# Patient Record
Sex: Female | Born: 1950 | Race: Black or African American | Hispanic: No | Smoking: Never smoker
Health system: Southern US, Community
[De-identification: ages and names within clinical notes are randomized; demographics above are authoritative.]

## PROBLEM LIST (undated history)

## (undated) DIAGNOSIS — D649 Anemia, unspecified: Secondary | ICD-10-CM

## (undated) DIAGNOSIS — B029 Zoster without complications: Secondary | ICD-10-CM

## (undated) DIAGNOSIS — M1990 Unspecified osteoarthritis, unspecified site: Secondary | ICD-10-CM

## (undated) DIAGNOSIS — K219 Gastro-esophageal reflux disease without esophagitis: Secondary | ICD-10-CM

## (undated) DIAGNOSIS — Z9889 Other specified postprocedural states: Secondary | ICD-10-CM

## (undated) DIAGNOSIS — E7800 Pure hypercholesterolemia, unspecified: Secondary | ICD-10-CM

## (undated) DIAGNOSIS — I447 Left bundle-branch block, unspecified: Secondary | ICD-10-CM

## (undated) DIAGNOSIS — I10 Essential (primary) hypertension: Secondary | ICD-10-CM

## (undated) DIAGNOSIS — R112 Nausea with vomiting, unspecified: Secondary | ICD-10-CM

## (undated) HISTORY — PX: CHOLECYSTECTOMY: SHX55

## (undated) HISTORY — PX: BACK SURGERY: SHX140

## (undated) HISTORY — PX: ABDOMINAL HYSTERECTOMY: SHX81

## (undated) HISTORY — PX: VASCULAR SURGERY: SHX849

## (undated) HISTORY — DX: Left bundle-branch block, unspecified: I44.7

---

## 1990-10-11 HISTORY — PX: VASCULAR SURGERY: SHX849

## 1993-10-11 HISTORY — PX: ABDOMINAL HYSTERECTOMY: SHX81

## 1998-10-11 HISTORY — PX: CHOLECYSTECTOMY: SHX55

## 2001-05-19 ENCOUNTER — Ambulatory Visit (HOSPITAL_COMMUNITY): Admission: RE | Admit: 2001-05-19 | Discharge: 2001-05-19 | Payer: Self-pay | Admitting: Obstetrics and Gynecology

## 2001-05-19 ENCOUNTER — Encounter: Payer: Self-pay | Admitting: Obstetrics and Gynecology

## 2001-07-23 ENCOUNTER — Emergency Department (HOSPITAL_COMMUNITY): Admission: EM | Admit: 2001-07-23 | Discharge: 2001-07-23 | Payer: Self-pay | Admitting: Internal Medicine

## 2002-06-22 ENCOUNTER — Ambulatory Visit (HOSPITAL_COMMUNITY): Admission: RE | Admit: 2002-06-22 | Discharge: 2002-06-22 | Payer: Self-pay | Admitting: Family Medicine

## 2002-06-22 ENCOUNTER — Encounter: Payer: Self-pay | Admitting: Family Medicine

## 2002-12-05 ENCOUNTER — Emergency Department (HOSPITAL_COMMUNITY): Admission: EM | Admit: 2002-12-05 | Discharge: 2002-12-05 | Payer: Self-pay | Admitting: Emergency Medicine

## 2003-06-28 ENCOUNTER — Ambulatory Visit (HOSPITAL_COMMUNITY): Admission: RE | Admit: 2003-06-28 | Discharge: 2003-06-28 | Payer: Self-pay | Admitting: Family Medicine

## 2004-07-06 ENCOUNTER — Ambulatory Visit (HOSPITAL_COMMUNITY): Admission: RE | Admit: 2004-07-06 | Discharge: 2004-07-06 | Payer: Self-pay | Admitting: Family Medicine

## 2005-09-27 ENCOUNTER — Ambulatory Visit (HOSPITAL_COMMUNITY): Admission: RE | Admit: 2005-09-27 | Discharge: 2005-09-27 | Payer: Self-pay | Admitting: Internal Medicine

## 2005-10-01 ENCOUNTER — Ambulatory Visit (HOSPITAL_COMMUNITY): Admission: RE | Admit: 2005-10-01 | Discharge: 2005-10-01 | Payer: Self-pay | Admitting: Internal Medicine

## 2005-10-25 ENCOUNTER — Ambulatory Visit (HOSPITAL_COMMUNITY): Admission: RE | Admit: 2005-10-25 | Discharge: 2005-10-26 | Payer: Self-pay | Admitting: Neurosurgery

## 2005-11-09 ENCOUNTER — Encounter (HOSPITAL_COMMUNITY): Admission: RE | Admit: 2005-11-09 | Discharge: 2005-12-09 | Payer: Self-pay | Admitting: Neurosurgery

## 2005-12-14 ENCOUNTER — Encounter (HOSPITAL_COMMUNITY): Admission: RE | Admit: 2005-12-14 | Discharge: 2006-01-13 | Payer: Self-pay | Admitting: Neurosurgery

## 2006-05-07 ENCOUNTER — Emergency Department (HOSPITAL_COMMUNITY): Admission: EM | Admit: 2006-05-07 | Discharge: 2006-05-07 | Payer: Self-pay | Admitting: Emergency Medicine

## 2006-08-08 ENCOUNTER — Ambulatory Visit (HOSPITAL_COMMUNITY): Admission: RE | Admit: 2006-08-08 | Discharge: 2006-08-08 | Payer: Self-pay | Admitting: Internal Medicine

## 2006-10-17 ENCOUNTER — Other Ambulatory Visit: Admission: RE | Admit: 2006-10-17 | Discharge: 2006-10-17 | Payer: Self-pay | Admitting: General Surgery

## 2006-10-18 ENCOUNTER — Ambulatory Visit (HOSPITAL_COMMUNITY): Admission: RE | Admit: 2006-10-18 | Discharge: 2006-10-18 | Payer: Self-pay | Admitting: Internal Medicine

## 2006-10-20 ENCOUNTER — Ambulatory Visit (HOSPITAL_COMMUNITY): Admission: RE | Admit: 2006-10-20 | Discharge: 2006-10-20 | Payer: Self-pay | Admitting: Orthopaedic Surgery

## 2006-12-22 ENCOUNTER — Other Ambulatory Visit: Admission: RE | Admit: 2006-12-22 | Discharge: 2006-12-22 | Payer: Self-pay | Admitting: General Surgery

## 2006-12-22 ENCOUNTER — Encounter (INDEPENDENT_AMBULATORY_CARE_PROVIDER_SITE_OTHER): Payer: Self-pay | Admitting: *Deleted

## 2007-01-27 ENCOUNTER — Ambulatory Visit (HOSPITAL_COMMUNITY): Admission: RE | Admit: 2007-01-27 | Discharge: 2007-01-27 | Payer: Self-pay | Admitting: Internal Medicine

## 2007-07-19 ENCOUNTER — Ambulatory Visit (HOSPITAL_COMMUNITY): Admission: RE | Admit: 2007-07-19 | Discharge: 2007-07-19 | Payer: Self-pay | Admitting: Orthopaedic Surgery

## 2007-11-16 ENCOUNTER — Ambulatory Visit (HOSPITAL_COMMUNITY): Admission: RE | Admit: 2007-11-16 | Discharge: 2007-11-16 | Payer: Self-pay | Admitting: Internal Medicine

## 2007-12-13 ENCOUNTER — Emergency Department (HOSPITAL_COMMUNITY): Admission: EM | Admit: 2007-12-13 | Discharge: 2007-12-13 | Payer: Self-pay | Admitting: Emergency Medicine

## 2008-05-17 ENCOUNTER — Emergency Department (HOSPITAL_COMMUNITY): Admission: EM | Admit: 2008-05-17 | Discharge: 2008-05-17 | Payer: Self-pay | Admitting: Emergency Medicine

## 2008-11-18 ENCOUNTER — Ambulatory Visit (HOSPITAL_COMMUNITY): Admission: RE | Admit: 2008-11-18 | Discharge: 2008-11-18 | Payer: Self-pay | Admitting: Internal Medicine

## 2009-04-30 ENCOUNTER — Encounter: Payer: Self-pay | Admitting: Gastroenterology

## 2009-05-08 ENCOUNTER — Ambulatory Visit: Payer: Self-pay | Admitting: Gastroenterology

## 2009-05-08 ENCOUNTER — Ambulatory Visit (HOSPITAL_COMMUNITY): Admission: RE | Admit: 2009-05-08 | Discharge: 2009-05-08 | Payer: Self-pay | Admitting: Gastroenterology

## 2009-05-08 HISTORY — PX: COLONOSCOPY: SHX174

## 2009-06-11 ENCOUNTER — Emergency Department (HOSPITAL_COMMUNITY): Admission: EM | Admit: 2009-06-11 | Discharge: 2009-06-11 | Payer: Self-pay | Admitting: Emergency Medicine

## 2009-11-20 ENCOUNTER — Ambulatory Visit (HOSPITAL_COMMUNITY)
Admission: RE | Admit: 2009-11-20 | Discharge: 2009-11-20 | Payer: Self-pay | Source: Home / Self Care | Admitting: Internal Medicine

## 2010-05-21 ENCOUNTER — Emergency Department (HOSPITAL_COMMUNITY): Admission: EM | Admit: 2010-05-21 | Discharge: 2010-05-22 | Payer: Self-pay | Admitting: Emergency Medicine

## 2010-10-30 ENCOUNTER — Other Ambulatory Visit (HOSPITAL_COMMUNITY): Payer: Self-pay | Admitting: Internal Medicine

## 2010-10-30 DIAGNOSIS — Z1239 Encounter for other screening for malignant neoplasm of breast: Secondary | ICD-10-CM

## 2010-11-23 ENCOUNTER — Ambulatory Visit (HOSPITAL_COMMUNITY)
Admission: RE | Admit: 2010-11-23 | Discharge: 2010-11-23 | Disposition: A | Discharge status: Home / Self Care | Payer: BC Managed Care – PPO | Source: Ambulatory Visit | Attending: Internal Medicine | Admitting: Internal Medicine

## 2010-11-23 DIAGNOSIS — Z1231 Encounter for screening mammogram for malignant neoplasm of breast: Secondary | ICD-10-CM | POA: Insufficient documentation

## 2010-11-23 DIAGNOSIS — Z1239 Encounter for other screening for malignant neoplasm of breast: Secondary | ICD-10-CM

## 2010-12-25 LAB — CBC
Hemoglobin: 12.1 g/dL (ref 12.0–15.0)
MCHC: 35.3 g/dL (ref 30.0–36.0)
RDW: 13.3 % (ref 11.5–15.5)
WBC: 5.9 10*3/uL (ref 4.0–10.5)

## 2010-12-25 LAB — DIFFERENTIAL
Basophils Absolute: 0 10*3/uL (ref 0.0–0.1)
Basophils Relative: 1 % (ref 0–1)
Neutro Abs: 2.6 10*3/uL (ref 1.7–7.7)
Neutrophils Relative %: 44 % (ref 43–77)

## 2010-12-25 LAB — URINALYSIS, ROUTINE W REFLEX MICROSCOPIC
Nitrite: NEGATIVE
Specific Gravity, Urine: 1.015 (ref 1.005–1.030)
Urobilinogen, UA: 0.2 mg/dL (ref 0.0–1.0)

## 2010-12-25 LAB — BASIC METABOLIC PANEL
BUN: 7 mg/dL (ref 6–23)
Calcium: 9.5 mg/dL (ref 8.4–10.5)
GFR calc non Af Amer: >60 mL/min (ref >60)
Glucose, Bld: 109 mg/dL — ABNORMAL HIGH (ref 70–99)
Sodium: 141 mEq/L (ref 135–145)

## 2011-02-23 NOTE — Op Note (Signed)
Jamie Hunt, Jamie Hunt               ACCOUNT NO.:  [id number]   MEDICAL RECORD NO.:  [id number]          PATIENT TYPE:  AMB   LOCATION:  DAY                           FACILITY:  [facility]   PHYSICIAN:  Kassie Mends, M.D.      DATE OF BIRTH:  1951/04/27   DATE OF PROCEDURE:  05/08/2009  DATE OF DISCHARGE:                               OPERATIVE REPORT   REFERRING PHYSICIAN:  [name] D. Felecia Shelling, MD   PROCEDURE:  Colonoscopy.   INDICATION FOR EXAM:  [name] is a 60 year old female, who presents  for average risk colon cancer screening.   FINDINGS:  1. A few scattered diverticula throughout the entire colon.  Small      internal hemorrhoids.  Otherwise, no polyps, masses, inflammatory      changes, or AVMs seen.  Normal retroflexed view of the rectum.   RECOMMENDATIONS:  1. Screening colonoscopy in 10 years.  2. She should follow a high-fiber diet.  She is given a handout on      high-fiber diet, diverticulosis, and hemorrhoids.   MEDICATIONS:  1. Demerol 75 mg IV.  2. Versed 4 mg IV.   PROCEDURE TECHNIQUE:  Physical exam was performed.  Informed consent was  obtained from the patient after explaining the benefits, risks, and  alternatives to the procedure.  The patient was connected to the monitor  and placed in left lateral position.  Continuous oxygen was provided by  nasal cannula.  IV medicine administered through an indwelling cannula.  After administration of sedation and rectal exam, the patient's rectum  was intubated  and scope was advanced under direct visualization to the cecum.  The  scope was removed slowly by carefully examining the color, texture,  anatomy, and integrity of the mucosa on the way out.  The patient was  recovered in endoscopy and discharged home in satisfactory condition.      Kassie Mends, M.D.  Electronically Signed     SM/MEDQ  D:  05/08/2009  T:  05/08/2009  Job:  [id number]   cc:   [name] D. Felecia Shelling, MD  Fax: [phone]

## 2011-02-26 NOTE — Op Note (Signed)
Jamie Hunt, Jamie Hunt NO.:  [id number]   MEDICAL RECORD NO.:  [id number]          PATIENT TYPE:  OIB   LOCATION:  3018                         FACILITY:  [facility]   PHYSICIAN:  Donalee Citrin, M.D.        DATE OF BIRTH:  1951-05-06   DATE OF PROCEDURE:  10/25/2005  DATE OF DISCHARGE:                                 OPERATIVE REPORT   PREOPERATIVE DIAGNOSIS:  Left S1 radiculopathy from a large ruptured disc L5-  S1, left.   POSTOPERATIVE DIAGNOSIS:  Left S1 radiculopathy from a large ruptured disc  L5-S1, left.   PROCEDURE:  Lumbar laminectomy and microdiscectomy at L5-S1, left,  microscopic dissection of the left S1 nerve root.   SURGEON:  Donalee Citrin, M.D.   ASSISTANT:  Tia Alert, MD   ANESTHESIA:  General endotracheal anesthesia.   HISTORY OF PRESENT ILLNESS:  The patient is a very pleasant 60 year old  female who has had long-standing back and left leg pain radiating down the  outside of her leg and bottom of her foot with numbness and tingling in the  same distribution.  Preoperative imaging showed a very large ruptured disc  at L5-S1 compressing the left S1 nerve root.  The patient has failed  conservative treatment and is recommended laminectomy and microdiscectomy.  I discussed the risks and benefits of the surgery which she understood and  agreed to proceed forth.   DESCRIPTION OF PROCEDURE:  The patient was brought to the operating room,  given general anesthesia, positioned prone on the Wilson frame.  The back  was prepped and draped in the usual sterile fashion.  After a preop x-ray  localized the L5-S1 disc space and infiltrated with 10 mL of lidocaine with  epinephrine, a midline incision was made.  Bovie electrocautery was used to  dissect through the subcutaneous tissue and subperiosteal dissection was  carried out at the lamina of L5 and S1 on the left side.  A self-retaining  retractor was placed.  Interoperative x-ray confirmed localization  of the  appropriate level.  Then, the inferior aspect of L5, medial facet complex,  and superior aspect of the lamina of S1 was removed in a piecemeal fashion.  The ligamentum flavum was identified and also removed in a piecemeal fashion  exposing the thecal sac and proximal S1 nerve root.  At this point, the  operating microscope was draped and brought onto the field.  Under  microscopic illumination, the S1 nerve root was dissected free and mobilized  by underbiting the lateral gutter and the lateral ligament.  Then, part of  the root of the foramen was underbitten down the S1 lamina and the S1  pedicle was identified.  Using a nerve hook and a Penfield 4, the S1 nerve  root was dissected off a very large fragment of disc still contained  partially within the ligament and reflected medially.  Then, the annulotomy  was made and fragments removed that had migrated outside the disc space.  The disc space was entered, the disc was noted to be markedly degenerated  and collapsed.  Several large fragments were removed from the central  compartment and lateral compartment using a downgoing Epstein curet.  The  endplates were scraped.  Additional disc fragments were removed until the  disc was empty.  No further stenosis was appreciated at the end of the  discectomy and there were no other disc fragments appreciated and the S1  nerve root was freely mobilized and mobile after exploration with the  coronary dilator.  The wound was copiously irrigated and meticulous  hemostasis was maintained.  Gelfoam was overlaid on top of the dura.  The  muscle and fascia were  reapproximated with interrupted Vicryl and the skin was closed with running  4-0 subcuticular.  Benzoin and Steri-Strips were applied.  The patient went  to the recovery room in stable condition.  At the end of the case, the  needle, sponge, and instrument counts were correct.           ______________________________  Donalee Citrin,  M.D.     GC/MEDQ  D:  10/25/2005  T:  10/25/2005  Job:  [id number]

## 2011-07-05 LAB — URINALYSIS, ROUTINE W REFLEX MICROSCOPIC
Protein, ur: NEGATIVE
Urobilinogen, UA: 0.2

## 2011-07-09 LAB — DIFFERENTIAL
Basophils Relative: 0
Monocytes Relative: 7
Neutro Abs: 5.4
Neutrophils Relative %: 73

## 2011-07-09 LAB — BASIC METABOLIC PANEL
CO2: 29
Calcium: 9.4
Creatinine, Ser: 0.89
GFR calc Af Amer: >60

## 2011-07-09 LAB — CBC
MCHC: 34.4
RBC: 3.54 — ABNORMAL LOW
WBC: 7.4

## 2011-07-22 LAB — POCT I-STAT CREATININE
Creatinine, Ser: 0.9
Operator id: 218581

## 2011-10-27 ENCOUNTER — Other Ambulatory Visit (HOSPITAL_COMMUNITY): Payer: Self-pay | Admitting: Internal Medicine

## 2011-10-27 DIAGNOSIS — Z139 Encounter for screening, unspecified: Secondary | ICD-10-CM

## 2011-11-29 ENCOUNTER — Ambulatory Visit (HOSPITAL_COMMUNITY)
Admission: RE | Admit: 2011-11-29 | Discharge: 2011-11-29 | Disposition: A | Discharge status: Home / Self Care | Payer: BC Managed Care – PPO | Source: Ambulatory Visit | Attending: Internal Medicine | Admitting: Internal Medicine

## 2011-11-29 DIAGNOSIS — Z1231 Encounter for screening mammogram for malignant neoplasm of breast: Secondary | ICD-10-CM | POA: Insufficient documentation

## 2011-11-29 DIAGNOSIS — Z139 Encounter for screening, unspecified: Secondary | ICD-10-CM

## 2011-11-30 ENCOUNTER — Emergency Department (HOSPITAL_COMMUNITY)
Admission: EM | Admit: 2011-11-30 | Discharge: 2011-11-30 | Disposition: A | Discharge status: Home / Self Care | Payer: BC Managed Care – PPO | Attending: Emergency Medicine | Admitting: Emergency Medicine

## 2011-11-30 ENCOUNTER — Encounter (HOSPITAL_COMMUNITY): Payer: Self-pay

## 2011-11-30 DIAGNOSIS — I10 Essential (primary) hypertension: Secondary | ICD-10-CM | POA: Insufficient documentation

## 2011-11-30 DIAGNOSIS — K529 Noninfective gastroenteritis and colitis, unspecified: Secondary | ICD-10-CM

## 2011-11-30 DIAGNOSIS — E7800 Pure hypercholesterolemia, unspecified: Secondary | ICD-10-CM | POA: Insufficient documentation

## 2011-11-30 DIAGNOSIS — M129 Arthropathy, unspecified: Secondary | ICD-10-CM | POA: Insufficient documentation

## 2011-11-30 DIAGNOSIS — K5289 Other specified noninfective gastroenteritis and colitis: Secondary | ICD-10-CM | POA: Insufficient documentation

## 2011-11-30 HISTORY — DX: Unspecified osteoarthritis, unspecified site: M19.90

## 2011-11-30 HISTORY — DX: Essential (primary) hypertension: I10

## 2011-11-30 HISTORY — DX: Zoster without complications: B02.9

## 2011-11-30 HISTORY — DX: Pure hypercholesterolemia, unspecified: E78.00

## 2011-11-30 LAB — BASIC METABOLIC PANEL
BUN: 15 mg/dL (ref 6–23)
CO2: 25 mEq/L (ref 19–32)
Chloride: 105 mEq/L (ref 96–112)
Creatinine, Ser: 0.79 mg/dL (ref 0.50–1.10)
Glucose, Bld: 108 mg/dL — ABNORMAL HIGH (ref 70–99)
Potassium: 3.6 mEq/L (ref 3.5–5.1)

## 2011-11-30 LAB — DIFFERENTIAL
Basophils Relative: 0 % (ref 0–1)
Lymphocytes Relative: 8 % — ABNORMAL LOW (ref 12–46)
Lymphs Abs: 0.6 10*3/uL — ABNORMAL LOW (ref 0.7–4.0)
Monocytes Absolute: 0.4 10*3/uL (ref 0.1–1.0)
Monocytes Relative: 5 % (ref 3–12)
Neutro Abs: 6.7 10*3/uL (ref 1.7–7.7)
Neutrophils Relative %: 87 % — ABNORMAL HIGH (ref 43–77)

## 2011-11-30 LAB — CBC
HCT: 36.6 % (ref 36.0–46.0)
Hemoglobin: 13.1 g/dL (ref 12.0–15.0)
MCHC: 35.8 g/dL (ref 30.0–36.0)
RBC: 3.89 MIL/uL (ref 3.87–5.11)

## 2011-11-30 LAB — URINE MICROSCOPIC-ADD ON

## 2011-11-30 LAB — URINALYSIS, ROUTINE W REFLEX MICROSCOPIC
Bilirubin Urine: NEGATIVE
Glucose, UA: NEGATIVE mg/dL
Ketones, ur: NEGATIVE mg/dL
Specific Gravity, Urine: 1.02 (ref 1.005–1.030)
pH: 6 (ref 5.0–8.0)

## 2011-11-30 MED ORDER — ONDANSETRON HCL 4 MG/2ML IJ SOLN
4.0000 mg | Freq: Once | INTRAMUSCULAR | Status: AC
  Administered 2011-11-30: 4 mg via INTRAVENOUS
  Filled 2011-11-30: qty 2

## 2011-11-30 MED ORDER — SODIUM CHLORIDE 0.9 % IV SOLN: Freq: Once | INTRAVENOUS | Status: DC

## 2011-11-30 MED ORDER — SODIUM CHLORIDE 0.9 % IV BOLUS (SEPSIS)
1000.0000 mL | Freq: Once | INTRAVENOUS | Status: AC
  Administered 2011-11-30: 1000 mL via INTRAVENOUS

## 2011-11-30 MED ORDER — LOPERAMIDE HCL 2 MG PO CAPS
4.0000 mg | ORAL_CAPSULE | Freq: Once | ORAL | Status: AC
  Administered 2011-11-30: 4 mg via ORAL
  Filled 2011-11-30: qty 2

## 2011-11-30 MED ORDER — METOCLOPRAMIDE HCL 10 MG PO TABS: 10.0000 mg | ORAL_TABLET | Freq: Four times a day (QID) | ORAL | Status: AC | PRN

## 2011-11-30 NOTE — ED Notes (Signed)
Awaiting IVF bolus to complete prior to discharge. Patient aware. Ginger ale provided to patient per request. Denies any other needs at this time.

## 2011-11-30 NOTE — ED Provider Notes (Signed)
History     CSN: [id number]  Arrival date & time 11/30/11  3244   First MD Initiated Contact with Patient 11/30/11 [phone]      Chief Complaint  Patient presents with  . Emesis  . Diarrhea    (Consider location/radiation/quality/duration/timing/severity/associated sxs/prior treatment) Patient is a 61 y.o. female presenting with vomiting and diarrhea. The history is provided by the patient.  Emesis  Associated symptoms include diarrhea.  Diarrhea The primary symptoms include vomiting and diarrhea.  She started having vomiting and diarrhea at midnight. She has vomited 3 times and has had multiple watery bowel movements. There've been no fever, chills, and sweats. She has had crampy upper abdominal pain which is temporarily relieved by vomiting or moving her bowels. Pain was 6/10 at its worst and is 0/10 currently. Next is symptoms better nothing makes them worse. She was not aware of any sick contacts and has not aware of eating any tainted food. She describes her symptoms as severe.  Past Medical History  Diagnosis Date  . Hypertension   . Arthritis   . High cholesterol   . Shingles     Past Surgical History  Procedure Date  . Abdominal hysterectomy   . Back surgery   . Cholecystectomy   . Vascular surgery     No family history on file.  History  Substance Use Topics  . Smoking status: Never Smoker   . Smokeless tobacco: Not on file  . Alcohol Use: No    OB History    Grav Para Term Preterm Abortions TAB SAB Ect Mult Living                  Review of Systems  Gastrointestinal: Positive for vomiting and diarrhea.  All other systems reviewed and are negative.    Allergies  Penicillins  Home Medications   Current Outpatient Rx  Name Route Sig Dispense Refill  . FAMCICLOVIR 250 MG PO TABS Oral Take 250 mg by mouth 2 (two) times daily.    Marland Kitchen FEXOFENADINE HCL 180 MG PO TABS Oral Take 180 mg by mouth daily.    Marland Kitchen HYDROCHLOROTHIAZIDE 25 MG PO TABS Oral Take 25  mg by mouth daily.    Marland Kitchen HYDROCODONE-ACETAMINOPHEN 7.5-325 MG PO TABS Oral Take 1 tablet by mouth 2 (two) times daily as needed.    Marland Kitchen LOVASTATIN 20 MG PO TABS Oral Take 20 mg by mouth at bedtime.    Marland Kitchen NABUMETONE 750 MG PO TABS Oral Take 750 mg by mouth 2 (two) times daily.    Marland Kitchen OMEPRAZOLE 20 MG PO CPDR Oral Take 20 mg by mouth daily.    . QUINAPRIL HCL 40 MG PO TABS Oral Take 40 mg by mouth at bedtime.    Marland Kitchen VERAPAMIL HCL 120 MG PO TABS Oral Take 240 mg by mouth daily.      BP 137/84  Pulse 94  Temp(Src) 98.3 F (36.8 C) (Oral)  Resp 16  Ht 5\' 10"  (1.778 m)  Wt 213 lb (96.616 kg)  BMI 30.56 kg/m2  SpO2 98%  Physical Exam  Nursing note and vitals reviewed.  61 year old female is resting comfortably and in no acute distress. Vital signs are normal. Oxygen saturation is 98% which is normal. Head is normocephalic and atraumatic. PERRLA, EOMI. Oropharynx is clear mucous membranes are moist. Neck is nontender and supple without adenopathy or JVD. Back is nontender. There's no CVA tenderness. Lungs are clear without rales, wheezes, or rhonchi. Heart has regular rate and rhythm  without murmur. Abdomen is soft, flat, with mild tenderness in the epigastric area. There is no rebound or guarding. There is no masses or hepatosplenomegaly. Bowel sounds are diminished. Extremities have no cyanosis or edema, full range of motion is present. Skin is warm and dry without rash. Neurologic: Mental status is normal, cranial nerves are intact, there no focal motor or sensory deficits.  ED Course  Procedures (including critical care time)  Results for orders placed during the hospital encounter of 11/30/11  CBC      Component Value Range   WBC 7.7  4.0 - 10.5 (K/uL)   RBC 3.89  3.87 - 5.11 (MIL/uL)   Hemoglobin 13.1  12.0 - 15.0 (g/dL)   HCT 16.1  09.6 - 04.5 (%)   MCV 94.1  78.0 - 100.0 (fL)   MCH 33.7  26.0 - 34.0 (pg)   MCHC 35.8  30.0 - 36.0 (g/dL)   RDW 40.9  81.1 - 91.4 (%)   Platelets 238  150 -  400 (K/uL)  DIFFERENTIAL      Component Value Range   Neutrophils Relative 87 (*) 43 - 77 (%)   Neutro Abs 6.7  1.7 - 7.7 (K/uL)   Lymphocytes Relative 8 (*) 12 - 46 (%)   Lymphs Abs 0.6 (*) 0.7 - 4.0 (K/uL)   Monocytes Relative 5  3 - 12 (%)   Monocytes Absolute 0.4  0.1 - 1.0 (K/uL)   Eosinophils Relative 0  0 - 5 (%)   Eosinophils Absolute 0.0  0.0 - 0.7 (K/uL)   Basophils Relative 0  0 - 1 (%)   Basophils Absolute 0.0  0.0 - 0.1 (K/uL)  BASIC METABOLIC PANEL      Component Value Range   Sodium 142  135 - 145 (mEq/L)   Potassium 3.6  3.5 - 5.1 (mEq/L)   Chloride 105  96 - 112 (mEq/L)   CO2 25  19 - 32 (mEq/L)   Glucose, Bld 108 (*) 70 - 99 (mg/dL)   BUN 15  6 - 23 (mg/dL)   Creatinine, Ser 7.82  0.50 - 1.10 (mg/dL)   Calcium 9.8  8.4 - 95.6 (mg/dL)   GFR calc non Af Amer 89 (*) >90 (mL/min)   GFR calc Af Amer >90  >90 (mL/min)  URINALYSIS, ROUTINE W REFLEX MICROSCOPIC      Component Value Range   Color, Urine YELLOW  YELLOW    APPearance CLEAR  CLEAR    Specific Gravity, Urine 1.020  1.005 - 1.030    pH 6.0  5.0 - 8.0    Glucose, UA NEGATIVE  NEGATIVE (mg/dL)   Hgb urine dipstick NEGATIVE  NEGATIVE    Bilirubin Urine NEGATIVE  NEGATIVE    Ketones, ur NEGATIVE  NEGATIVE (mg/dL)   Protein, ur NEGATIVE  NEGATIVE (mg/dL)   Urobilinogen, UA 0.2  0.0 - 1.0 (mg/dL)   Nitrite NEGATIVE  NEGATIVE    Leukocytes, UA SMALL (*) NEGATIVE   URINE MICROSCOPIC-ADD ON      Component Value Range   Squamous Epithelial / LPF RARE  RARE    WBC, UA 3-6  <3 (WBC/hpf)   She feels much better after IV hydration and IV Zofran. Shows received oral loperamide. She will be sent home with a prescription for metoclopramide and is advised to take oral loperamide as needed for diarrhea.  1. Gastroenteritis       MDM  Vomiting and diarrhea which most likely represents a viral gastroenteritis. She will be given  IV hydration, IV ondansetron, and oral loperamide.      Started having vomiting  and diarrhea  Dione Booze, MD 11/30/11 [phone]

## 2011-11-30 NOTE — ED Notes (Signed)
Patient with no complaints at this time. Respirations even and unlabored. Skin warm/dry. Discharge instructions reviewed with patient at this time. Patient given opportunity to voice concerns/ask questions. IV removed per policy and band-aid applied to site. Patient discharged at this time and left Emergency Department with steady gait.  

## 2011-11-30 NOTE — Discharge Instructions (Signed)
Take Imodium AD as needed for diarrhea.  Nausea and Vomiting Nausea is a sick feeling that often comes before throwing up (vomiting). Vomiting is a reflex where stomach contents come out of your mouth. Vomiting can cause severe loss of body fluids (dehydration). Children and elderly adults can become dehydrated quickly, especially if they also have diarrhea. Nausea and vomiting are symptoms of a condition or disease. It is important to find the cause of your symptoms. CAUSES   Direct irritation of the stomach lining. This irritation can result from increased acid production (gastroesophageal reflux disease), infection, food poisoning, taking certain medicines (such as nonsteroidal anti-inflammatory drugs), alcohol use, or tobacco use.   Signals from the brain.These signals could be caused by a headache, heat exposure, an inner ear disturbance, increased pressure in the brain from injury, infection, a tumor, or a concussion, pain, emotional stimulus, or metabolic problems.   An obstruction in the gastrointestinal tract (bowel obstruction).   Illnesses such as diabetes, hepatitis, gallbladder problems, appendicitis, kidney problems, cancer, sepsis, atypical symptoms of a heart attack, or eating disorders.   Medical treatments such as chemotherapy and radiation.   Receiving medicine that makes you sleep (general anesthetic) during surgery.  DIAGNOSIS Your caregiver may ask for tests to be done if the problems do not improve after a few days. Tests may also be done if symptoms are severe or if the reason for the nausea and vomiting is not clear. Tests may include:  Urine tests.   Blood tests.   Stool tests.   Cultures (to look for evidence of infection).   X-rays or other imaging studies.  Test results can help your caregiver make decisions about treatment or the need for additional tests. TREATMENT You need to stay well hydrated. Drink frequently but in small amounts.You may wish to  drink water, sports drinks, clear broth, or eat frozen ice pops or gelatin dessert to help stay hydrated.When you eat, eating slowly may help prevent nausea.There are also some antinausea medicines that may help prevent nausea. HOME CARE INSTRUCTIONS   Take all medicine as directed by your caregiver.   If you do not have an appetite, do not force yourself to eat. However, you must continue to drink fluids.   If you have an appetite, eat a normal diet unless your caregiver tells you differently.   Eat a variety of complex carbohydrates (rice, wheat, potatoes, bread), lean meats, yogurt, fruits, and vegetables.   Avoid high-fat foods because they are more difficult to digest.   Drink enough water and fluids to keep your urine clear or pale yellow.   If you are dehydrated, ask your caregiver for specific rehydration instructions. Signs of dehydration may include:   Severe thirst.   Dry lips and mouth.   Dizziness.   Dark urine.   Decreasing urine frequency and amount.   Confusion.   Rapid breathing or pulse.  SEEK IMMEDIATE MEDICAL CARE IF:   You have blood or brown flecks (like coffee grounds) in your vomit.   You have black or bloody stools.   You have a severe headache or stiff neck.   You are confused.   You have severe abdominal pain.   You have chest pain or trouble breathing.   You do not urinate at least once every 8 hours.   You develop cold or clammy skin.   You continue to vomit for longer than 24 to 48 hours.   You have a fever.  MAKE SURE YOU:  Understand these instructions.   Will watch your condition.   Will get help right away if you are not doing well or get worse.  Document Released: 09/27/2005 Document Revised: 06/09/2011 Document Reviewed: 02/24/2011 [facility] Patient Information 2012 [place], [place].  Diarrhea Infections caused by germs (bacterial) or a virus commonly cause diarrhea. Your caregiver has determined that with time, rest  and fluids, the diarrhea should improve. In general, eat normally while drinking more water than usual. Although water may prevent dehydration, it does not contain salt and minerals (electrolytes). Broths, weak tea without caffeine and oral rehydration solutions (ORS) replace fluids and electrolytes. Small amounts of fluids should be taken frequently. Large amounts at one time may not be tolerated. Plain water may be harmful in infants and the elderly. Oral rehydrating solutions (ORS) are available at pharmacies and grocery stores. ORS replace water and important electrolytes in proper proportions. Sports drinks are not as effective as ORS and may be harmful due to sugars worsening diarrhea.  ORS is especially recommended for use in children with diarrhea. As a general guideline for children, replace any new fluid losses from diarrhea and/or vomiting with ORS as follows:   If your child weighs 22 pounds or under (10 kg or less), give 60-120 mL ( -  cup or 2 - 4 ounces) of ORS for each episode of diarrheal stool or vomiting episode.   If your child weighs more than 22 pounds (more than 10 kgs), give 120-240 mL ( - 1 cup or 4 - 8 ounces) of ORS for each diarrheal stool or episode of vomiting.   While correcting for dehydration, children should eat normally. However, foods high in sugar should be avoided because this may worsen diarrhea. Large amounts of carbonated soft drinks, juice, gelatin desserts and other highly sugared drinks should be avoided.   After correction of dehydration, other liquids that are appealing to the child may be added. Children should drink small amounts of fluids frequently and fluids should be increased as tolerated. Children should drink enough fluids to keep urine clear or pale yellow.   Adults should eat normally while drinking more fluids than usual. Drink small amounts of fluids frequently and increase as tolerated. Drink enough fluids to keep urine clear or pale yellow.  Broths, weak decaffeinated tea, lemon lime soft drinks (allowed to go flat) and ORS replace fluids and electrolytes.   Avoid:   Carbonated drinks.   Juice.   Extremely hot or cold fluids.   Caffeine drinks.   Fatty, greasy foods.   Alcohol.   Tobacco.   Too much intake of anything at one time.   Gelatin desserts.   Probiotics are active cultures of beneficial bacteria. They may lessen the amount and number of diarrheal stools in adults. Probiotics can be found in yogurt with active cultures and in supplements.   Wash hands well to avoid spreading bacteria and virus.   Anti-diarrheal medications are not recommended for infants and children.   Only take over-the-counter or prescription medicines for pain, discomfort or fever as directed by your caregiver. Do not give aspirin to children because it may cause Reye's Syndrome.   For adults, ask your caregiver if you should continue all prescribed and over-the-counter medicines.   If your caregiver has given you a follow-up appointment, it is very important to keep that appointment. Not keeping the appointment could result in a chronic or permanent injury, and disability. If there is any problem keeping the appointment, you must call back  to this facility for assistance.  SEEK IMMEDIATE MEDICAL CARE IF:   You or your child is unable to keep fluids down or other symptoms or problems become worse in spite of treatment.   Vomiting or diarrhea develops and becomes persistent.   There is vomiting of blood or bile (green material).   There is blood in the stool or the stools are black and tarry.   There is no urine output in 6-8 hours or there is only a small amount of very dark urine.   Abdominal pain develops, increases or localizes.   You have a fever.   Your baby is older than 3 months with a rectal temperature of 102 F (38.9 C) or higher.   Your baby is 18 months old or younger with a rectal temperature of 100.4 F (38  C) or higher.   You or your child develops excessive weakness, dizziness, fainting or extreme thirst.   You or your child develops a rash, stiff neck, severe headache or become irritable or sleepy and difficult to awaken.  MAKE SURE YOU:   Understand these instructions.   Will watch your condition.   Will get help right away if you are not doing well or get worse.  Document Released: 09/17/2002 Document Revised: 06/09/2011 Document Reviewed: 08/04/2009 [facility] Patient Information 2012 [place], [place].  Metoclopramide tablets What is this medicine? METOCLOPRAMIDE (met oh kloe PRA mide) is used to treat the symptoms of gastroesophageal reflux disease (GERD) like heartburn. It is also used to treat people with slow emptying of the stomach and intestinal tract. This medicine may be used for other purposes; ask your health care provider or pharmacist if you have questions. What should I tell my health care provider before I take this medicine? They need to know if you have any of these conditions: -breast cancer -depression -diabetes -heart failure -high blood pressure -kidney disease -liver disease -Parkinson's disease or a movement disorder -pheochromocytoma -seizures -stomach obstruction, bleeding, or perforation -an unusual or allergic reaction to metoclopramide, procainamide, sulfites, other medicines, foods, dyes, or preservatives -pregnant or trying to get pregnant -breast-feeding How should I use this medicine? Take this medicine by mouth with a glass of water. Follow the directions on the prescription label. Take this medicine on an empty stomach, about 30 minutes before eating. Take your doses at regular intervals. Do not take your medicine more often than directed. Do not stop taking except on the advice of your doctor or health care professional. A special MedGuide will be given to you by the pharmacist with each prescription and refill. Be sure to read this information  carefully each time. Talk to your pediatrician regarding the use of this medicine in children. Special care may be needed. Overdosage: If you think you have taken too much of this medicine contact a poison control center or emergency room at once. NOTE: This medicine is only for you. Do not share this medicine with others. What if I miss a dose? If you miss a dose, take it as soon as you can. If it is almost time for your next dose, take only that dose. Do not take double or extra doses. What may interact with this medicine? -acetaminophen -cyclosporine -digoxin -medicines for blood pressure -medicines for diabetes, including insulin -medicines for hay fever and other allergies -medicines for depression, especially an Monoamine Oxidase Inhibitor (MAOI) -medicines for Parkinson's disease, like levodopa -medicines for sleep or for pain -tetracycline This list may not describe all possible interactions. Give  your health care provider a list of all the medicines, herbs, non-prescription drugs, or dietary supplements you use. Also tell them if you smoke, drink alcohol, or use illegal drugs. Some items may interact with your medicine. What should I watch for while using this medicine? It may take a few weeks for your stomach condition to start to get better. However, do not take this medicine for longer than 12 weeks. The longer you take this medicine, and the more you take it, the greater your chances are of developing serious side effects. If you are an elderly patient, a female patient, or you have diabetes, you may be at an increased risk for side effects from this medicine. Contact your doctor immediately if you start having movements you cannot control such as lip smacking, rapid movements of the tongue, involuntary or uncontrollable movements of the eyes, head, arms and legs, or muscle twitches and spasms. Patients and their families should watch out for worsening depression or thoughts of  suicide. Also watch out for any sudden or severe changes in feelings such as feeling anxious, agitated, panicky, irritable, hostile, aggressive, impulsive, severely restless, overly excited and hyperactive, or not being able to sleep. If this happens, especially at the beginning of treatment or after a change in dose, call your doctor. Do not treat yourself for high fever. Ask your doctor or health care professional for advice. You may get drowsy or dizzy. Do not drive, use machinery, or do anything that needs mental alertness until you know how this drug affects you. Do not stand or sit up quickly, especially if you are an older patient. This reduces the risk of dizzy or fainting spells. Alcohol can make you more drowsy and dizzy. Avoid alcoholic drinks. What side effects may I notice from receiving this medicine? Side effects that you should report to your doctor or health care professional as soon as possible: -allergic reactions like skin rash, itching or hives, swelling of the face, lips, or tongue -abnormal production of milk in females -breast enlargement in both males and females -change in the way you walk -difficulty moving, speaking or swallowing -drooling, lip smacking, or rapid movements of the tongue -excessive sweating -fever -involuntary or uncontrollable movements of the eyes, head, arms and legs -irregular heartbeat or palpitations -muscle twitches and spasms -unusually weak or tired Side effects that usually do not require medical attention (report to your doctor or health care professional if they continue or are bothersome): -change in sex drive or performance -depressed mood -diarrhea -difficulty sleeping -headache -menstrual changes -restless or nervous This list may not describe all possible side effects. Call your doctor for medical advice about side effects. You may report side effects to FDA at 1-800-FDA-1088. Where should I keep my medicine? Keep out of the  reach of children. Store at room temperature between 20 and 25 degrees C (68 and 77 degrees F). Protect from light. Keep container tightly closed. Throw away any unused medicine after the expiration date. NOTE: This sheet is a summary. It may not cover all possible information. If you have questions about this medicine, talk to your doctor, pharmacist, or health care provider.  2012, [employer]. (05/22/2008 4:30:05 PM)

## 2011-11-30 NOTE — ED Notes (Signed)
Nausea, vomiting, diarrhea that started around midnight per pt. Having some abdominal pain per pt.

## 2012-10-11 HISTORY — PX: BACK SURGERY: SHX140

## 2012-11-01 ENCOUNTER — Other Ambulatory Visit (HOSPITAL_COMMUNITY): Payer: Self-pay | Admitting: Internal Medicine

## 2012-11-01 DIAGNOSIS — Z139 Encounter for screening, unspecified: Secondary | ICD-10-CM

## 2012-11-19 ENCOUNTER — Encounter (HOSPITAL_COMMUNITY): Payer: Self-pay

## 2012-11-19 ENCOUNTER — Emergency Department (HOSPITAL_COMMUNITY): Payer: BC Managed Care – PPO

## 2012-11-19 ENCOUNTER — Emergency Department (HOSPITAL_COMMUNITY)
Admission: EM | Admit: 2012-11-19 | Discharge: 2012-11-19 | Disposition: A | Discharge status: Home / Self Care | Payer: BC Managed Care – PPO | Attending: Emergency Medicine | Admitting: Emergency Medicine

## 2012-11-19 DIAGNOSIS — E7800 Pure hypercholesterolemia, unspecified: Secondary | ICD-10-CM | POA: Insufficient documentation

## 2012-11-19 DIAGNOSIS — Z8619 Personal history of other infectious and parasitic diseases: Secondary | ICD-10-CM | POA: Insufficient documentation

## 2012-11-19 DIAGNOSIS — Z8739 Personal history of other diseases of the musculoskeletal system and connective tissue: Secondary | ICD-10-CM | POA: Insufficient documentation

## 2012-11-19 DIAGNOSIS — Z79899 Other long term (current) drug therapy: Secondary | ICD-10-CM | POA: Insufficient documentation

## 2012-11-19 DIAGNOSIS — I10 Essential (primary) hypertension: Secondary | ICD-10-CM | POA: Insufficient documentation

## 2012-11-19 DIAGNOSIS — K219 Gastro-esophageal reflux disease without esophagitis: Secondary | ICD-10-CM | POA: Insufficient documentation

## 2012-11-19 DIAGNOSIS — R079 Chest pain, unspecified: Secondary | ICD-10-CM | POA: Insufficient documentation

## 2012-11-19 HISTORY — DX: Gastro-esophageal reflux disease without esophagitis: K21.9

## 2012-11-19 LAB — CBC WITH DIFFERENTIAL/PLATELET
Basophils Absolute: 0 10*3/uL (ref 0.0–0.1)
Eosinophils Relative: 2 % (ref 0–5)
HCT: 36.1 % (ref 36.0–46.0)
Lymphocytes Relative: 54 % — ABNORMAL HIGH (ref 12–46)
Lymphs Abs: 4.4 10*3/uL — ABNORMAL HIGH (ref 0.7–4.0)
MCV: 95.5 fL (ref 78.0–100.0)
Monocytes Absolute: 0.6 10*3/uL (ref 0.1–1.0)
Neutro Abs: 2.9 10*3/uL (ref 1.7–7.7)
Platelets: 258 10*3/uL (ref 150–400)
RBC: 3.78 MIL/uL — ABNORMAL LOW (ref 3.87–5.11)
RDW: 12.8 % (ref 11.5–15.5)
WBC: 8.1 10*3/uL (ref 4.0–10.5)

## 2012-11-19 LAB — BASIC METABOLIC PANEL
CO2: 26 mEq/L (ref 19–32)
Calcium: 9.4 mg/dL (ref 8.4–10.5)
Chloride: 103 mEq/L (ref 96–112)
Glucose, Bld: 89 mg/dL (ref 70–99)
Sodium: 140 mEq/L (ref 135–145)

## 2012-11-19 LAB — TROPONIN I: Troponin I: <0.3 ng/mL (ref <0.30)

## 2012-11-19 MED ORDER — TRAMADOL HCL 50 MG PO TABS: 50.0000 mg | ORAL_TABLET | Freq: Four times a day (QID) | ORAL | Status: DC | PRN

## 2012-11-19 NOTE — ED Provider Notes (Signed)
History     CSN: [id number]  Arrival date & time 11/19/12  1252   First MD Initiated Contact with Patient 11/19/12 1321      Chief Complaint  Patient presents with  . Chest Pain    (Consider location/radiation/quality/duration/timing/severity/associated sxs/prior treatment) Patient is a 62 y.o. female presenting with chest pain. The history is provided by the patient (the pt complains of left side chest pain with movement). No language interpreter was used.  Chest Pain Pain location:  L chest Pain quality: aching   Pain radiates to:  Does not radiate Pain radiates to the back: no   Pain severity:  Moderate Onset quality:  Gradual Timing:  Intermittent Progression:  Waxing and waning Chronicity:  New Context: not breathing   Associated symptoms: no abdominal pain, no back pain, no cough, no fatigue and no headache     Past Medical History  Diagnosis Date  . Hypertension   . Arthritis   . High cholesterol   . Shingles   . Acid reflux     Past Surgical History  Procedure Laterality Date  . Abdominal hysterectomy    . Back surgery    . Cholecystectomy    . Vascular surgery      No family history on file.  History  Substance Use Topics  . Smoking status: Never Smoker   . Smokeless tobacco: Not on file  . Alcohol Use: No    OB History   Grav Para Term Preterm Abortions TAB SAB Ect Mult Living                  Review of Systems  Constitutional: Negative for fatigue.  HENT: Negative for congestion, sinus pressure and ear discharge.   Eyes: Negative for discharge.  Respiratory: Negative for cough.   Cardiovascular: Positive for chest pain.  Gastrointestinal: Negative for abdominal pain and diarrhea.  Genitourinary: Negative for frequency and hematuria.  Musculoskeletal: Negative for back pain.  Skin: Negative for rash.  Neurological: Negative for seizures and headaches.  Psychiatric/Behavioral: Negative for hallucinations.    Allergies   Penicillins  Home Medications   Current Outpatient Rx  Name  Route  Sig  Dispense  Refill  . famciclovir (FAMVIR) 250 MG tablet   Oral   Take 250 mg by mouth 2 (two) times daily.         . fexofenadine (ALLEGRA) 180 MG tablet   Oral   Take 180 mg by mouth daily.         . hydrALAZINE (APRESOLINE) 25 MG tablet   Oral   Take 25 mg by mouth 2 (two) times daily.         . hydrochlorothiazide (HYDRODIURIL) 25 MG tablet   Oral   Take 25 mg by mouth daily.         Marland Kitchen HYDROcodone-acetaminophen (NORCO) 7.5-325 MG per tablet   Oral   Take 1 tablet by mouth 2 (two) times daily as needed for pain.          Marland Kitchen lovastatin (MEVACOR) 20 MG tablet   Oral   Take 20 mg by mouth at bedtime.         . nabumetone (RELAFEN) 750 MG tablet   Oral   Take 750 mg by mouth 2 (two) times daily.         Marland Kitchen omeprazole (PRILOSEC) 20 MG capsule   Oral   Take 20 mg by mouth daily.         . quinapril (ACCUPRIL)  40 MG tablet   Oral   Take 40 mg by mouth daily.          . verapamil (VERELAN PM) 240 MG 24 hr capsule   Oral   Take 240 mg by mouth daily.         . traMADol (ULTRAM) 50 MG tablet   Oral   Take 1 tablet (50 mg total) by mouth every 6 (six) hours as needed for pain.   20 tablet   0     BP 154/86  Pulse 87  Temp(Src) 98.7 F (37.1 C) (Oral)  Resp 18  Ht 5\' 11"  (1.803 m)  Wt 220 lb (99.791 kg)  BMI 30.7 kg/m2  SpO2 99%  Physical Exam  Constitutional: She is oriented to person, place, and time. She appears well-developed.  HENT:  Head: Normocephalic and atraumatic.  Eyes: Conjunctivae and EOM are normal. No scleral icterus.  Neck: Neck supple. No thyromegaly present.  Cardiovascular: Normal rate and regular rhythm.  Exam reveals no gallop and no friction rub.   No murmur heard. Pulmonary/Chest: No stridor. She has no wheezes. She has no rales. She exhibits tenderness.  Tender left chest  Abdominal: She exhibits no distension. There is no tenderness.  There is no rebound.  Musculoskeletal: Normal range of motion. She exhibits no edema.  Lymphadenopathy:    She has no cervical adenopathy.  Neurological: She is oriented to person, place, and time. Coordination normal.  Skin: No rash noted. No erythema.  Psychiatric: She has a normal mood and affect. Her behavior is normal.    ED Course  Procedures (including critical care time)  Labs Reviewed  CBC WITH DIFFERENTIAL - Abnormal; Notable for the following:    RBC 3.78 (*)    MCH 34.1 (*)    Neutrophils Relative 36 (*)    Lymphocytes Relative 54 (*)    Lymphs Abs 4.4 (*)    All other components within normal limits  BASIC METABOLIC PANEL - Abnormal; Notable for the following:    Potassium 3.2 (*)    GFR calc non Af Amer 71 (*)    GFR calc Af Amer 82 (*)    All other components within normal limits  TROPONIN I   Dg Chest 2 View  11/19/2012  *RADIOLOGY REPORT*  Clinical Data: Chest pain  CHEST - 2 VIEW  Comparison: Chest x-ray of 01/27/2007  Findings: No active infiltrate or effusion is seen.  Mediastinal contours are stable.  Mild cardiomegaly is stable.  No acute skeletal abnormality is seen.  IMPRESSION: Stable chest x-ray.  No active lung disease.   Original Report Authenticated By: Dwyane Dee, M.D.      1. Chest pain      Date: 11/19/2012  Rate: 86  Rhythm: sinus tachycardia  QRS Axis: normal  Intervals: normal  ST/T Wave abnormalities: nonspecific ST changes  Conduction Disutrbances:none  Narrative Interpretation:   Old EKG Reviewed: unchanged    MDM          Benny Lennert, MD 11/19/12 1520

## 2012-11-19 NOTE — ED Notes (Signed)
Pt presents to er with c/o left side chest pain, pt states that started having chest pains two days ago, at first the chest pain was intermittent, today pain has been more constant and radiates down left arm, nausea, pt reports that pt feels "heavy"

## 2012-11-19 NOTE — ED Notes (Signed)
Pt reports mid-sternal cp off/on for last 2 days, today woke with cp at 6am and had been constant since pain has gotten worse today and radiates to left arm. Arm feels heavy. And has b=periods of nausea. Denies any increased stress. No weakness. Denies any smoking.

## 2012-11-19 NOTE — ED Notes (Signed)
Pt also reports that she has had a cough that is productive with white sputum and thinks that she may have had a fever this am,

## 2012-11-19 NOTE — ED Notes (Signed)
Upon returning from xray, pt reports that her chest pain increases with movement

## 2012-11-30 ENCOUNTER — Ambulatory Visit (HOSPITAL_COMMUNITY)
Admission: RE | Admit: 2012-11-30 | Discharge: 2012-11-30 | Disposition: A | Discharge status: Home / Self Care | Payer: BC Managed Care – PPO | Source: Ambulatory Visit | Attending: Internal Medicine | Admitting: Internal Medicine

## 2012-11-30 DIAGNOSIS — Z1231 Encounter for screening mammogram for malignant neoplasm of breast: Secondary | ICD-10-CM | POA: Insufficient documentation

## 2013-11-06 ENCOUNTER — Other Ambulatory Visit (HOSPITAL_COMMUNITY): Payer: Self-pay | Admitting: Internal Medicine

## 2013-11-06 DIAGNOSIS — Z139 Encounter for screening, unspecified: Secondary | ICD-10-CM

## 2013-12-03 ENCOUNTER — Ambulatory Visit (HOSPITAL_COMMUNITY)
Admission: RE | Admit: 2013-12-03 | Discharge: 2013-12-03 | Disposition: A | Discharge status: Home / Self Care | Payer: BC Managed Care – PPO | Source: Ambulatory Visit | Attending: Internal Medicine | Admitting: Internal Medicine

## 2013-12-03 DIAGNOSIS — Z1231 Encounter for screening mammogram for malignant neoplasm of breast: Secondary | ICD-10-CM | POA: Insufficient documentation

## 2013-12-03 DIAGNOSIS — Z139 Encounter for screening, unspecified: Secondary | ICD-10-CM

## 2014-04-22 ENCOUNTER — Emergency Department (HOSPITAL_COMMUNITY)
Admission: EM | Admit: 2014-04-22 | Discharge: 2014-04-22 | Disposition: A | Discharge status: Home / Self Care | Payer: BC Managed Care – PPO | Attending: Emergency Medicine | Admitting: Emergency Medicine

## 2014-04-22 ENCOUNTER — Encounter (HOSPITAL_COMMUNITY): Payer: Self-pay | Admitting: Emergency Medicine

## 2014-04-22 DIAGNOSIS — IMO0002 Reserved for concepts with insufficient information to code with codable children: Secondary | ICD-10-CM | POA: Insufficient documentation

## 2014-04-22 DIAGNOSIS — E7800 Pure hypercholesterolemia, unspecified: Secondary | ICD-10-CM | POA: Insufficient documentation

## 2014-04-22 DIAGNOSIS — I10 Essential (primary) hypertension: Secondary | ICD-10-CM | POA: Insufficient documentation

## 2014-04-22 DIAGNOSIS — S60511A Abrasion of right hand, initial encounter: Secondary | ICD-10-CM

## 2014-04-22 DIAGNOSIS — Z79899 Other long term (current) drug therapy: Secondary | ICD-10-CM | POA: Insufficient documentation

## 2014-04-22 DIAGNOSIS — K219 Gastro-esophageal reflux disease without esophagitis: Secondary | ICD-10-CM | POA: Insufficient documentation

## 2014-04-22 DIAGNOSIS — Y9289 Other specified places as the place of occurrence of the external cause: Secondary | ICD-10-CM | POA: Insufficient documentation

## 2014-04-22 DIAGNOSIS — Y9389 Activity, other specified: Secondary | ICD-10-CM | POA: Insufficient documentation

## 2014-04-22 DIAGNOSIS — M129 Arthropathy, unspecified: Secondary | ICD-10-CM | POA: Insufficient documentation

## 2014-04-22 DIAGNOSIS — Z8619 Personal history of other infectious and parasitic diseases: Secondary | ICD-10-CM | POA: Insufficient documentation

## 2014-04-22 DIAGNOSIS — Z880 Allergy status to penicillin: Secondary | ICD-10-CM | POA: Insufficient documentation

## 2014-04-22 DIAGNOSIS — W268XXA Contact with other sharp object(s), not elsewhere classified, initial encounter: Secondary | ICD-10-CM | POA: Insufficient documentation

## 2014-04-22 DIAGNOSIS — Z23 Encounter for immunization: Secondary | ICD-10-CM | POA: Insufficient documentation

## 2014-04-22 MED ORDER — TETANUS-DIPHTH-ACELL PERTUSSIS 5-2.5-18.5 LF-MCG/0.5 IM SUSP
0.5000 mL | Freq: Once | INTRAMUSCULAR | Status: AC
  Administered 2014-04-22: 0.5 mL via INTRAMUSCULAR
  Filled 2014-04-22: qty 0.5

## 2014-04-22 MED ORDER — CIPROFLOXACIN HCL 500 MG PO TABS: 500.0000 mg | ORAL_TABLET | Freq: Two times a day (BID) | ORAL | Status: DC

## 2014-04-22 NOTE — Discharge Instructions (Signed)
Please wash her hands with soap and water, pat dry, and apply a Band-Aid daily until the wound is healed. Please use Cipro 2 times daily with food. Position, or return to the emergency department if any signs of infection. Abrasions An abrasion is a cut or scrape of the skin. Abrasions do not go through all layers of the skin. HOME CARE  If a bandage (dressing) was put on your wound, change it as told by your doctor. If the bandage sticks, soak it off with warm.  Wash the area with water and soap 2 times a day. Rinse off the soap. Pat the area dry with a clean towel.  Put on medicated cream (ointment) as told by your doctor.  Change your bandage right away if it gets wet or dirty.  Only take medicine as told by your doctor.  See your doctor within 24-48 hours to get your wound checked.  Check your wound for redness, puffiness (swelling), or yellowish-white fluid (pus). GET HELP RIGHT AWAY IF:   You have more pain in the wound.  You have redness, swelling, or tenderness around the wound.  You have pus coming from the wound.  You have a fever or lasting symptoms for more than 2-3 days.  You have a fever and your symptoms suddenly get worse.  You have a bad smell coming from the wound or bandage. MAKE SURE YOU:   Understand these instructions.  Will watch your condition.  Will get help right away if you are not doing well or get worse. Document Released: 03/15/2008 Document Revised: 06/21/2012 Document Reviewed: 08/31/2011 [facility] Patient Information 2015 [place], [place]. This information is not intended to replace advice given to you by your health care provider. Make sure you discuss any questions you have with your health care provider.

## 2014-04-22 NOTE — ED Provider Notes (Signed)
CSN: [id number]     Arrival date & time 04/22/14  1016 History  This chart was scribed for non-physician practitioner, Lily Kocher, PA-C working with Richarda Blade, MD by Einar Pheasant, ED scribe. This patient was seen in room APFT22/APFT22 and the patient's care was started at 12:16 PM.    Chief Complaint  Patient presents with  . Abrasion   The history is provided by the patient. No language interpreter was used.   HPI Comments: Jamie Hunt is a 63 y.o. female who presents to the Emergency Department complaining of an abrasion to right middle finger that occurred 3 days ago. Pt states that she was using a scraper and injured her right middle finger. She reports associated pain when bending her affected finger. Pt is here for a wound check. Denies any history of DM, fever, chills, nausea, or emesis.   Pt does not recall the date of her last tetanus vaccine.   Past Medical History  Diagnosis Date  . Hypertension   . Arthritis   . High cholesterol   . Shingles   . Acid reflux    Past Surgical History  Procedure Laterality Date  . Abdominal hysterectomy    . Back surgery    . Cholecystectomy    . Vascular surgery     No family history on file. History  Substance Use Topics  . Smoking status: Never Smoker   . Smokeless tobacco: Not on file  . Alcohol Use: No   OB History   Grav Para Term Preterm Abortions TAB SAB Ect Mult Living                 Review of Systems  Constitutional: Negative for fatigue and unexpected weight change.  Respiratory: Negative for chest tightness and shortness of breath.   Cardiovascular: Negative for chest pain, palpitations and leg swelling.  Gastrointestinal: Negative for abdominal pain and blood in stool.  Skin: Positive for wound.  Neurological: Negative for dizziness, syncope, light-headedness and headaches.  All other systems reviewed and are negative.  Allergies  Codeine and Penicillins  Home Medications   Prior to  Admission medications   Medication Sig Start Date End Date Taking? Authorizing Provider  famciclovir (FAMVIR) 250 MG tablet Take 250 mg by mouth 2 (two) times daily.   Yes Historical Provider, MD  fexofenadine (ALLEGRA) 180 MG tablet Take 180 mg by mouth daily.   Yes Historical Provider, MD  hydrALAZINE (APRESOLINE) 25 MG tablet Take 25 mg by mouth 2 (two) times daily.   Yes Historical Provider, MD  hydrochlorothiazide (HYDRODIURIL) 25 MG tablet Take 25 mg by mouth daily.   Yes Historical Provider, MD  HYDROcodone-acetaminophen (NORCO) 7.5-325 MG per tablet Take 1 tablet by mouth 2 (two) times daily as needed for pain.    Yes Historical Provider, MD  lovastatin (MEVACOR) 20 MG tablet Take 20 mg by mouth at bedtime.   Yes Historical Provider, MD  nabumetone (RELAFEN) 750 MG tablet Take 750 mg by mouth 2 (two) times daily.   Yes Historical Provider, MD  omeprazole (PRILOSEC) 20 MG capsule Take 20 mg by mouth daily.   Yes Historical Provider, MD  quinapril (ACCUPRIL) 40 MG tablet Take 40 mg by mouth daily.    Yes Historical Provider, MD  verapamil (VERELAN PM) 240 MG 24 hr capsule Take 240 mg by mouth daily.   Yes Historical Provider, MD   BP 162/89  Pulse 78  Temp(Src) 98.2 F (36.8 C) (Oral)  Resp 16  SpO2 99%  Physical Exam  Nursing note and vitals reviewed. Constitutional: She is oriented to person, place, and time. She appears well-developed and well-nourished. No distress.  HENT:  Head: Normocephalic and atraumatic.  Eyes: Conjunctivae and EOM are normal.  Neck: Neck supple. No tracheal deviation present.  Cardiovascular: Normal rate.   Pulmonary/Chest: Effort normal. No respiratory distress.  Musculoskeletal: Normal range of motion.  Neurological: She is alert and oriented to person, place, and time.  Skin: Skin is warm and dry.  Right hand: the right 3 rd finger has an abrasion just behind the PIP joint. No bone or tendon involvement. No swelling of the webb spaces. No read  streaking. Cap refill is less than 2 seconds. Radial pulses are 2+. No palpable lymph nodes in the right axilla.   Psychiatric: She has a normal mood and affect. Her behavior is normal.    ED Course  Procedures (including critical care time)  DIAGNOSTIC STUDIES: Oxygen Saturation is 99% on RA, normal by my interpretation.    COORDINATION OF CARE: 12:20 PM- Will prescribe short prescription of Cipro. Pt was advised of proper wound care. Advised pt to return to the ED if any signs of infection begin to develop. Pt advised of plan for treatment and pt agrees.  Labs Review Labs Reviewed - No data to display  Imaging Review No results found.   EKG Interpretation None      MDM Patient noted to have an abrasion of the right middle finger. There is no red streaking appreciated. The vital signs are well within normal limits. The patient has no immunosuppression reported. The patient is advised to cleanse the wound daily with soap and water, and apply a Band-Aid. Prescription for Cipro given to the patient. The patient is to see her primary physician or return to the emergency apartment immediately if any signs of infection.    Final diagnoses:  None    *I have reviewed nursing notes, vital signs, and all appropriate lab and imaging results for this patient.*  I personally performed the services described in this documentation, which was scribed in my presence. The recorded information has been reviewed and is accurate.    Lenox Ahr, PA-C 04/22/14 1811

## 2014-04-22 NOTE — ED Notes (Signed)
C/o laceration to rt middle finger per pt. Bandage removed. Abrasion noted to rt middle finger. Bleeding controlled. Pt reports cutting self with tool. Tetanus unknown,. Happened Friday per pt

## 2014-04-23 NOTE — ED Provider Notes (Signed)
Medical screening examination/treatment/procedure(s) were performed by non-physician practitioner and as supervising physician I was immediately available for consultation/collaboration.  Richarda Blade, MD 04/23/14 [phone]

## 2014-05-10 ENCOUNTER — Encounter (HOSPITAL_COMMUNITY): Payer: Self-pay | Admitting: Emergency Medicine

## 2014-05-10 ENCOUNTER — Emergency Department (HOSPITAL_COMMUNITY): Payer: BC Managed Care – PPO

## 2014-05-10 ENCOUNTER — Emergency Department (HOSPITAL_COMMUNITY)
Admission: EM | Admit: 2014-05-10 | Discharge: 2014-05-10 | Disposition: A | Discharge status: Home / Self Care | Payer: BC Managed Care – PPO | Attending: Emergency Medicine | Admitting: Emergency Medicine

## 2014-05-10 DIAGNOSIS — Z8619 Personal history of other infectious and parasitic diseases: Secondary | ICD-10-CM | POA: Insufficient documentation

## 2014-05-10 DIAGNOSIS — K921 Melena: Secondary | ICD-10-CM | POA: Insufficient documentation

## 2014-05-10 DIAGNOSIS — Z79899 Other long term (current) drug therapy: Secondary | ICD-10-CM | POA: Insufficient documentation

## 2014-05-10 DIAGNOSIS — E7800 Pure hypercholesterolemia, unspecified: Secondary | ICD-10-CM | POA: Insufficient documentation

## 2014-05-10 DIAGNOSIS — I10 Essential (primary) hypertension: Secondary | ICD-10-CM | POA: Insufficient documentation

## 2014-05-10 DIAGNOSIS — Z7982 Long term (current) use of aspirin: Secondary | ICD-10-CM | POA: Insufficient documentation

## 2014-05-10 DIAGNOSIS — K625 Hemorrhage of anus and rectum: Secondary | ICD-10-CM | POA: Insufficient documentation

## 2014-05-10 DIAGNOSIS — Z880 Allergy status to penicillin: Secondary | ICD-10-CM | POA: Insufficient documentation

## 2014-05-10 DIAGNOSIS — K219 Gastro-esophageal reflux disease without esophagitis: Secondary | ICD-10-CM | POA: Insufficient documentation

## 2014-05-10 DIAGNOSIS — M129 Arthropathy, unspecified: Secondary | ICD-10-CM | POA: Insufficient documentation

## 2014-05-10 LAB — COMPREHENSIVE METABOLIC PANEL
ALT: 22 U/L (ref 0–35)
AST: 21 U/L (ref 0–37)
Albumin: 4 g/dL (ref 3.5–5.2)
Alkaline Phosphatase: 68 U/L (ref 39–117)
Anion gap: 13 (ref 5–15)
BUN: 14 mg/dL (ref 6–23)
CO2: 25 meq/L (ref 19–32)
CREATININE: 0.84 mg/dL (ref 0.50–1.10)
Calcium: 9.4 mg/dL (ref 8.4–10.5)
Chloride: 107 mEq/L (ref 96–112)
GFR, EST AFRICAN AMERICAN: 85 mL/min — AB (ref >90)
GFR, EST NON AFRICAN AMERICAN: 73 mL/min — AB (ref >90)
GLUCOSE: 88 mg/dL (ref 70–99)
Potassium: 3.6 mEq/L — ABNORMAL LOW (ref 3.7–5.3)
Sodium: 145 mEq/L (ref 137–147)
Total Bilirubin: 0.3 mg/dL (ref 0.3–1.2)
Total Protein: 7.5 g/dL (ref 6.0–8.3)

## 2014-05-10 LAB — CBC WITH DIFFERENTIAL/PLATELET
Basophils Absolute: 0 10*3/uL (ref 0.0–0.1)
Basophils Relative: 1 % (ref 0–1)
EOS PCT: 2 % (ref 0–5)
Eosinophils Absolute: 0.1 10*3/uL (ref 0.0–0.7)
HCT: 33.3 % — ABNORMAL LOW (ref 36.0–46.0)
Hemoglobin: 12.1 g/dL (ref 12.0–15.0)
LYMPHS ABS: 3.2 10*3/uL (ref 0.7–4.0)
LYMPHS PCT: 50 % — AB (ref 12–46)
MCH: 34.8 pg — ABNORMAL HIGH (ref 26.0–34.0)
MCHC: 36.3 g/dL — ABNORMAL HIGH (ref 30.0–36.0)
MCV: 95.7 fL (ref 78.0–100.0)
MONO ABS: 0.5 10*3/uL (ref 0.1–1.0)
Monocytes Relative: 7 % (ref 3–12)
Neutro Abs: 2.6 10*3/uL (ref 1.7–7.7)
Neutrophils Relative %: 40 % — ABNORMAL LOW (ref 43–77)
Platelets: 210 10*3/uL (ref 150–400)
RBC: 3.48 MIL/uL — AB (ref 3.87–5.11)
RDW: 12.6 % (ref 11.5–15.5)
WBC: 6.4 10*3/uL (ref 4.0–10.5)

## 2014-05-10 MED ORDER — IOHEXOL 300 MG/ML  SOLN
50.0000 mL | Freq: Once | INTRAMUSCULAR | Status: AC | PRN
  Administered 2014-05-10: 50 mL via ORAL

## 2014-05-10 MED ORDER — IOHEXOL 300 MG/ML  SOLN
100.0000 mL | Freq: Once | INTRAMUSCULAR | Status: AC | PRN
  Administered 2014-05-10: 100 mL via INTRAVENOUS

## 2014-05-10 NOTE — Discharge Instructions (Signed)
Follow up with Dr. Oneida Alar next week or follow up with Dr. Legrand Rams

## 2014-05-10 NOTE — ED Provider Notes (Addendum)
CSN: [id number]     Arrival date & time 05/10/14  1141 History  This chart was scribed for Maudry Diego, MD by Lowella Petties, ED Scribe. The patient was seen in room APA19/APA19. Patient's care was started at 1:19 PM.    Chief Complaint  Patient presents with  . Rectal Bleeding   Patient is a 63 y.o. female presenting with hematochezia. The history is provided by the patient. No language interpreter was used.  Rectal Bleeding Amount:  Moderate Duration:  2 days Timing:  Intermittent Progression:  Unchanged Chronicity:  Recurrent (happend many years ago) Context: defecation   Associated symptoms: no abdominal pain, no epistaxis, no fever, no hematemesis, no loss of consciousness and no vomiting    HPI Comments: Jamie Hunt is a 63 y.o. female who presents to the Emergency Department complaining of rectal bleeding during her BMs, onset 2 days ago. She reports passing a blood clot after straining to have a BM.   Past Medical History  Diagnosis Date  . Hypertension   . Arthritis   . High cholesterol   . Shingles   . Acid reflux    Past Surgical History  Procedure Laterality Date  . Abdominal hysterectomy    . Back surgery    . Cholecystectomy    . Vascular surgery     History reviewed. No pertinent family history. History  Substance Use Topics  . Smoking status: Never Smoker   . Smokeless tobacco: Not on file  . Alcohol Use: No   OB History   Grav Para Term Preterm Abortions TAB SAB Ect Mult Living                 Review of Systems  Constitutional: Negative for fever, appetite change and fatigue.  HENT: Negative for congestion, ear discharge, nosebleeds and sinus pressure.   Eyes: Negative for discharge.  Respiratory: Negative for cough.   Cardiovascular: Negative for chest pain.  Gastrointestinal: Positive for hematochezia and anal bleeding. Negative for vomiting, abdominal pain, diarrhea and hematemesis.  Genitourinary: Negative for frequency and hematuria.   Musculoskeletal: Negative for back pain.  Skin: Negative for rash.  Neurological: Negative for seizures, loss of consciousness and headaches.  Psychiatric/Behavioral: Negative for hallucinations.      Allergies  Codeine and Penicillins  Home Medications   Prior to Admission medications   Medication Sig Start Date End Date Taking? Authorizing Provider  Ascorbic Acid (VITAMIN C PO) Take 1 tablet by mouth daily.   Yes Historical Provider, MD  aspirin EC 81 MG tablet Take 81 mg by mouth daily.   Yes Historical Provider, MD  CALCIUM PO Take 1 tablet by mouth daily.   Yes Historical Provider, MD  Cyanocobalamin (VITAMIN B-12 PO) Take 1 tablet by mouth daily.   Yes Historical Provider, MD  famciclovir (FAMVIR) 250 MG tablet Take 250 mg by mouth 2 (two) times daily.   Yes Historical Provider, MD  ferrous sulfate (IRON SUPPLEMENT) 325 (65 FE) MG tablet Take 325 mg by mouth daily with breakfast.   Yes Historical Provider, MD  fexofenadine (ALLEGRA) 180 MG tablet Take 180 mg by mouth daily.   Yes Historical Provider, MD  hydrALAZINE (APRESOLINE) 25 MG tablet Take 25 mg by mouth 2 (two) times daily.   Yes Historical Provider, MD  hydrochlorothiazide (HYDRODIURIL) 25 MG tablet Take 25 mg by mouth daily.   Yes Historical Provider, MD  HYDROcodone-acetaminophen (NORCO) 7.5-325 MG per tablet Take 1 tablet by mouth 2 (two) times daily  as needed for pain.    Yes Historical Provider, MD  lovastatin (MEVACOR) 20 MG tablet Take 20 mg by mouth at bedtime.   Yes Historical Provider, MD  Multiple Vitamins-Minerals (ONE-A-DAY 50 PLUS) TABS Take 1 tablet by mouth daily.   Yes Historical Provider, MD  nabumetone (RELAFEN) 750 MG tablet Take 750 mg by mouth 2 (two) times daily.   Yes Historical Provider, MD  Omega-3 Fatty Acids (FISH OIL) 1200 MG CAPS Take 2 capsules by mouth daily.   Yes Historical Provider, MD  omeprazole (PRILOSEC) 20 MG capsule Take 20 mg by mouth daily.   Yes Historical Provider, MD   quinapril (ACCUPRIL) 40 MG tablet Take 40 mg by mouth daily.    Yes Historical Provider, MD  verapamil (VERELAN PM) 240 MG 24 hr capsule Take 240 mg by mouth daily.   Yes Historical Provider, MD   Triage Vitals: BP 163/91  Pulse 76  Temp(Src) 98.2 F (36.8 C) (Oral)  Resp 18  Ht 5\' 10"  (1.778 m)  Wt 211 lb (95.709 kg)  BMI 30.28 kg/m2  SpO2 100% Physical Exam  Constitutional: She is oriented to person, place, and time. She appears well-developed.  HENT:  Head: Normocephalic.  Eyes: Conjunctivae and EOM are normal. No scleral icterus.  Neck: Neck supple. No thyromegaly present.  Cardiovascular: Normal rate and regular rhythm.  Exam reveals no gallop and no friction rub.   No murmur heard. Pulmonary/Chest: No stridor. She has no wheezes. She has no rales. She exhibits no tenderness.  Abdominal: She exhibits no distension. There is no tenderness. There is no rebound.  Musculoskeletal: Normal range of motion. She exhibits no edema.  Lymphadenopathy:    She has no cervical adenopathy.  Neurological: She is oriented to person, place, and time. She exhibits normal muscle tone. Coordination normal.  Skin: No rash noted. No erythema.  Psychiatric: She has a normal mood and affect. Her behavior is normal.    ED Course  Procedures (including critical care time) DIAGNOSTIC STUDIES: Oxygen Saturation is 100% on room air, normal by my interpretation.    COORDINATION OF CARE: 1:24 PM-Discussed treatment plan with pt at bedside and pt agreed to plan.   Labs Review Labs Reviewed - No data to display  Imaging Review No results found.   EKG Interpretation None      MDM   Final diagnoses:  None   Rectal bleeding.  Follow up with gi   The chart was scribed for me under my direct supervision.  I personally performed the history, physical, and medical decision making and all procedures in the evaluation of this patient.Maudry Diego, MD 05/10/14 [place],  MD 05/10/14 [phone]

## 2014-05-10 NOTE — ED Notes (Signed)
Pt reports bright red blood in stool x2 since Wednesday. Pt denies abdominal pain, denies lightheadedness.

## 2014-05-10 NOTE — ED Notes (Signed)
Rectal bleeding since Wednesday night.  Has had 2 bowel movements.  States it looks like a normal bowel movement with bright red blood . Denies any other symptoms.

## 2014-05-13 ENCOUNTER — Telehealth: Payer: Self-pay

## 2014-05-13 NOTE — Telephone Encounter (Signed)
Yes, unless evidence of further rectal bleeding.

## 2014-05-13 NOTE — Telephone Encounter (Signed)
The first urgent appt is Aug.17. Would that be ok

## 2014-05-13 NOTE — Telephone Encounter (Signed)
Probably needs to be seen sooner than a month away. I would see if there is an urgent appt for next week and use that.

## 2014-05-13 NOTE — Telephone Encounter (Signed)
Pt called to make an appointment. She was seen in the ER for rectal bleeding but the bleeding has stopped now. They did not give her anything for the bleeding. I made her an appointment for September 3 but told to call back if the bleeding started back.

## 2014-05-14 NOTE — Telephone Encounter (Signed)
She is coming on Aug 18 @ 1:15 to see LSL

## 2014-05-28 ENCOUNTER — Ambulatory Visit (INDEPENDENT_AMBULATORY_CARE_PROVIDER_SITE_OTHER): Payer: BC Managed Care – PPO | Admitting: Gastroenterology

## 2014-05-28 ENCOUNTER — Encounter: Payer: Self-pay | Admitting: Gastroenterology

## 2014-05-28 ENCOUNTER — Encounter (HOSPITAL_COMMUNITY): Payer: Self-pay | Admitting: Pharmacy Technician

## 2014-05-28 ENCOUNTER — Encounter (INDEPENDENT_AMBULATORY_CARE_PROVIDER_SITE_OTHER): Payer: Self-pay

## 2014-05-28 VITALS — BP 153/87 | HR 72 | Temp 97.6°F | Ht 70.0 in | Wt 213.6 lb

## 2014-05-28 DIAGNOSIS — R1013 Epigastric pain: Secondary | ICD-10-CM

## 2014-05-28 DIAGNOSIS — R1011 Right upper quadrant pain: Secondary | ICD-10-CM | POA: Insufficient documentation

## 2014-05-28 DIAGNOSIS — K219 Gastro-esophageal reflux disease without esophagitis: Secondary | ICD-10-CM

## 2014-05-28 DIAGNOSIS — R1012 Left upper quadrant pain: Secondary | ICD-10-CM

## 2014-05-28 DIAGNOSIS — K625 Hemorrhage of anus and rectum: Secondary | ICD-10-CM

## 2014-05-28 MED ORDER — PEG 3350-KCL-NA BICARB-NACL 420 G PO SOLR: 4000.0000 mL | ORAL | Status: DC

## 2014-05-28 NOTE — Progress Notes (Signed)
Primary Care Physician:  Rosita Fire, MD  Primary Gastroenterologist:  Barney Drain, MD   Chief Complaint  Patient presents with  . Rectal Bleeding    HPI:  Jamie Hunt is a 63 y.o. female here for further evaluation of rectal bleeding. She was seen in the ER for this on 05/10/2014. Describes 5 days of large volume rectal bleeding associated with blood clots. Hemoglobin 12.1 in the ER. CT was unremarkable.No diarrhea or constipation.  Upper abdominal pain related to greasy foods and raw veggies. BM usually daily to every other day. Takes iron about every other day for years. Relafen BID for years. Vicodin about once daily. Heartburn even on prilosec. Has had heartburn for years. Some nocturnal symptoms. No melena. No dysphagia.  Current Outpatient Prescriptions  Medication Sig Dispense Refill  . Ascorbic Acid (VITAMIN C PO) Take 1 tablet by mouth daily.      Marland Kitchen aspirin EC 81 MG tablet Take 81 mg by mouth daily.      Marland Kitchen CALCIUM PO Take 1 tablet by mouth daily.      . Cyanocobalamin (VITAMIN B-12 PO) Take 1 tablet by mouth daily.      . famciclovir (FAMVIR) 250 MG tablet Take 250 mg by mouth 2 (two) times daily.      . ferrous sulfate (IRON SUPPLEMENT) 325 (65 FE) MG tablet Take 325 mg by mouth daily with breakfast.      . fexofenadine (ALLEGRA) 180 MG tablet Take 180 mg by mouth daily.      . hydrALAZINE (APRESOLINE) 25 MG tablet Take 25 mg by mouth 2 (two) times daily.      . hydrochlorothiazide (HYDRODIURIL) 25 MG tablet Take 25 mg by mouth daily.      Marland Kitchen HYDROcodone-acetaminophen (NORCO) 7.5-325 MG per tablet Take 1 tablet by mouth 2 (two) times daily as needed for pain.       Marland Kitchen lovastatin (MEVACOR) 20 MG tablet Take 20 mg by mouth at bedtime.      . Multiple Vitamins-Minerals (ONE-A-DAY 50 PLUS) TABS Take 1 tablet by mouth daily.      . nabumetone (RELAFEN) 750 MG tablet Take 750 mg by mouth 2 (two) times daily.      . Omega-3 Fatty Acids (FISH OIL) 1200 MG CAPS Take 2 capsules by  mouth daily.      Marland Kitchen omeprazole (PRILOSEC) 20 MG capsule Take 20 mg by mouth daily.      . quinapril (ACCUPRIL) 40 MG tablet Take 40 mg by mouth daily.       . verapamil (VERELAN PM) 240 MG 24 hr capsule Take 240 mg by mouth daily.       No current facility-administered medications for this visit.    Allergies as of 05/28/2014 - Review Complete 05/28/2014  Allergen Reaction Noted  . Codeine Nausea And Vomiting 04/22/2014  . Penicillins  11/30/2011    Past Medical History  Diagnosis Date  . Hypertension   . Arthritis   . High cholesterol   . Shingles   . Acid reflux     Past Surgical History  Procedure Laterality Date  . Abdominal hysterectomy    . Back surgery    . Cholecystectomy    . Vascular surgery      varicose veins  . Colonoscopy  05/08/2009    SLF:A few scattered diverticula throughout the entire colon.  Small  internal hemorrhoids.  Otherwise, no polyps, masses, inflammatory changes, or AVMs seen.  Normal retroflexed view of the rectum  Family History  Problem Relation Age of Onset  . Colon cancer Neg Hx   . Liver disease Neg Hx     History   Social History  . Marital Status: Widowed    Spouse Name: N/A    Number of Children: N/A  . Years of Education: N/A   Occupational History  . Not on file.   Social History Main Topics  . Smoking status: Never Smoker   . Smokeless tobacco: Not on file  . Alcohol Use: No  . Drug Use: No  . Sexual Activity: Not on file   Other Topics Concern  . Not on file   Social History Narrative  . No narrative on file      ROS:  General: Negative for anorexia, weight loss, fever, chills, fatigue, weakness. Eyes: Negative for vision changes.  ENT: Negative for hoarseness, difficulty swallowing , nasal congestion. CV: Negative for chest pain, angina, palpitations, dyspnea on exertion, peripheral edema.  Respiratory: Negative for dyspnea at rest, dyspnea on exertion, cough, sputum, wheezing.  GI: See history of  present illness. GU:  Negative for dysuria, hematuria, urinary incontinence, urinary frequency, nocturnal urination.  MS: Negative for joint pain, low back pain.  Derm: Negative for rash or itching.  Neuro: Negative for weakness, abnormal sensation, seizure, frequent headaches, memory loss, confusion.  Psych: Negative for anxiety, depression, suicidal ideation, hallucinations.  Endo: Negative for unusual weight change.  Heme: Negative for bruising or bleeding. Allergy: Negative for rash or hives.    Physical Examination:  BP 153/87  Pulse 72  Temp(Src) 97.6 F (36.4 C) (Oral)  Ht 5\' 10"  (1.778 m)  Wt 213 lb 9.6 oz (96.888 kg)  BMI 30.65 kg/m2   General: Well-nourished, well-developed in no acute distress.  Head: Normocephalic, atraumatic.   Eyes: Conjunctiva pink, no icterus. Mouth: Oropharyngeal mucosa moist and pink , no lesions erythema or exudate. Neck: Supple without thyromegaly, masses, or lymphadenopathy.  Lungs: Clear to auscultation bilaterally.  Heart: Regular rate and rhythm, no murmurs rubs or gallops.  Abdomen: Bowel sounds are normal, mild epigastric tenderness, nondistended, no hepatosplenomegaly or masses, no abdominal bruits or    hernia , no rebound or guarding.   Rectal: Deferred Extremities: No lower extremity edema. No clubbing or deformities.  Neuro: Alert and oriented x 4 , grossly normal neurologically.  Skin: Warm and dry, no rash or jaundice.   Psych: Alert and cooperative, normal mood and affect.  Labs: Lab Results  Component Value Date   WBC 6.4 05/10/2014   HGB 12.1 05/10/2014   HCT 33.3* 05/10/2014   MCV 95.7 05/10/2014   PLT 210 05/10/2014   Lab Results  Component Value Date   CREATININE 0.84 05/10/2014   BUN 14 05/10/2014   NA 145 05/10/2014   K 3.6* 05/10/2014   CL 107 05/10/2014   CO2 25 05/10/2014   Lab Results  Component Value Date   ALT 22 05/10/2014   AST 21 05/10/2014   ALKPHOS 68 05/10/2014   BILITOT 0.3 05/10/2014     Imaging  Studies: Ct Abdomen Pelvis W Contrast  05/10/2014   CLINICAL DATA:  Rectal bleeding.  EXAM: CT ABDOMEN AND PELVIS WITH CONTRAST  TECHNIQUE: Multidetector CT imaging of the abdomen and pelvis was performed using the standard protocol following bolus administration of intravenous contrast.  CONTRAST:  85mL OMNIPAQUE IOHEXOL 300 MG/ML SOLN, 141mL OMNIPAQUE IOHEXOL 300 MG/ML SOLN  COMPARISON:  None.  FINDINGS: The lung bases are clear.  The liver demonstrates no focal  abnormality. There is no intrahepatic or extrahepatic biliary ductal dilatation. The gallbladder is surgically absent. The spleen demonstrates no focal abnormality. The kidneys, adrenal glands and pancreas are normal. The bladder is unremarkable.  The stomach, duodenum, small intestine, and large intestine demonstrate no contrast extravasation or dilatation. There is a normal caliber appendix in the right lower quadrant without periappendiceal inflammatory changes. There is no pneumoperitoneum, pneumatosis, or portal venous gas. There is no abdominal or pelvic free fluid. There is no lymphadenopathy.  The abdominal aorta is normal in caliber with atherosclerosis.  There are no lytic or sclerotic osseous lesions. There is degenerative disc disease at L5-S1 with bilateral facet arthropathy and foraminal stenosis.  IMPRESSION: 1. No acute abdominal or pelvic pathology.   Electronically Signed   By: Kathreen Devoid   On: 05/10/2014 15:37

## 2014-05-28 NOTE — Patient Instructions (Signed)
Colonoscopy with possible upper endoscopy. See separate instructions.

## 2014-05-29 ENCOUNTER — Encounter: Payer: Self-pay | Admitting: Gastroenterology

## 2014-05-29 NOTE — Assessment & Plan Note (Signed)
Recent 5 day history of moderate to large volume hematochezia. Differential diagnosis includes diverticular bleed, benign anorectal source, less likely malignancy or IBD. Last colonoscopy 2010. Recommend updating colonoscopy for further evaluation.  I have discussed the risks, alternatives, benefits with regards to but not limited to the risk of reaction to medication, bleeding, infection, perforation and the patient is agreeable to proceed. Written consent to be obtained.

## 2014-05-29 NOTE — Assessment & Plan Note (Signed)
Patient complains of poorly controlled acid reflux. She's had for years. Some postprandial epigastric discomfort. Chronically on NSAIDs. Doubt recent bleed due to rapid transit upper GI bleed. Recommend upper endoscopy for further evaluation.  I have discussed the risks, alternatives, benefits with regards to but not limited to the risk of reaction to medication, bleeding, infection, perforation and the patient is agreeable to proceed. Written consent to be obtained.

## 2014-06-03 ENCOUNTER — Encounter (HOSPITAL_COMMUNITY): Payer: Self-pay | Admitting: *Deleted

## 2014-06-03 ENCOUNTER — Ambulatory Visit (HOSPITAL_COMMUNITY)
Admission: RE | Admit: 2014-06-03 | Discharge: 2014-06-03 | Disposition: A | Discharge status: Home / Self Care | Payer: BC Managed Care – PPO | Source: Ambulatory Visit | Attending: Gastroenterology | Admitting: Gastroenterology

## 2014-06-03 ENCOUNTER — Encounter (HOSPITAL_COMMUNITY)
Admission: RE | Disposition: A | Discharge status: Home / Self Care | Payer: Self-pay | Source: Ambulatory Visit | Attending: Gastroenterology

## 2014-06-03 DIAGNOSIS — Z79899 Other long term (current) drug therapy: Secondary | ICD-10-CM | POA: Diagnosis not present

## 2014-06-03 DIAGNOSIS — Z7982 Long term (current) use of aspirin: Secondary | ICD-10-CM | POA: Insufficient documentation

## 2014-06-03 DIAGNOSIS — K3189 Other diseases of stomach and duodenum: Secondary | ICD-10-CM | POA: Diagnosis not present

## 2014-06-03 DIAGNOSIS — K449 Diaphragmatic hernia without obstruction or gangrene: Secondary | ICD-10-CM | POA: Insufficient documentation

## 2014-06-03 DIAGNOSIS — Q438 Other specified congenital malformations of intestine: Secondary | ICD-10-CM | POA: Insufficient documentation

## 2014-06-03 DIAGNOSIS — E7800 Pure hypercholesterolemia, unspecified: Secondary | ICD-10-CM | POA: Diagnosis not present

## 2014-06-03 DIAGNOSIS — D126 Benign neoplasm of colon, unspecified: Secondary | ICD-10-CM | POA: Diagnosis not present

## 2014-06-03 DIAGNOSIS — K625 Hemorrhage of anus and rectum: Secondary | ICD-10-CM | POA: Diagnosis not present

## 2014-06-03 DIAGNOSIS — K648 Other hemorrhoids: Secondary | ICD-10-CM | POA: Insufficient documentation

## 2014-06-03 DIAGNOSIS — R1013 Epigastric pain: Secondary | ICD-10-CM

## 2014-06-03 DIAGNOSIS — I10 Essential (primary) hypertension: Secondary | ICD-10-CM | POA: Diagnosis not present

## 2014-06-03 DIAGNOSIS — K5730 Diverticulosis of large intestine without perforation or abscess without bleeding: Secondary | ICD-10-CM | POA: Insufficient documentation

## 2014-06-03 DIAGNOSIS — K219 Gastro-esophageal reflux disease without esophagitis: Secondary | ICD-10-CM

## 2014-06-03 HISTORY — PX: ESOPHAGOGASTRODUODENOSCOPY: SHX5428

## 2014-06-03 HISTORY — DX: Other specified postprocedural states: Z98.890

## 2014-06-03 HISTORY — DX: Nausea with vomiting, unspecified: R11.2

## 2014-06-03 HISTORY — PX: COLONOSCOPY: SHX5424

## 2014-06-03 SURGERY — COLONOSCOPY
Anesthesia: Moderate Sedation

## 2014-06-03 MED ORDER — STERILE WATER FOR IRRIGATION IR SOLN
Status: DC | PRN
  Administered 2014-06-03: 13:00:00

## 2014-06-03 MED ORDER — LIDOCAINE VISCOUS 2 % MT SOLN
OROMUCOSAL | Status: DC | PRN
  Administered 2014-06-03: 3 mL via OROMUCOSAL

## 2014-06-03 MED ORDER — PROMETHAZINE HCL 25 MG/ML IJ SOLN
INTRAMUSCULAR | Status: DC | PRN
  Administered 2014-06-03: 12.5 mg via INTRAVENOUS

## 2014-06-03 MED ORDER — SODIUM CHLORIDE 0.9 % IJ SOLN
INTRAMUSCULAR | Status: AC
  Filled 2014-06-03: qty 10

## 2014-06-03 MED ORDER — SODIUM CHLORIDE 0.9 % IV SOLN
INTRAVENOUS | Status: DC
  Administered 2014-06-03: 13:00:00 via INTRAVENOUS

## 2014-06-03 MED ORDER — MIDAZOLAM HCL 5 MG/5ML IJ SOLN
INTRAMUSCULAR | Status: DC | PRN
  Administered 2014-06-03 (×2): 1 mg via INTRAVENOUS
  Administered 2014-06-03 (×2): 2 mg via INTRAVENOUS

## 2014-06-03 MED ORDER — MIDAZOLAM HCL 5 MG/5ML IJ SOLN
INTRAMUSCULAR | Status: AC
  Filled 2014-06-03: qty 10

## 2014-06-03 MED ORDER — MEPERIDINE HCL 100 MG/ML IJ SOLN
INTRAMUSCULAR | Status: DC | PRN
  Administered 2014-06-03 (×4): 25 mg via INTRAVENOUS

## 2014-06-03 MED ORDER — MEPERIDINE HCL 100 MG/ML IJ SOLN
INTRAMUSCULAR | Status: AC
  Filled 2014-06-03: qty 2

## 2014-06-03 MED ORDER — PROMETHAZINE HCL 25 MG/ML IJ SOLN
INTRAMUSCULAR | Status: AC
  Filled 2014-06-03: qty 1

## 2014-06-03 MED ORDER — LIDOCAINE VISCOUS 2 % MT SOLN
OROMUCOSAL | Status: AC
  Filled 2014-06-03: qty 15

## 2014-06-03 NOTE — Op Note (Signed)
[facility] [address] [place], [address]   COLONOSCOPY PROCEDURE REPORT  PATIENT: Jamie, Hunt  MR#: [id number] BIRTHDATE: 08/25/1951 , 63  yrs. old GENDER: Female ENDOSCOPIST: Barney Drain, MD REFERRED AX:ENMMHWKG [name], M.D. PROCEDURE DATE:  06/03/2014 PROCEDURE:   Colonoscopy with cold biopsy polypectomy INDICATIONS:Rectal Bleeding. MEDICATIONS: Demerol 75 mg IV, Versed 5 mg IV, and Promethazine (Phenergan) 12.5mg  IV  DESCRIPTION OF PROCEDURE:    Physical exam was performed.  Informed consent was obtained from the patient after explaining the benefits, risks, and alternatives to procedure.  The patient was connected to monitor and placed in left lateral position. Continuous oxygen was provided by nasal cannula and IV medicine administered through an indwelling cannula.  After administration of sedation and rectal exam, the patients rectum was intubated and the EC-[id number] ([id number])  colonoscope was advanced under direct visualization to the cecum.  The scope was removed slowly by carefully examining the color, texture, anatomy, and integrity mucosa on the way out.  The patient was recovered in endoscopy and discharged home in satisfactory condition.    COLON FINDINGS: Six sessile polyps measuring 2-3 mm in size were found at the cecum and in the sigmoid colon.  A polypectomy was performed with cold forceps.  , Moderate sized internal hemorrhoids were found.  , The LEFT colon IS redundant.  Manual abdominal counter-pressure was used to reach the cecum.  The patient was moved on to their back to reach the cecum, and There was moderate diverticulosis noted throughout the entire examined colon with associated tortuosity and muscular hypertrophy.  PREP QUALITY: good.  CECAL W/D TIME: 22 minutes     COMPLICATIONS: None  ENDOSCOPIC IMPRESSION: 1.   Six COLON polyps REMOVED 2.   Moderate sized internal hemorrhoids 3.   The LEFT COLON IS redundant 4.    Moderate diverticulosis throughout the entire examined colon  RECOMMENDATIONS: CONTINUE YOUR WEIGHT LOSS EFFORTS.  LOSE 10 LBS. DRINK WATER TO KEEP URINE LIGHT YELLOW. CONTINUE OMEPRAZOLE.  TAKE 30 MINUTES PRIOR TO FIRST MEAL.  INCREASE TO TWICE DAILY IF YOUR SYMPTOMS ARE NOT IDEALLY CONTROLLED. FOLLOW A Low fat/HIGH FIBER DIET.  AVOID ITEMS THAT CAUSE BLOATING.  BIOPSY RESULTS WILL BE BACK IN 7 DAYS.  FOLLOW UP IN DEC 2015. NEXT TCS IN 5-10 YEARS WITH AN OVERTUBE.     _______________________________ Lorrin MaisBarney Drain, MD 06/03/2014 2:17 PM

## 2014-06-03 NOTE — Progress Notes (Signed)
REVIEWED.  

## 2014-06-03 NOTE — Progress Notes (Signed)
cc'd to pcp 

## 2014-06-03 NOTE — Op Note (Signed)
[facility] [address] [place], [address]   ENDOSCOPY PROCEDURE REPORT  PATIENT: Jamie Hunt, Jamie Hunt  MR#: [id number] BIRTHDATE: Nov 12, 1950 , 76  yrs. old GENDER: Female  ENDOSCOPIST: Barney Drain, MD REFERRED HW:[id number] [name], M.D.  PROCEDURE DATE: 06/03/2014 PROCEDURE:   EGD, diagnostic  INDICATIONS:Dyspepsia. MEDICATIONS: TCS+Versed 1mg  IV and Demerol 25 mg IV TOPICAL ANESTHETIC:   Viscous Xylocaine  DESCRIPTION OF PROCEDURE:     Physical exam was performed.  Informed consent was obtained from the patient after explaining the benefits, risks, and alternatives to the procedure.  The patient was connected to the monitor and placed in the left lateral position.  Continuous oxygen was provided by nasal cannula and IV medicine administered through an indwelling cannula.  After administration of sedation, the patients esophagus was intubated and the EG-[id number] ([id number])  endoscope was advanced under direct visualization to the second portion of the duodenum.  The scope was removed slowly by carefully examining the color, texture, anatomy, and integrity of the mucosa on the way out.  The patient was recovered in endoscopy and discharged home in satisfactory condition.   ESOPHAGUS: The mucosa of the esophagus appeared normal.   A small hiatal hernia was noted.   DUODENUM: The duodenal mucosa showed no abnormalities in the bulb and second portion of the duodenum. STOMACH: The mucosa of the stomach appeared normal.  COMPLICATIONS:   None  ENDOSCOPIC IMPRESSION: 1.   Small hiatal hernia  RECOMMENDATIONS: CONTINUE YOUR WEIGHT LOSS EFFORTS.  LOSE 10 LBS. DRINK WATER TO KEEP URINE LIGHT YELLOW. CONTINUE OMEPRAZOLE.  TAKE 30 MINUTES PRIOR TO FIRST MEAL.  INCREASE TO TWICE DAILY IF YOUR SYMPTOMS ARE NOT IDEALLY CONTROLLED. FOLLOW A Low fat/HIGH FIBER DIET.  AVOID ITEMS THAT CAUSE BLOATING.  BIOPSY RESULTS WILL BE BACK IN 7 DAYS.  FOLLOW UP IN DEC 2015. NEXT  TCS IN 5-10 YEARS WITH AN OVERTUBE.   REPEAT EXAM:  _______________________________ Lorrin MaisBarney Drain, MD 06/03/2014 2:22 PM

## 2014-06-03 NOTE — Discharge Instructions (Signed)
You had 6 polyps removed. You have internal hemorrhoids, WHICH CAUSES RECTAL BLEEDING. You have a SMALL HIATAL HERNIA.    CONTINUE YOUR WEIGHT LOSS EFFORTS. LOSE 10 LBS.  DRINK WATER TO KEEP YOUR URINE LIGHT YELLOW.  CONTINUE OMEPRAZOLE.  TAKE 30 MINUTES PRIOR TO YOUR FIRST MEAL. YOU CAN INCREASE TO TWICE DAILY IF YOUR SYMPTOMS ARE NOT IDEALLY CONTROLLED.  FOLLOW A Low fat/HIGH FIBER DIET. AVOID ITEMS THAT CAUSE BLOATING. SEE INFO BELOW.  YOUR BIOPSY RESULTS WILL BE BACK IN 7 DAYS.  FOLLOW UP IN DEC 2015.  NEXT TCS IN 5-10 YEARS.   ENDOSCOPY Care After Read the instructions outlined below and refer to this sheet in the next week. These discharge instructions provide you with general information on caring for yourself after you leave the hospital. While your treatment has been planned according to the most current medical practices available, unavoidable complications occasionally occur. If you have any problems or questions after discharge, call DR. Shalona Harbour, [phone].  ACTIVITY  You may resume your regular activity, but move at a slower pace for the next 24 hours.   Take frequent rest periods for the next 24 hours.   Walking will help get rid of the air and reduce the bloated feeling in your belly (abdomen).   No driving for 24 hours (because of the medicine (anesthesia) used during the test).   You may shower.   Do not sign any important legal documents or operate any machinery for 24 hours (because of the anesthesia used during the test).    NUTRITION  Drink plenty of fluids.   You may resume your normal diet as instructed by your doctor.   Begin with a light meal and progress to your normal diet. Heavy or fried foods are harder to digest and may make you feel sick to your stomach (nauseated).   Avoid alcoholic beverages for 24 hours or as instructed.    MEDICATIONS  You may resume your normal medications.   WHAT YOU CAN EXPECT TODAY  Some feelings of  bloating in the abdomen.   Passage of more gas than usual.   Spotting of blood in your stool or on the toilet paper  .  IF YOU HAD POLYPS REMOVED DURING THE ENDOSCOPY:  Eat a soft diet IF YOU HAVE NAUSEA, BLOATING, ABDOMINAL PAIN, OR VOMITING.    FINDING OUT THE RESULTS OF YOUR TEST Not all test results are available during your visit. DR. Oneida Alar WILL CALL YOU WITHIN 7 DAYS OF YOUR PROCEDUE WITH YOUR RESULTS. Do not assume everything is normal if you have not heard from DR. Rithvik Orcutt IN ONE WEEK, CALL HER OFFICE AT [phone].  SEEK IMMEDIATE MEDICAL ATTENTION AND CALL THE OFFICE: [phone] IF:  You have more than a spotting of blood in your stool.   Your belly is swollen (abdominal distention).   You are nauseated or vomiting.   You have a temperature over 101F.   You have abdominal pain or discomfort that is severe or gets worse throughout the day.  Low-Fat Diet BREADS, CEREALS, PASTA, RICE, DRIED PEAS, AND BEANS These products are high in carbohydrates and most are low in fat. Therefore, they can be increased in the diet as substitutes for fatty foods. They too, however, contain calories and should not be eaten in excess. Cereals can be eaten for snacks as well as for breakfast.   FRUITS AND VEGETABLES It is good to eat fruits and vegetables. Besides being sources of fiber, both are rich in vitamins  and some minerals. They help you get the daily allowances of these nutrients. Fruits and vegetables can be used for snacks and desserts.  MEATS Limit lean meat, chicken, [place], and fish to no more than 6 ounces per day. Beef, Pork, and Lamb Use lean cuts of beef, pork, and lamb. Lean cuts include:  Extra-lean ground beef.  Arm roast.  Sirloin tip.  Center-cut ham.  Round steak.  Loin chops.  Rump roast.  Tenderloin.  Trim all fat off the outside of meats before cooking. It is not necessary to severely decrease the intake of red meat, but lean choices should be made.  Lean meat is rich in protein and contains a highly absorbable form of iron. Premenopausal women, in particular, should avoid reducing lean red meat because this could increase the risk for low red blood cells (iron-deficiency anemia).  Chicken and [place] These are good sources of protein. The fat of poultry can be reduced by removing the skin and underlying fat layers before cooking. Chicken and [place] can be substituted for lean red meat in the diet. Poultry should not be fried or covered with high-fat sauces. Fish and Shellfish Fish is a good source of protein. Shellfish contain cholesterol, but they usually are low in saturated fatty acids. The preparation of fish is important. Like chicken and [place], they should not be fried or covered with high-fat sauces. EGGS Egg whites contain no fat or cholesterol. They can be eaten often. Try 1 to 2 egg whites instead of whole eggs in recipes or use egg substitutes that do not contain yolk. MILK AND DAIRY PRODUCTS Use skim or 1% milk instead of 2% or whole milk. Decrease whole milk, natural, and processed cheeses. Use nonfat or low-fat (2%) cottage cheese or low-fat cheeses made from vegetable oils. Choose nonfat or low-fat (1 to 2%) yogurt. Experiment with evaporated skim milk in recipes that call for heavy cream. Substitute low-fat yogurt or low-fat cottage cheese for sour cream in dips and salad dressings. Have at least 2 servings of low-fat dairy products, such as 2 glasses of skim (or 1%) milk each day to help get your daily calcium intake. FATS AND OILS Reduce the total intake of fats, especially saturated fat. Butterfat, lard, and beef fats are high in saturated fat and cholesterol. These should be avoided as much as possible. Vegetable fats do not contain cholesterol, but certain vegetable fats, such as coconut oil, palm oil, and palm kernel oil are very high in saturated fats. These should be limited. These fats are often used in bakery goods,  processed foods, popcorn, oils, and nondairy creamers. Vegetable shortenings and some peanut butters contain hydrogenated oils, which are also saturated fats. Read the labels on these foods and check for saturated vegetable oils. Unsaturated vegetable oils and fats do not raise blood cholesterol. However, they should be limited because they are fats and are high in calories. Total fat should still be limited to 30% of your daily caloric intake. Desirable liquid vegetable oils are corn oil, cottonseed oil, olive oil, canola oil, safflower oil, soybean oil, and sunflower oil. Peanut oil is not as good, but small amounts are acceptable. Buy a heart-healthy tub margarine that has no partially hydrogenated oils in the ingredients. Mayonnaise and salad dressings often are made from unsaturated fats, but they should also be limited because of their high calorie and fat content. Seeds, nuts, peanut butter, olives, and avocados are high in fat, but the fat is mainly the unsaturated type. These  foods should be limited mainly to avoid excess calories and fat. OTHER EATING TIPS Snacks  Most sweets should be limited as snacks. They tend to be rich in calories and fats, and their caloric content outweighs their nutritional value. Some good choices in snacks are graham crackers, melba toast, soda crackers, bagels (no egg), English muffins, fruits, and vegetables. These snacks are preferable to snack crackers, [place] fries, TORTILLA CHIPS, and POTATO chips. Popcorn should be air-popped or cooked in small amounts of liquid vegetable oil. Desserts Eat fruit, low-fat yogurt, and fruit ices instead of pastries, cake, and cookies. Sherbet, angel food cake, gelatin dessert, frozen low-fat yogurt, or other frozen products that do not contain saturated fat (pure fruit juice bars, frozen ice pops) are also acceptable.  COOKING METHODS Choose those methods that use little or no fat. They include: Poaching.  Braising.  Steaming.    Grilling.  Baking.  Stir-frying.  Broiling.  Microwaving.  Foods can be cooked in a nonstick pan without added fat, or use a nonfat cooking spray in regular cookware. Limit fried foods and avoid frying in saturated fat. Add moisture to lean meats by using water, broth, cooking wines, and other nonfat or low-fat sauces along with the cooking methods mentioned above. Soups and stews should be chilled after cooking. The fat that forms on top after a few hours in the refrigerator should be skimmed off. When preparing meals, avoid using excess salt. Salt can contribute to raising blood pressure in some people.  EATING AWAY FROM HOME Order entres, potatoes, and vegetables without sauces or butter. When meat exceeds the size of a deck of cards (3 to 4 ounces), the rest can be taken home for another meal. Choose vegetable or fruit salads and ask for low-calorie salad dressings to be served on the side. Use dressings sparingly. Limit high-fat toppings, such as bacon, crumbled eggs, cheese, sunflower seeds, and olives. Ask for heart-healthy tub margarine instead of butter.  High-Fiber Diet A high-fiber diet changes your normal diet to include more whole grains, legumes, fruits, and vegetables. Changes in the diet involve replacing refined carbohydrates with unrefined foods. The calorie level of the diet is essentially unchanged. The Dietary Reference Intake (recommended amount) for adult males is 38 grams per day. For adult females, it is 25 grams per day. Pregnant and lactating women should consume 28 grams of fiber per day. Fiber is the intact part of a plant that is not broken down during digestion. Functional fiber is fiber that has been isolated from the plant to provide a beneficial effect in the body. PURPOSE  Increase stool bulk.   Ease and regulate bowel movements.   Lower cholesterol.  INDICATIONS THAT YOU NEED MORE FIBER  Constipation and hemorrhoids.   Uncomplicated diverticulosis  (intestine condition) and irritable bowel syndrome.   Weight management.   As a protective measure against hardening of the arteries (atherosclerosis), diabetes, and cancer.   GUIDELINES FOR INCREASING FIBER IN THE DIET  Start adding fiber to the diet slowly. A gradual increase of about 5 more grams (2 slices of whole-wheat bread, 2 servings of most fruits or vegetables, or 1 bowl of high-fiber cereal) per day is best. Too rapid an increase in fiber may result in constipation, flatulence, and bloating.   Drink enough water and fluids to keep your urine clear or pale yellow. Water, juice, or caffeine-free drinks are recommended. Not drinking enough fluid may cause constipation.   Eat a variety of high-fiber foods rather than one  type of fiber.   Try to increase your intake of fiber through using high-fiber foods rather than fiber pills or supplements that contain small amounts of fiber.   The goal is to change the types of food eaten. Do not supplement your present diet with high-fiber foods, but replace foods in your present diet.  INCLUDE A VARIETY OF FIBER SOURCES  Replace refined and processed grains with whole grains, canned fruits with fresh fruits, and incorporate other fiber sources. White rice, white breads, and most bakery goods contain little or no fiber.   Brown whole-grain rice, buckwheat oats, and many fruits and vegetables are all good sources of fiber. These include: broccoli, Brussels sprouts, cabbage, cauliflower, beets, sweet potatoes, white potatoes (skin on), carrots, tomatoes, eggplant, squash, berries, fresh fruits, and dried fruits.   Cereals appear to be the richest source of fiber. Cereal fiber is found in whole grains and bran. Bran is the fiber-rich outer coat of cereal grain, which is largely removed in refining. In whole-grain cereals, the bran remains. In breakfast cereals, the largest amount of fiber is found in those with "bran" in their names. The fiber content  is sometimes indicated on the label.   You may need to include additional fruits and vegetables each day.   In baking, for 1 cup white flour, you may use the following substitutions:   1 cup whole-wheat flour minus 2 tablespoons.   1/2 cup white flour plus 1/2 cup whole-wheat flour.    Hiatal Hernia A hiatal hernia occurs when a part of the stomach slides above the diaphragm. The diaphragm is the thin muscle separating the belly (abdomen) from the chest. A hiatal hernia can be something you are born with or develop over time. Hiatal hernias may allow stomach acid to flow back into your esophagus, the tube which carries food from your mouth to your stomach. If this acid causes problems it is called GERD (gastro-esophageal reflux disease).   SYMPTOMS Common symptoms of GERD are heartburn (burning in your chest). This is worse when lying down or bending over. It may also cause belching and indigestion. Some of the things which make GERD worse are:  Increased weight pushes on stomach making acid rise more easily.   HOME CARE INSTRUCTIONS  Try to achieve and maintain an ideal body weight.   Avoid drinking alcoholic beverages.   DO NOT smokE.   Do not wear tight clothing around your chest or stomach.   Eat smaller meals and eat more frequently. This keeps your stomach from getting too full. Eat slowly.   Do not lie down for 2 or 3 hours after eating. Do not eat or drink anything 1 to 2 hours before going to bed.   Avoid caffeine beverages (colas, coffee, cocoa, tea), fatty foods, citrus fruits and all other foods and drinks that contain acid and that seem to increase the problems.   Avoid bending over, especially after eating OR STRAINING. Anything that increases the pressure in your belly increases the amount of acid that may be pushed up into your esophagus.   Polyps, Colon  A polyp is extra tissue that grows inside your body. Colon polyps grow in the large intestine. The large  intestine, also called the colon, is part of your digestive system. It is a long, hollow tube at the end of your digestive tract where your body makes and stores stool. Most polyps are not dangerous. They are benign. This means they are not cancerous. But over time,  some types of polyps can turn into cancer. Polyps that are smaller than a pea are usually not harmful. But larger polyps could someday become or may already be cancerous. To be safe, doctors remove all polyps and test them.   WHO GETS POLYPS? Anyone can get polyps, but certain people are more likely than others. You may have a greater chance of getting polyps if:  You are over 50.   You have had polyps before.   Someone in your family has had polyps.   Someone in your family has had cancer of the large intestine.   Find out if someone in your family has had polyps. You may also be more likely to get polyps if you:   Eat a lot of fatty foods   Smoke   Drink alcohol   Do not exercise  Eat too much   TREATMENT  The caregiver will remove the polyp during sigmoidoscopy or colonoscopy. The polyp is then tested for cancer.    PREVENTION There is not one sure way to prevent polyps. You might be able to lower your risk of getting them if you:  Eat more fruits and vegetables and less fatty food.   Do not smoke.   Avoid alcohol.   Exercise every day.   Lose weight if you are overweight.   Eating more calcium and folate can also lower your risk of getting polyps. Some foods that are rich in calcium are milk, cheese, and broccoli. Some foods that are rich in folate are chickpeas, kidney beans, and spinach.   Aspirin might help prevent polyps. Studies are under way.    Hemorrhoids Hemorrhoids are dilated (enlarged) veins around the rectum. Sometimes clots will form in the veins. This makes them swollen and painful. These are called thrombosed hemorrhoids. Causes of hemorrhoids include:  Constipation.   Straining to  have a bowel movement.   HEAVY LIFTING HOME CARE INSTRUCTIONS  Eat a well balanced diet and drink 6 to 8 glasses of water every day to avoid constipation. You may also use a bulk laxative.   Avoid straining to have bowel movements.   Keep anal area dry and clean.   Do not use a donut shaped pillow or sit on the toilet for long periods. This increases blood pooling and pain.   Move your bowels when your body has the urge; this will require less straining and will decrease pain and pressure.

## 2014-06-03 NOTE — H&P (Signed)
Primary Care Physician:  Rosita Fire, MD Primary Gastroenterologist:  Dr. Oneida Alar  Pre-Procedure History & Physical: HPI:  Jamie Hunt is a 63 y.o. female here for BRBPR/Dyspepsia/  Past Medical History  Diagnosis Date  . Hypertension   . Arthritis   . High cholesterol   . Shingles   . Acid reflux   . PONV (postoperative nausea and vomiting)     Past Surgical History  Procedure Laterality Date  . Abdominal hysterectomy    . Back surgery    . Cholecystectomy    . Vascular surgery      varicose veins  . Colonoscopy  05/08/2009    SLF:A few scattered diverticula throughout the entire colon.  Small  internal hemorrhoids.  Otherwise, no polyps, masses, inflammatory changes, or AVMs seen.  Normal retroflexed view of the rectum    Prior to Admission medications   Medication Sig Start Date End Date Taking? Authorizing Provider  Ascorbic Acid (VITAMIN C PO) Take 1 tablet by mouth daily.   Yes Historical Provider, MD  aspirin EC 81 MG tablet Take 81 mg by mouth daily.   Yes Historical Provider, MD  CALCIUM PO Take 1 tablet by mouth daily.   Yes Historical Provider, MD  Cyanocobalamin (VITAMIN B-12 PO) Take 1 tablet by mouth daily.   Yes Historical Provider, MD  famciclovir (FAMVIR) 250 MG tablet Take 250 mg by mouth 2 (two) times daily.   Yes Historical Provider, MD  ferrous sulfate (IRON SUPPLEMENT) 325 (65 FE) MG tablet Take 325 mg by mouth daily with breakfast.   Yes Historical Provider, MD  fexofenadine (ALLEGRA) 180 MG tablet Take 180 mg by mouth daily.   Yes Historical Provider, MD  hydrALAZINE (APRESOLINE) 25 MG tablet Take 25 mg by mouth 2 (two) times daily.   Yes Historical Provider, MD  hydrochlorothiazide (HYDRODIURIL) 25 MG tablet Take 25 mg by mouth daily.   Yes Historical Provider, MD  lovastatin (MEVACOR) 20 MG tablet Take 20 mg by mouth at bedtime.   Yes Historical Provider, MD  Multiple Vitamins-Minerals (ONE-A-DAY 50 PLUS) TABS Take 1 tablet by mouth daily.   Yes  Historical Provider, MD  nabumetone (RELAFEN) 750 MG tablet Take 750 mg by mouth 2 (two) times daily.   Yes Historical Provider, MD  Omega-3 Fatty Acids (FISH OIL) 1200 MG CAPS Take 2 capsules by mouth daily.   Yes Historical Provider, MD  omeprazole (PRILOSEC) 20 MG capsule Take 20 mg by mouth daily.   Yes Historical Provider, MD  polyethylene glycol-electrolytes (TRILYTE) 420 G solution Take 4,000 mLs by mouth as directed. 05/28/14  Yes Danie Binder, MD  quinapril (ACCUPRIL) 40 MG tablet Take 40 mg by mouth daily.    Yes Historical Provider, MD  verapamil (VERELAN PM) 240 MG 24 hr capsule Take 240 mg by mouth daily.   Yes Historical Provider, MD  HYDROcodone-acetaminophen (NORCO) 7.5-325 MG per tablet Take 1 tablet by mouth 2 (two) times daily as needed for pain.     Historical Provider, MD    Allergies as of 05/28/2014 - Review Complete 05/28/2014  Allergen Reaction Noted  . Codeine Nausea And Vomiting 04/22/2014  . Penicillins  11/30/2011    Family History  Problem Relation Age of Onset  . Colon cancer Neg Hx   . Liver disease Neg Hx     History   Social History  . Marital Status: Widowed    Spouse Name: N/A    Number of Children: N/A  . Years of Education:  N/A   Occupational History  . Not on file.   Social History Main Topics  . Smoking status: Never Smoker   . Smokeless tobacco: Not on file  . Alcohol Use: No  . Drug Use: No  . Sexual Activity: Not on file   Other Topics Concern  . Not on file   Social History Narrative  . No narrative on file    Review of Systems: See HPI, otherwise negative ROS   Physical Exam: BP 146/91  Temp(Src) 98.4 F (36.9 C) (Oral)  Resp 13  Ht 5\' 10"  (1.778 m)  Wt 213 lb (96.616 kg)  BMI 30.56 kg/m2  SpO2 97% General:   Alert,  pleasant and cooperative in NAD Head:  Normocephalic and atraumatic. Neck:  Supple; Lungs:  Clear throughout to auscultation.    Heart:  Regular rate and rhythm. Abdomen:  Soft, nontender and  nondistended. Normal bowel sounds, without guarding, and without rebound.   Neurologic:  Alert and  oriented x4;  grossly normal neurologically.  Impression/Plan:     BRBPR/Dyspepsia  PLAN: TCS/EGD TODAY

## 2014-06-06 ENCOUNTER — Encounter (HOSPITAL_COMMUNITY): Payer: Self-pay | Admitting: Gastroenterology

## 2014-06-12 ENCOUNTER — Telehealth: Payer: Self-pay

## 2014-06-12 NOTE — Telephone Encounter (Signed)
Pt called for results of her TCS and EGD done on 06/03/2014.

## 2014-06-13 ENCOUNTER — Ambulatory Visit: Payer: BC Managed Care – PPO | Admitting: Gastroenterology

## 2014-06-13 NOTE — Telephone Encounter (Signed)
PATIENT NIC'D FOR FU AND COLONOSCOPY

## 2014-06-13 NOTE — Telephone Encounter (Signed)
Please call pt. She had HYPERPLASTIC POLYPS removed.   CONTINUE YOUR WEIGHT LOSS EFFORTS. LOSE 10 LBS.  DRINK WATER TO KEEP YOUR URINE LIGHT YELLOW.  CONTINUE OMEPRAZOLE.  TAKE 30 MINUTES PRIOR TO YOUR FIRST MEAL. INCREASE TO TWICE DAILY IF HER HEARTBURN OR ABDOMINAL PAIN ARE NOT IDEALLY CONTROLLED.  FOLLOW A Low fat/HIGH FIBER DIET. AVOID ITEMS THAT CAUSE BLOATING.   FOLLOW UP IN DEC 2015 E30 RECTAL BLEEDING/GERD.Marland Kitchen  NEXT TCS IN 10 YEARS WITH AN OVERTUBE.

## 2014-06-19 NOTE — Telephone Encounter (Signed)
Called and LMOM the results and told pt to call if she has questions.

## 2014-08-20 ENCOUNTER — Encounter: Payer: Self-pay | Admitting: Gastroenterology

## 2014-10-25 ENCOUNTER — Ambulatory Visit (INDEPENDENT_AMBULATORY_CARE_PROVIDER_SITE_OTHER): Payer: BLUE CROSS/BLUE SHIELD | Admitting: Gastroenterology

## 2014-10-25 ENCOUNTER — Encounter: Payer: Self-pay | Admitting: Gastroenterology

## 2014-10-25 VITALS — BP 150/80 | HR 68 | Temp 97.8°F | Ht 71.0 in | Wt 203.2 lb

## 2014-10-25 DIAGNOSIS — K219 Gastro-esophageal reflux disease without esophagitis: Secondary | ICD-10-CM

## 2014-10-25 DIAGNOSIS — K625 Hemorrhage of anus and rectum: Secondary | ICD-10-CM

## 2014-10-25 MED ORDER — OMEPRAZOLE 20 MG PO CPDR: 20.0000 mg | DELAYED_RELEASE_CAPSULE | Freq: Every day | ORAL | Status: DC

## 2014-10-25 NOTE — Assessment & Plan Note (Signed)
Doing better. No longer on Relafen. Continues PPI daily. Discussed antireflux measures. She has lost 20 pounds intentionally over the last 2 years. Return to the office in one year or sooner if needed.

## 2014-10-25 NOTE — Assessment & Plan Note (Signed)
No further rectal bleeding. Possibly diverticular bleed previously versus hemorrhoids. Discussed CR H banding if her hemorrhoids become more problematic. Return to the office in one year or sooner if needed.

## 2014-10-25 NOTE — Patient Instructions (Signed)
Continue Prilosec once daily. Return to the office in one year or sooner if needed.

## 2014-10-25 NOTE — Progress Notes (Signed)
Primary Care Physician:  Rosita Fire, MD  Primary Gastroenterologist:  Barney Drain, MD   Chief Complaint  Patient presents with  . Rectal Bleeding    HPI:  Jamie Hunt is a 64 y.o. female here EGD and colonoscopy back in August for dyspepsia and rectal bleeding. She had a small hiatal hernia, 6 colon polyps removed each were benign without adenomatous changes, moderate sized internal hemorrhoids, moderate diverticulosis. No longer on Relafen.  Doing well. Rare heartburn or abdominal pain. Appetite good. No dysphagia. BM constipation off/on. Takes Benefiber when necessary. No unintentional weight loss. No further rectal bleeding. No melena.  Current Outpatient Prescriptions  Medication Sig Dispense Refill  . Ascorbic Acid (VITAMIN C PO) Take 1 tablet by mouth daily.    Marland Kitchen aspirin EC 81 MG tablet Take 81 mg by mouth daily.    Marland Kitchen CALCIUM PO Take 1 tablet by mouth daily.    . Cyanocobalamin (VITAMIN B-12 PO) Take 1 tablet by mouth daily.    . famciclovir (FAMVIR) 250 MG tablet Take 250 mg by mouth 2 (two) times daily.    . ferrous sulfate (IRON SUPPLEMENT) 325 (65 FE) MG tablet Take 325 mg by mouth daily with breakfast.    . fexofenadine (ALLEGRA) 180 MG tablet Take 180 mg by mouth daily.    . hydrALAZINE (APRESOLINE) 25 MG tablet Take 25 mg by mouth 2 (two) times daily.    . hydrochlorothiazide (HYDRODIURIL) 25 MG tablet Take 25 mg by mouth daily.    Marland Kitchen HYDROcodone-acetaminophen (NORCO) 7.5-325 MG per tablet Take 1 tablet by mouth 2 (two) times daily as needed for pain.     Marland Kitchen lovastatin (MEVACOR) 20 MG tablet Take 20 mg by mouth at bedtime.    . Multiple Vitamins-Minerals (ONE-A-DAY 50 PLUS) TABS Take 1 tablet by mouth daily.    . nabumetone (RELAFEN) 750 MG tablet Take 750 mg by mouth 2 (two) times daily.    . Omega-3 Fatty Acids (FISH OIL) 1200 MG CAPS Take 2 capsules by mouth daily.    Marland Kitchen omeprazole (PRILOSEC) 20 MG capsule Take 20 mg by mouth daily.    . quinapril (ACCUPRIL) 40 MG  tablet Take 40 mg by mouth daily.     . verapamil (VERELAN PM) 240 MG 24 hr capsule Take 240 mg by mouth daily.     No current facility-administered medications for this visit.    Allergies as of 10/25/2014 - Review Complete 10/25/2014  Allergen Reaction Noted  . Codeine Nausea And Vomiting 04/22/2014  . Penicillins  11/30/2011    Past Medical History  Diagnosis Date  . Hypertension   . Arthritis   . High cholesterol   . Shingles   . Acid reflux   . PONV (postoperative nausea and vomiting)     Past Surgical History  Procedure Laterality Date  . Abdominal hysterectomy    . Back surgery    . Cholecystectomy    . Vascular surgery      varicose veins  . Colonoscopy  05/08/2009    SLF:A few scattered diverticula throughout the entire colon.  Small  internal hemorrhoids.  Otherwise, no polyps, masses, inflammatory changes, or AVMs seen.  Normal retroflexed view of the rectum  . Colonoscopy N/A 06/03/2014    SLF:  1. six colon polyps removed. 2. Moderate sized internal hemorrhoids. 3. The left colon is redundant 4.  Moderate diverticulosis throughout the entire examed colon. next TCS in10 years with overtube.  . Esophagogastroduodenoscopy N/A 06/03/2014    SLF:  1. small hiatal hernia    Family History  Problem Relation Age of Onset  . Colon cancer Neg Hx   . Liver disease Neg Hx     History   Social History  . Marital Status: Widowed    Spouse Name: N/A    Number of Children: N/A  . Years of Education: N/A   Occupational History  . Not on file.   Social History Main Topics  . Smoking status: Never Smoker   . Smokeless tobacco: Not on file  . Alcohol Use: No  . Drug Use: No  . Sexual Activity: Not on file   Other Topics Concern  . Not on file   Social History Narrative      ROS:  General: Negative for anorexia, weight loss, fever, chills, fatigue, weakness. Eyes: Negative for vision changes.  ENT: Negative for hoarseness, difficulty swallowing , nasal  congestion. CV: Negative for chest pain, angina, palpitations, dyspnea on exertion, peripheral edema.  Respiratory: Negative for dyspnea at rest, dyspnea on exertion, cough, sputum, wheezing.  GI: See history of present illness. GU:  Negative for dysuria, hematuria, urinary incontinence, urinary frequency, nocturnal urination.  MS: Negative for joint pain, low back pain.  Derm: Negative for rash or itching.  Neuro: Negative for weakness, abnormal sensation, seizure, frequent headaches, memory loss, confusion.  Psych: Negative for anxiety, depression, suicidal ideation, hallucinations.  Endo: Negative for unusual weight change.  Heme: Negative for bruising or bleeding. Allergy: Negative for rash or hives.    Physical Examination:  BP 150/80 mmHg  Pulse 68  Temp(Src) 97.8 F (36.6 C) (Oral)  Ht 5\' 11"  (1.803 m)  Wt 203 lb 3.2 oz (92.171 kg)  BMI 28.35 kg/m2   General: Well-nourished, well-developed in no acute distress.  Head: Normocephalic, atraumatic.   Eyes: Conjunctiva pink, no icterus. Mouth: Oropharyngeal mucosa moist and pink , no lesions erythema or exudate. Neck: Supple without thyromegaly, masses, or lymphadenopathy.  Lungs: Clear to auscultation bilaterally.  Heart: Regular rate and rhythm, no murmurs rubs or gallops.  Abdomen: Bowel sounds are normal, nontender, nondistended, no hepatosplenomegaly or masses, no abdominal bruits or    hernia , no rebound or guarding.   Rectal: not performed Extremities: No lower extremity edema. No clubbing or deformities.  Neuro: Alert and oriented x 4 , grossly normal neurologically.  Skin: Warm and dry, no rash or jaundice.   Psych: Alert and cooperative, normal mood and affect.

## 2014-10-30 NOTE — Progress Notes (Signed)
cc'ed to pcp °

## 2014-11-05 ENCOUNTER — Other Ambulatory Visit (HOSPITAL_COMMUNITY): Payer: Self-pay | Admitting: Internal Medicine

## 2014-11-05 DIAGNOSIS — Z1231 Encounter for screening mammogram for malignant neoplasm of breast: Secondary | ICD-10-CM

## 2014-12-05 ENCOUNTER — Ambulatory Visit (HOSPITAL_COMMUNITY)
Admission: RE | Admit: 2014-12-05 | Discharge: 2014-12-05 | Disposition: A | Discharge status: Home / Self Care | Payer: BLUE CROSS/BLUE SHIELD | Source: Ambulatory Visit | Attending: Internal Medicine | Admitting: Internal Medicine

## 2014-12-05 DIAGNOSIS — Z1231 Encounter for screening mammogram for malignant neoplasm of breast: Secondary | ICD-10-CM | POA: Insufficient documentation

## 2015-09-24 ENCOUNTER — Encounter: Payer: Self-pay | Admitting: Gastroenterology

## 2015-10-30 ENCOUNTER — Encounter: Payer: Self-pay | Admitting: Gastroenterology

## 2015-10-30 ENCOUNTER — Ambulatory Visit (INDEPENDENT_AMBULATORY_CARE_PROVIDER_SITE_OTHER): Payer: BLUE CROSS/BLUE SHIELD | Admitting: Gastroenterology

## 2015-10-30 VITALS — BP 148/87 | HR 71 | Temp 97.1°F | Ht 68.0 in | Wt 218.4 lb

## 2015-10-30 DIAGNOSIS — R1011 Right upper quadrant pain: Secondary | ICD-10-CM

## 2015-10-30 DIAGNOSIS — R1010 Upper abdominal pain, unspecified: Secondary | ICD-10-CM | POA: Diagnosis not present

## 2015-10-30 DIAGNOSIS — K625 Hemorrhage of anus and rectum: Secondary | ICD-10-CM | POA: Diagnosis not present

## 2015-10-30 DIAGNOSIS — R1012 Left upper quadrant pain: Principal | ICD-10-CM

## 2015-10-30 NOTE — Progress Notes (Signed)
Subjective:    Patient ID: Jamie Hunt, female    DOB: 23-Apr-1951, 65 y.o.   MRN: [id number]  [name],[name], MD  HPI LUQ ABDOMINAL PAIN WHEN SHE DOESN'T EAT THE RIGHT THING. MAY LAST COUPLE MINS THEN MOVE ON. FEEL LIKE A PRESSURE. MOST TIME IN THE SAME SPOT. RARELY IN RUQ. WEIGHT LOSS: NO. SEEMED LIKE SHE GAINED. SWITCHED FROM RELAFEN TO DICLOFENAC 6 MOS AGO, ASA DAILY. PRILOSEC EVERY DAY. BMS: 2-3 TIMES A DAY AND MAY GO A DAY W/O ONE(NL, UNLESS EAST THE WRONG THING THEN DIARRHEA). HEARTBURN: 1-2X/MO DEPENDING ON WHAT SHE EATS. CHEST PAIN WHEN EATS THE WRONG THE THING.   PT DENIES FEVER, CHILLS, HEMATOCHEZIA, HEMATEMESIS, nausea, vomiting, melena, SHORTNESS OF BREATH,  CHANGE IN BOWEL IN HABITS, CONSTIPATION,OR problems swallowing.  Past Medical History  Diagnosis Date  . Hypertension   . Arthritis   . High cholesterol   . Shingles   . Acid reflux   . PONV (postoperative nausea and vomiting)    Past Surgical History  Procedure Laterality Date  . Abdominal hysterectomy    . Back surgery    . Cholecystectomy    . Vascular surgery      varicose veins  . Colonoscopy  05/08/2009    SLF:A few scattered diverticula throughout the entire colon.  Small  internal hemorrhoids.  Otherwise, no polyps, masses, inflammatory changes, or AVMs seen.  Normal retroflexed view of the rectum  . Colonoscopy N/A 06/03/2014    SLF:  1. six colon polyps removed. 2. Moderate sized internal hemorrhoids. 3. The left colon is redundant 4.  Moderate diverticulosis throughout the entire examed colon. next TCS in10 years with overtube.  . Esophagogastroduodenoscopy N/A 06/03/2014    SLF:  1. small hiatal hernia   Allergies  Allergen Reactions  . Codeine Nausea And Vomiting  . Penicillins     Yeast infection   Current Outpatient Prescriptions  Medication Sig Dispense Refill  . Ascorbic Acid (VITAMIN C PO) Take 1 tablet by mouth daily.    Marland Kitchen aspirin EC 81 MG tablet Take 81 mg by mouth daily.    Marland Kitchen CALCIUM  PO Take 1 tablet by mouth daily.    . Cyanocobalamin (VITAMIN B-12 PO) Take 1 tablet by mouth daily.    . diclofenac (VOLTAREN) 75 MG EC tablet     . famciclovir (FAMVIR) 250 MG tablet Take 250 mg by mouth 2 (two) times daily.    . ferrous sulfate  325 (65 FE) MG tablet Take 325 mg by mouth daily with breakfast.    . fexofenadine (ALLEGRA) 180 MG tablet Take 180 mg by mouth daily.    . hydrALAZINE (APRESOLINE) 25 MG tablet Take 25 mg by mouth 2 (two) times daily.    . hydrochlorothiazide (HYDRODIURIL) 25 MG tablet Take 25 mg by mouth daily.     NORCO Take 1 tablet by mouth 2 (two) times daily as needed for pain.     Marland Kitchen losartan (COZAAR) 100 MG tablet     . lovastatin (MEVACOR) 20 MG tablet Take 20 mg by mouth at bedtime.    . Multiple Vitamins-Minerals (ONE-A-DAY 50 PLUS) TABS Take 1 tablet by mouth daily.    . Omega-3 Fatty Acids (FISH OIL) 1200 MG CAPS Take 2 capsules by mouth daily.    Marland Kitchen omeprazole (PRILOSEC) 20 MG capsule Take 1 capsule (20 mg total) by mouth daily.    . verapamil (VERELAN PM) 240 MG 24 hr capsule Take 240 mg by mouth daily. Reported  on 10/30/2015    .      . quinapril (ACCUPRIL) 40 MG tablet Take 40 mg by mouth daily. Reported on 10/30/2015     Review of Systems PER HPI OTHERWISE ALL SYSTEMS ARE NEGATIVE.    Objective:   Physical Exam  Constitutional: She is oriented to person, place, and time. She appears well-developed and well-nourished. No distress.  HENT:  Head: Normocephalic and atraumatic.  Mouth/Throat: Oropharynx is clear and moist. No oropharyngeal exudate.  Eyes: Pupils are equal, round, and reactive to light. No scleral icterus.  Neck: Normal range of motion. Neck supple.  Cardiovascular: Normal rate, regular rhythm and normal heart sounds.   Pulmonary/Chest: Effort normal and breath sounds normal. No respiratory distress.  Abdominal: Soft. Bowel sounds are normal. She exhibits no distension. There is no tenderness.  Musculoskeletal: She exhibits edema  (TRACE BIL LE).  Lymphadenopathy:    She has no cervical adenopathy.  Neurological: She is alert and oriented to person, place, and time.  NO FOCAL DEFICITS  Psychiatric: She has a normal mood and affect.  Vitals reviewed.     Assessment & Plan:

## 2015-10-30 NOTE — Progress Notes (Signed)
REVIEWED-NO ADDITIONAL RECOMMENDATIONS. 

## 2015-10-30 NOTE — Assessment & Plan Note (Signed)
SYMPTOMS CONTROLLED/RESOLVED.  CONTINUE TO MONITOR SYMPTOMS. DISCUSSED PROCEDURE, BENEFITS, AND MANAGEMENT OF HEMORRHOIDS.

## 2015-10-30 NOTE — Patient Instructions (Addendum)
CONTINUE YOUR WEIGHT LOSS EFFORTS. LOSE 10 POUNDS.  DRINK WATER TO KEEP YOUR URINE LIGHT YELLOW.  CUT YOUR PORTIONS.  FOLLOW A MEATS SHOULD BE BAKED, BROILED, OR BOILED. AVOID FRIED FOODS. AVOID ITEMS THAT CAUSE BLOATING & GAS. SEE INFO BELOW. HIGH FIBER/LOW FAT DIET.  INCREASE OMEPRAZOLE.  TAKE 30 MINUTES PRIOR TO YOUR MEALS TWICE DAILY.  PLEASE CALL IN ONE MONTH IF YOUR ABDOMINAL PAIN IS NOT BETTER.  FOLLOW UP IN 3 MOS.    High-Fiber Diet A high-fiber diet changes your normal diet to include more whole grains, legumes, fruits, and vegetables. Changes in the diet involve replacing refined carbohydrates with unrefined foods. The calorie level of the diet is essentially unchanged. The Dietary Reference Intake (recommended amount) for adult males is 38 grams per day. For adult females, it is 25 grams per day. Pregnant and lactating women should consume 28 grams of fiber per day. Fiber is the intact part of a plant that is not broken down during digestion. Functional fiber is fiber that has been isolated from the plant to provide a beneficial effect in the body. PURPOSE  Increase stool bulk.   Ease and regulate bowel movements.   Lower cholesterol.  REDUCE RISK OF COLON CANCER  INDICATIONS THAT YOU NEED MORE FIBER  Constipation and hemorrhoids.   Uncomplicated diverticulosis (intestine condition) and irritable bowel syndrome.   Weight management.   As a protective measure against hardening of the arteries (atherosclerosis), diabetes, and cancer.   GUIDELINES FOR INCREASING FIBER IN THE DIET  Start adding fiber to the diet slowly. A gradual increase of about 5 more grams (2 slices of whole-wheat bread, 2 servings of most fruits or vegetables, or 1 bowl of high-fiber cereal) per day is best. Too rapid an increase in fiber may result in constipation, flatulence, and bloating.   Drink enough water and fluids to keep your urine clear or pale yellow. Water, juice, or caffeine-free  drinks are recommended. Not drinking enough fluid may cause constipation.   Eat a variety of high-fiber foods rather than one type of fiber.   Try to increase your intake of fiber through using high-fiber foods rather than fiber pills or supplements that contain small amounts of fiber.   The goal is to change the types of food eaten. Do not supplement your present diet with high-fiber foods, but replace foods in your present diet.   INCLUDE A VARIETY OF FIBER SOURCES  Replace refined and processed grains with whole grains, canned fruits with fresh fruits, and incorporate other fiber sources. White rice, white breads, and most bakery goods contain little or no fiber.   Brown whole-grain rice, buckwheat oats, and many fruits and vegetables are all good sources of fiber. These include: broccoli, Brussels sprouts, cabbage, cauliflower, beets, sweet potatoes, white potatoes (skin on), carrots, tomatoes, eggplant, squash, berries, fresh fruits, and dried fruits.   Cereals appear to be the richest source of fiber. Cereal fiber is found in whole grains and bran. Bran is the fiber-rich outer coat of cereal grain, which is largely removed in refining. In whole-grain cereals, the bran remains. In breakfast cereals, the largest amount of fiber is found in those with "bran" in their names. The fiber content is sometimes indicated on the label.   You may need to include additional fruits and vegetables each day.   In baking, for 1 cup white flour, you may use the following substitutions:   1 cup whole-wheat flour minus 2 tablespoons.   1/2 cup white  flour plus 1/2 cup whole-wheat flour.   Low-Fat Diet BREADS, CEREALS, PASTA, RICE, DRIED PEAS, AND BEANS These products are high in carbohydrates and most are low in fat. Therefore, they can be increased in the diet as substitutes for fatty foods. They too, however, contain calories and should not be eaten in excess. Cereals can be eaten for snacks as well as  for breakfast.   FRUITS AND VEGETABLES It is good to eat fruits and vegetables. Besides being sources of fiber, both are rich in vitamins and some minerals. They help you get the daily allowances of these nutrients. Fruits and vegetables can be used for snacks and desserts.  MEATS Limit lean meat, chicken, [place], and fish to no more than 6 ounces per day. Beef, Pork, and Lamb Use lean cuts of beef, pork, and lamb. Lean cuts include:  Extra-lean ground beef.  Arm roast.  Sirloin tip.  Center-cut ham.  Round steak.  Loin chops.  Rump roast.  Tenderloin.  Trim all fat off the outside of meats before cooking. It is not necessary to severely decrease the intake of red meat, but lean choices should be made. Lean meat is rich in protein and contains a highly absorbable form of iron. Premenopausal women, in particular, should avoid reducing lean red meat because this could increase the risk for low red blood cells (iron-deficiency anemia).  Chicken and [place] These are good sources of protein. The fat of poultry can be reduced by removing the skin and underlying fat layers before cooking. Chicken and [place] can be substituted for lean red meat in the diet. Poultry should not be fried or covered with high-fat sauces. Fish and Shellfish Fish is a good source of protein. Shellfish contain cholesterol, but they usually are low in saturated fatty acids. The preparation of fish is important. Like chicken and [place], they should not be fried or covered with high-fat sauces. EGGS Egg whites contain no fat or cholesterol. They can be eaten often. Try 1 to 2 egg whites instead of whole eggs in recipes or use egg substitutes that do not contain yolk. MILK AND DAIRY PRODUCTS Use skim or 1% milk instead of 2% or whole milk. Decrease whole milk, natural, and processed cheeses. Use nonfat or low-fat (2%) cottage cheese or low-fat cheeses made from vegetable oils. Choose nonfat or low-fat (1 to 2%) yogurt.  Experiment with evaporated skim milk in recipes that call for heavy cream. Substitute low-fat yogurt or low-fat cottage cheese for sour cream in dips and salad dressings. Have at least 2 servings of low-fat dairy products, such as 2 glasses of skim (or 1%) milk each day to help get your daily calcium intake. FATS AND OILS Reduce the total intake of fats, especially saturated fat. Butterfat, lard, and beef fats are high in saturated fat and cholesterol. These should be avoided as much as possible. Vegetable fats do not contain cholesterol, but certain vegetable fats, such as coconut oil, palm oil, and palm kernel oil are very high in saturated fats. These should be limited. These fats are often used in bakery goods, processed foods, popcorn, oils, and nondairy creamers. Vegetable shortenings and some peanut butters contain hydrogenated oils, which are also saturated fats. Read the labels on these foods and check for saturated vegetable oils. Unsaturated vegetable oils and fats do not raise blood cholesterol. However, they should be limited because they are fats and are high in calories. Total fat should still be limited to 30% of your daily caloric intake.  Desirable liquid vegetable oils are corn oil, cottonseed oil, olive oil, canola oil, safflower oil, soybean oil, and sunflower oil. Peanut oil is not as good, but small amounts are acceptable. Buy a heart-healthy tub margarine that has no partially hydrogenated oils in the ingredients. Mayonnaise and salad dressings often are made from unsaturated fats, but they should also be limited because of their high calorie and fat content. Seeds, nuts, peanut butter, olives, and avocados are high in fat, but the fat is mainly the unsaturated type. These foods should be limited mainly to avoid excess calories and fat. OTHER EATING TIPS Snacks  Most sweets should be limited as snacks. They tend to be rich in calories and fats, and their caloric content outweighs their  nutritional value. Some good choices in snacks are graham crackers, melba toast, soda crackers, bagels (no egg), English muffins, fruits, and vegetables. These snacks are preferable to snack crackers, [place] fries, TORTILLA CHIPS, and POTATO chips. Popcorn should be air-popped or cooked in small amounts of liquid vegetable oil. Desserts Eat fruit, low-fat yogurt, and fruit ices instead of pastries, cake, and cookies. Sherbet, angel food cake, gelatin dessert, frozen low-fat yogurt, or other frozen products that do not contain saturated fat (pure fruit juice bars, frozen ice pops) are also acceptable.  COOKING METHODS Choose those methods that use little or no fat. They include: Poaching.  Braising.  Steaming.  Grilling.  Baking.  Stir-frying.  Broiling.  Microwaving.  Foods can be cooked in a nonstick pan without added fat, or use a nonfat cooking spray in regular cookware. Limit fried foods and avoid frying in saturated fat. Add moisture to lean meats by using water, broth, cooking wines, and other nonfat or low-fat sauces along with the cooking methods mentioned above. Soups and stews should be chilled after cooking. The fat that forms on top after a few hours in the refrigerator should be skimmed off. When preparing meals, avoid using excess salt. Salt can contribute to raising blood pressure in some people.  EATING AWAY FROM HOME Order entres, potatoes, and vegetables without sauces or butter. When meat exceeds the size of a deck of cards (3 to 4 ounces), the rest can be taken home for another meal. Choose vegetable or fruit salads and ask for low-calorie salad dressings to be served on the side. Use dressings sparingly. Limit high-fat toppings, such as bacon, crumbled eggs, cheese, sunflower seeds, and olives. Ask for heart-healthy tub margarine instead of butter.

## 2015-10-30 NOTE — Assessment & Plan Note (Addendum)
SYMPTOMS NOT CONTROLLED & MOST LIKELY DUE TO NSAID GASTRITIS/GERD, LESS LIKELY GASTRIC/PANCREATIC CA OR SIBO.  DRINK WATER TO KEEP YOUR URINE LIGHT YELLOW. CONTINUE YOUR WEIGHT LOSS EFFORTS. LOSE 10 LBS. CUT YOUR PORTIONS. FOLLOW A MEATS SHOULD BE BAKED, BROILED, OR BOILED. AVOID FRIED FOODS. AVOID ITEMS THAT CAUSE BLOATING & GAS. SEE INFO BELOW. HIGH FIBER/LOW FAT DIET. INCREASE OMEPRAZOLE.  TAKE 30 MINUTES PRIOR TO YOUR MEALS TWICE DAILY. PLEASE CALL IN ONE MONTH IF YOUR ABDOMINAL PAIN IS NOT BETTER. FOLLOW UP IN 3 MOS.   GREATER THAN 50% WAS SPENT IN COUNSELING & COORDINATION OF CARE WITH THE PATIENT: DISCUSSED DIFFERENTIAL DIAGNOSIS, BENEFITS, RISKS, AND MANAGEMENT OF POSTPRANDIAL ABDOMINAL PAIN/NSAID GASTRITIS. TOTAL ENCOUNTER TIME: 25 MINS.

## 2015-10-31 ENCOUNTER — Other Ambulatory Visit (HOSPITAL_COMMUNITY): Payer: Self-pay | Admitting: Internal Medicine

## 2015-10-31 DIAGNOSIS — Z1231 Encounter for screening mammogram for malignant neoplasm of breast: Secondary | ICD-10-CM

## 2015-10-31 NOTE — Progress Notes (Signed)
ON RECALL  °

## 2015-11-03 NOTE — Progress Notes (Signed)
cc'ed to pcp °

## 2015-12-08 ENCOUNTER — Other Ambulatory Visit (HOSPITAL_COMMUNITY): Payer: Self-pay | Admitting: Internal Medicine

## 2015-12-08 ENCOUNTER — Ambulatory Visit (HOSPITAL_COMMUNITY)
Admission: RE | Admit: 2015-12-08 | Discharge: 2015-12-08 | Disposition: A | Discharge status: Home / Self Care | Payer: BLUE CROSS/BLUE SHIELD | Source: Ambulatory Visit | Attending: Internal Medicine | Admitting: Internal Medicine

## 2015-12-08 ENCOUNTER — Ambulatory Visit (HOSPITAL_COMMUNITY): Payer: BLUE CROSS/BLUE SHIELD

## 2015-12-08 DIAGNOSIS — Z1231 Encounter for screening mammogram for malignant neoplasm of breast: Secondary | ICD-10-CM | POA: Diagnosis present

## 2015-12-30 ENCOUNTER — Encounter: Payer: Self-pay | Admitting: Gastroenterology

## 2016-01-13 ENCOUNTER — Ambulatory Visit (INDEPENDENT_AMBULATORY_CARE_PROVIDER_SITE_OTHER): Payer: BLUE CROSS/BLUE SHIELD | Admitting: Orthopaedic Surgery

## 2016-01-13 ENCOUNTER — Encounter: Payer: Self-pay | Admitting: Orthopaedic Surgery

## 2016-01-13 VITALS — BP 147/87 | HR 74 | Temp 97.5°F | Resp 16 | Ht 68.0 in | Wt 216.0 lb

## 2016-01-13 DIAGNOSIS — M25512 Pain in left shoulder: Secondary | ICD-10-CM | POA: Diagnosis not present

## 2016-01-13 DIAGNOSIS — M25561 Pain in right knee: Secondary | ICD-10-CM

## 2016-01-13 DIAGNOSIS — M25511 Pain in right shoulder: Secondary | ICD-10-CM | POA: Diagnosis not present

## 2016-01-13 MED ORDER — DICLOFENAC SODIUM 75 MG PO TBEC: 75.0000 mg | DELAYED_RELEASE_TABLET | Freq: Two times a day (BID) | ORAL | Status: DC

## 2016-01-13 NOTE — Progress Notes (Signed)
Patient [id number] Jamie Hunt, female DOB:July 14, 1951, 65 y.o. [id number]  Chief Complaint  Patient presents with  . Follow-up    follow up right knee and bilateral shoulder    HPI  Jamie Hunt is a 65 y.o. female who has bilateral shoulder pain and right knee pain.  Her right knee is doing well.  She uses a knee brace.  She has no new trauma, no giving way, no redness. She has swelling and popping.  Her shoulders are doing well also.  She is doing her exercises and taking her medicine.  She has no paresthesias.  HPI  Body mass index is 32.85 kg/(m^2).  Review of Systems  HENT: Negative for congestion.   Respiratory: Negative for cough and shortness of breath.   Cardiovascular: Negative for chest pain and leg swelling.  Endocrine: Positive for cold intolerance.  Musculoskeletal: Positive for joint swelling, arthralgias and gait problem.  Allergic/Immunologic: Negative for environmental allergies.    Past Medical History  Diagnosis Date  . Hypertension   . Arthritis   . High cholesterol   . Shingles   . Acid reflux   . PONV (postoperative nausea and vomiting)     Past Surgical History  Procedure Laterality Date  . Abdominal hysterectomy    . Back surgery    . Cholecystectomy    . Vascular surgery      varicose veins  . Colonoscopy  05/08/2009    SLF:A few scattered diverticula throughout the entire colon.  Small  internal hemorrhoids.  Otherwise, no polyps, masses, inflammatory changes, or AVMs seen.  Normal retroflexed view of the rectum  . Colonoscopy N/A 06/03/2014    SLF:  1. six colon polyps removed. 2. Moderate sized internal hemorrhoids. 3. The left colon is redundant 4.  Moderate diverticulosis throughout the entire examed colon. next TCS in10 years with overtube.  . Esophagogastroduodenoscopy N/A 06/03/2014    SLF:  1. small hiatal hernia    Family History  Problem Relation Age of Onset  . Colon cancer Neg Hx   . Liver disease Neg Hx     Social  History Social History  Substance Use Topics  . Smoking status: Never Smoker   . Smokeless tobacco: None  . Alcohol Use: No    Allergies  Allergen Reactions  . Codeine Nausea And Vomiting  . Penicillins     Yeast infection    Current Outpatient Prescriptions  Medication Sig Dispense Refill  . Ascorbic Acid (VITAMIN C PO) Take 1 tablet by mouth daily.    Marland Kitchen aspirin EC 81 MG tablet Take 81 mg by mouth daily.    Marland Kitchen CALCIUM PO Take 1 tablet by mouth daily.    . Cyanocobalamin (VITAMIN B-12 PO) Take 1 tablet by mouth daily.    . famciclovir (FAMVIR) 250 MG tablet Take 250 mg by mouth 2 (two) times daily.    . ferrous sulfate (IRON SUPPLEMENT) 325 (65 FE) MG tablet Take 325 mg by mouth daily with breakfast.    . fexofenadine (ALLEGRA) 180 MG tablet Take 180 mg by mouth daily.    . hydrALAZINE (APRESOLINE) 25 MG tablet Take 25 mg by mouth 2 (two) times daily.    . hydrochlorothiazide (HYDRODIURIL) 25 MG tablet Take 25 mg by mouth daily.    Marland Kitchen HYDROcodone-acetaminophen (NORCO) 7.5-325 MG per tablet Take 1 tablet by mouth 2 (two) times daily as needed for pain.     Marland Kitchen losartan (COZAAR) 100 MG tablet     . lovastatin (MEVACOR)  20 MG tablet Take 20 mg by mouth at bedtime.    . Multiple Vitamins-Minerals (ONE-A-DAY 50 PLUS) TABS Take 1 tablet by mouth daily.    . Omega-3 Fatty Acids (FISH OIL) 1200 MG CAPS Take 2 capsules by mouth daily.    Marland Kitchen omeprazole (PRILOSEC) 20 MG capsule Take 1 capsule (20 mg total) by mouth daily. 30 capsule 11  . quinapril (ACCUPRIL) 40 MG tablet Take 40 mg by mouth daily. Reported on 10/30/2015    . verapamil (VERELAN PM) 240 MG 24 hr capsule Take 240 mg by mouth daily. Reported on 10/30/2015    . diclofenac (VOLTAREN) 75 MG EC tablet Take 1 tablet (75 mg total) by mouth 2 (two) times daily with a meal. 60 tablet 2   No current facility-administered medications for this visit.     Physical Exam  Blood pressure 147/87, pulse 74, temperature 97.5 F (36.4 C), resp.  rate 16, height 5\' 8"  (1.727 m), weight 216 lb (97.977 kg).  Constitutional: overall normal hygiene, normal nutrition, well developed, normal grooming, normal body habitus. Assistive device:none  Musculoskeletal: gait and station Limp none, muscle tone and strength are normal, no tremors or atrophy is present.  .  Neurological: coordination overall normal.  Deep tendon reflex/nerve stretch intact.  Sensation normal.  Cranial nerves II-XII intact.   Skin:   normal overall no scars, lesions, ulcers or rashes. No psoriasis.  Psychiatric: Alert and oriented x 3.  Recent memory intact, remote memory unclear.  Normal mood and affect. Well groomed.  Good eye contact.  Cardiovascular: overall no swelling, no varicosities, no edema bilaterally, normal temperatures of the legs and arms, no clubbing, cyanosis and good capillary refill.  Lymphatic: palpation is normal.  The right lower extremity is examined:  Inspection:  Thigh:  Non-tender and no defects  Knee has swelling 1+ effusion.                        Joint tenderness is present                        Patient is tender over the medial joint line  Lower Leg:  Has normal appearance and no tenderness or defects  Ankle:  Non-tender and no defects  Foot:  Non-tender and no defects Range of Motion:  Knee:  Range of motion is: 0-105                        Crepitus is  present  Ankle:  Range of motion is normal. Strength and Tone:  The right lower extremity has normal strength and tone. Stability:  Knee:  The knee is stable.  Ankle:  The ankle is stable.   Extremities:Both shoulder have slight tenderness, no effusion, NV intact. Inspection bilateral normal both shoulders Strength and tone normal both upper extremities Range of motion full of both shoulders but tender in the extremes.   The patient has been educated about the nature of the problem(s) and counseled on treatment options.  The patient appeared to understand what I have  discussed and is in agreement with it.  Encounter Diagnoses  Name Primary?  . Right knee pain Yes  . Bilateral shoulder pain     PLAN Call if any problems.  Precautions discussed.  Continue current medications.   Return to clinic 3 months

## 2016-01-29 ENCOUNTER — Encounter: Payer: Self-pay | Admitting: Gastroenterology

## 2016-01-29 ENCOUNTER — Other Ambulatory Visit: Payer: Self-pay

## 2016-01-29 ENCOUNTER — Ambulatory Visit (INDEPENDENT_AMBULATORY_CARE_PROVIDER_SITE_OTHER): Payer: BLUE CROSS/BLUE SHIELD | Admitting: Gastroenterology

## 2016-01-29 VITALS — BP 148/87 | HR 66 | Temp 99.1°F | Ht 69.0 in | Wt 214.2 lb

## 2016-01-29 DIAGNOSIS — R1010 Upper abdominal pain, unspecified: Secondary | ICD-10-CM | POA: Diagnosis not present

## 2016-01-29 DIAGNOSIS — R1011 Right upper quadrant pain: Secondary | ICD-10-CM

## 2016-01-29 DIAGNOSIS — R1013 Epigastric pain: Secondary | ICD-10-CM

## 2016-01-29 DIAGNOSIS — R1012 Left upper quadrant pain: Principal | ICD-10-CM

## 2016-01-29 NOTE — Progress Notes (Signed)
CC'ED TO PCP 

## 2016-01-29 NOTE — Assessment & Plan Note (Signed)
SYMPTOMS NOT IDEALLY CONTROLLED AFTER BID OMEPRAZOLE. LAST EGD AUG 2015. DIFFERENTIAL DIAGNOSIS INCLUDES: H PYLORI GASTRITIS, UNCONTROLLED GERD, LESS LIKELY GE JUNCTION TUMOR, GASTRIC OR PANCREATIC CA, SMALL INTESTINE BACTERIAL OVERGROWTH, OR CHRONIC MESENTERIC ISCHEMIA.  AVOID ITEMS THAT CAUSE BLOATING & GAS.  HANDOUT GIVEN. CONTINUE OMEPRAZOLE.  TAKE 30 MINUTES PRIOR TO YOUR MEALS TWICE DAILY. UPPER ENDOSCOPY Friday MAY 5 TO BIOPSY GASTRIC MUCOSA. DISCUSSED PROCEDURE, BENEFITS, & RISKS: < 1% chance of medication reaction, perforation, OR bleeding. PLAN FOR CT -ABD->HBT IF NO H PYLORI. FOLLOW UP IN 4 MOS.

## 2016-01-29 NOTE — Patient Instructions (Signed)
AVOID ITEMS THAT CAUSE BLOATING & GAS. SEE INFO BELOW.  CONTINUE OMEPRAZOLE.  TAKE 30 MINUTES PRIOR TO YOUR MEALS TWICE DAILY.  UPPER ENDOSCOPY Friday MAY 5.  FOLLOW UP IN 4 MOS.   BLOATING AND GAS PREVENTION  Although gas may be uncomfortable and embarrassing, it is not life-threatening. Understanding causes, ways to reduce symptoms, and treatment will help most people find some relief. Points to remember . Everyone has gas in the digestive tract. Marland Kitchen People often believe normal passage of gas to be excessive. . Gas comes from two main sources: swallowed air and normal breakdown of certain foods by harmless bacteria naturally present in the large intestine. . Many foods with carbohydrates can cause gas. Fats and proteins cause little gas. . Foods that may cause gas include o beans  o vegetables, such as broccoli, cabbage, brussels sprouts, onions, artichokes, and asparagus  o fruits, such as pears, apples, and peaches  o whole grains, such as whole wheat and bran  o soft drinks and fruit drinks  o milk and milk products, such as cheese and ice cream, and packaged foods prepared with lactose, such as bread, cereal, and salad dressing  o foods containing sorbitol, such as dietetic foods and sugar free candies and gums . The most common symptoms of gas are belching, flatulence, bloating, and abdominal pain. However, some of these symptoms are often caused by an intestinal disorder, such as irritable bowel syndrome, rather than too much gas. . The most common ways to reduce the discomfort of gas are changing diet, taking nonprescription medicines, and reducing the amount of air swallowed. . Digestive enzymes, such as lactase supplements, actually help digest carbohydrates and may allow people to eat foods that normally cause gas.

## 2016-01-29 NOTE — Progress Notes (Signed)
ON RECALL  °

## 2016-01-29 NOTE — Progress Notes (Signed)
Subjective:    Patient ID: Jamie Hunt, female    DOB: 1950/11/15, 65 y.o.   MRN: [id number]  [name],[name], MD   HPI STILL HAVING LUQ PAIN(SHARP, DULL AND MAY BE BOTH SIDES), BETTER BUT NOT RESOLVED AFTER BID OMEPRAZOLE. FOOD MAKES IT HURT AND SOMETIMES NOT, FEELS GAS IN UPPER ABDOMEN. INTENTIONAL 4 LBS WEIGHT LOSS. PAIN BETTER WITH TIME AND IF SHE WATCHES WHAT SHE EATS. FEEL LIKE PRESSURE SOMETIMES AND LASTS FROM MINS TO LESS THAN AN HOUR. OTHER TRIGGERS: STRESS(JOB, FAMILY-GRANDDAUGHTER), SODA. DOES WORK AT [employer] FOR PAST 12 YEARS. APPETITE: GOOD, BUT DEPENDS ON WHAT SHE EATS. BMs: DAILY AT LEAST ONCE A DAY AND MAY MOVE BID WITH PRILOSEC. HEARTBURN:1-2X/WEEK.RARE WATERY STOOLS. PT DENIES FEVER, CHILLS, HEMATOCHEZIA, HEMATEMESIS, nausea, vomiting, melena, CHEST PAIN, SHORTNESS OF BREATH,  CHANGE IN BOWEL IN HABITS, constipation, problems swallowing, problems with sedation, OR heartburn.   Past Medical History  Diagnosis Date  . Hypertension   . Arthritis   . High cholesterol   . Shingles   . Acid reflux   . PONV (postoperative nausea and vomiting)    Past Surgical History  Procedure Laterality Date  . Abdominal hysterectomy    . Back surgery    . Cholecystectomy    . Vascular surgery      varicose veins  . Colonoscopy  05/08/2009    SLF:A few scattered diverticula throughout the entire colon.  Small  internal hemorrhoids.  Otherwise, no polyps, masses, inflammatory changes, or AVMs seen.  Normal retroflexed view of the rectum  . Colonoscopy N/A 06/03/2014    SLF:  1. six colon polyps removed. 2. Moderate sized internal hemorrhoids. 3. The left colon is redundant 4.  Moderate diverticulosis throughout the entire examed colon. next TCS in10 years with overtube.  . Esophagogastroduodenoscopy N/A 06/03/2014    SLF:  1. small hiatal hernia    Allergies  Allergen Reactions  . Codeine Nausea And Vomiting  . Penicillins     Yeast infection     Current Outpatient Prescriptions  Medication Sig Dispense Refill  . Ascorbic Acid (VITAMIN C PO) Take 1 tablet by mouth daily.    Marland Kitchen aspirin EC 81 MG tablet Take 81 mg by mouth daily.    Marland Kitchen CALCIUM PO Take 1 tablet by mouth daily.    . Cyanocobalamin (VITAMIN B-12 PO) Take 1 tablet by mouth daily.    . VOLTAREN 75 MG EC tablet Take 1 tablet (75 mg total) by mouth 2 (two) times daily with a meal.    . famciclovir (FAMVIR) 250 MG tablet Take 250 mg by mouth 2 (two) times daily.    . ferrous sulfate (IRON SUPPLEMENT) 325 (65 FE) MG tablet Take 325 mg by mouth daily with breakfast.    . fexofenadine (ALLEGRA) 180 MG tablet Take 180 mg by mouth daily.    . hydrALAZINE (APRESOLINE) 25 MG tablet Take 25 mg by mouth 2 (two) times daily.    . hydrochlorothiazide (HYDRODIURIL) 25 MG tablet Take 25 mg by mouth daily.    Marland Kitchen HYDROcodone-acetaminophen (NORCO) 7.5-325 MG per tablet Take 1 tablet by mouth 2 (two) times daily as needed for pain.     Marland Kitchen losartan (COZAAR) 100 MG tablet     . lovastatin (MEVACOR) 20 MG tablet Take 20 mg by mouth at bedtime.    . Multiple Vitamins-Minerals (ONE-A-DAY 50 PLUS) TABS Take 1 tablet by mouth daily.    . Omega-3 Fatty Acids (FISH OIL) 1200 MG CAPS Take  2 capsules by mouth daily.    Marland Kitchen omeprazole (PRILOSEC) 20 MG capsule Take 1 capsule (20 mg total) by mouth daily.    . verapamil (VERELAN PM) 240 MG 24 hr capsule Take 240 mg by mouth daily. Reported on 10/30/2015    . quinapril (ACCUPRIL) 40 MG tablet Take 40 mg by mouth daily. Reported on 01/29/2016     Review of Systems PER HPI OTHERWISE ALL SYSTEMS ARE NEGATIVE.    Objective:   Physical Exam  Constitutional: She is oriented to person, place, and time. She appears well-developed and well-nourished. No distress.  HENT:  Head: Normocephalic and atraumatic.  Mouth/Throat: Oropharynx is clear and moist. No oropharyngeal exudate.  Eyes: Pupils are equal, round, and reactive to light. No scleral icterus.  Neck: Normal  range of motion. Neck supple.  Cardiovascular: Normal rate, regular rhythm and normal heart sounds.   Pulmonary/Chest: Effort normal and breath sounds normal. No respiratory distress.  Abdominal: Soft. Bowel sounds are normal. She exhibits no distension. There is no tenderness.  Musculoskeletal: She exhibits no edema.  Lymphadenopathy:    She has no cervical adenopathy.  Neurological: She is alert and oriented to person, place, and time.  NO FOCAL DEFICITS  Psychiatric: She has a normal mood and affect.  Vitals reviewed.     Assessment & Plan:

## 2016-02-16 ENCOUNTER — Encounter (HOSPITAL_COMMUNITY)
Admission: RE | Disposition: A | Discharge status: Home / Self Care | Payer: Self-pay | Source: Ambulatory Visit | Attending: Gastroenterology

## 2016-02-16 ENCOUNTER — Ambulatory Visit (HOSPITAL_COMMUNITY)
Admission: RE | Admit: 2016-02-16 | Discharge: 2016-02-16 | Disposition: A | Discharge status: Home / Self Care | Payer: BLUE CROSS/BLUE SHIELD | Source: Ambulatory Visit | Attending: Gastroenterology | Admitting: Gastroenterology

## 2016-02-16 ENCOUNTER — Encounter (HOSPITAL_COMMUNITY): Payer: Self-pay | Admitting: *Deleted

## 2016-02-16 DIAGNOSIS — M1991 Primary osteoarthritis, unspecified site: Secondary | ICD-10-CM | POA: Diagnosis not present

## 2016-02-16 DIAGNOSIS — K2970 Gastritis, unspecified, without bleeding: Secondary | ICD-10-CM | POA: Diagnosis not present

## 2016-02-16 DIAGNOSIS — Z791 Long term (current) use of non-steroidal anti-inflammatories (NSAID): Secondary | ICD-10-CM | POA: Diagnosis not present

## 2016-02-16 DIAGNOSIS — R1013 Epigastric pain: Secondary | ICD-10-CM

## 2016-02-16 DIAGNOSIS — I10 Essential (primary) hypertension: Secondary | ICD-10-CM | POA: Diagnosis not present

## 2016-02-16 DIAGNOSIS — Z7982 Long term (current) use of aspirin: Secondary | ICD-10-CM | POA: Diagnosis not present

## 2016-02-16 DIAGNOSIS — E7800 Pure hypercholesterolemia, unspecified: Secondary | ICD-10-CM | POA: Insufficient documentation

## 2016-02-16 DIAGNOSIS — K2950 Unspecified chronic gastritis without bleeding: Secondary | ICD-10-CM | POA: Diagnosis not present

## 2016-02-16 DIAGNOSIS — K222 Esophageal obstruction: Secondary | ICD-10-CM | POA: Insufficient documentation

## 2016-02-16 DIAGNOSIS — K219 Gastro-esophageal reflux disease without esophagitis: Secondary | ICD-10-CM | POA: Insufficient documentation

## 2016-02-16 DIAGNOSIS — Z79899 Other long term (current) drug therapy: Secondary | ICD-10-CM | POA: Diagnosis not present

## 2016-02-16 HISTORY — PX: ESOPHAGOGASTRODUODENOSCOPY: SHX5428

## 2016-02-16 LAB — CBC
HEMATOCRIT: 31 % — AB (ref 36.0–46.0)
Hemoglobin: 11 g/dL — ABNORMAL LOW (ref 12.0–15.0)
MCH: 34.3 pg — ABNORMAL HIGH (ref 26.0–34.0)
MCHC: 35.5 g/dL (ref 30.0–36.0)
MCV: 96.6 fL (ref 78.0–100.0)
PLATELETS: 206 10*3/uL (ref 150–400)
RBC: 3.21 MIL/uL — ABNORMAL LOW (ref 3.87–5.11)
RDW: 12.5 % (ref 11.5–15.5)
WBC: 5 10*3/uL (ref 4.0–10.5)

## 2016-02-16 SURGERY — EGD (ESOPHAGOGASTRODUODENOSCOPY)
Anesthesia: Moderate Sedation

## 2016-02-16 MED ORDER — LIDOCAINE VISCOUS 2 % MT SOLN
OROMUCOSAL | Status: DC | PRN
  Administered 2016-02-16: 3 mL via OROMUCOSAL

## 2016-02-16 MED ORDER — SODIUM CHLORIDE 0.9 % IV SOLN
INTRAVENOUS | Status: DC
  Administered 2016-02-16: 14:00:00 via INTRAVENOUS

## 2016-02-16 MED ORDER — MIDAZOLAM HCL 5 MG/5ML IJ SOLN
INTRAMUSCULAR | Status: AC
  Filled 2016-02-16: qty 10

## 2016-02-16 MED ORDER — ONDANSETRON HCL 4 MG/2ML IJ SOLN
INTRAMUSCULAR | Status: DC | PRN
  Administered 2016-02-16: 4 mg via INTRAVENOUS

## 2016-02-16 MED ORDER — MEPERIDINE HCL 100 MG/ML IJ SOLN
INTRAMUSCULAR | Status: AC
  Filled 2016-02-16: qty 2

## 2016-02-16 MED ORDER — ONDANSETRON HCL 4 MG/2ML IJ SOLN
INTRAMUSCULAR | Status: AC
  Filled 2016-02-16: qty 2

## 2016-02-16 MED ORDER — LIDOCAINE VISCOUS 2 % MT SOLN
OROMUCOSAL | Status: AC
  Filled 2016-02-16: qty 15

## 2016-02-16 MED ORDER — STERILE WATER FOR IRRIGATION IR SOLN
Status: DC | PRN
  Administered 2016-02-16: 14:00:00

## 2016-02-16 MED ORDER — MIDAZOLAM HCL 5 MG/5ML IJ SOLN
INTRAMUSCULAR | Status: DC | PRN
  Administered 2016-02-16 (×3): 2 mg via INTRAVENOUS

## 2016-02-16 MED ORDER — MEPERIDINE HCL 100 MG/ML IJ SOLN
INTRAMUSCULAR | Status: DC | PRN
  Administered 2016-02-16: 50 mg via INTRAVENOUS
  Administered 2016-02-16 (×2): 25 mg via INTRAVENOUS

## 2016-02-16 NOTE — Discharge Instructions (Addendum)
You have gastritis DUE TO YOUR USING ASPIRIN, AND VOLTAREN AND A SMALL HIATAL HERNIA. I biopsied your stomach.   STOP TAKING IRON PILLS. I CHECKED YOUR BLOOD COUNT TODAY.  FOLLOW A LOW FAT DIET. MEATS SHOULD BE BAKED, BROILED, OR BOILED. AVOID FRIED FOODS. SEE INFO BELOW.  CONTINUE TO AVOID FOODS THAT CAUSE BLOATING AND GAS.  CONTINUE OMEPRAZOLE.  TAKE 30 MINUTES PRIOR TO YOUR FIRST MEAL.  YOUR BIOPSY RESULTS WILL BE AVAILABLE IN MY CHART MAY 11 AND MY OFFICE WILL CONTACT YOU IN 10-14 DAYS WITH YOUR RESULTS.   FOLLOW UP IN AUG 2017.  UPPER ENDOSCOPY AFTER CARE Read the instructions outlined below and refer to this sheet in the next week. These discharge instructions provide you with general information on caring for yourself after you leave the hospital. While your treatment has been planned according to the most current medical practices available, unavoidable complications occasionally occur. If you have any problems or questions after discharge, call DR. Zian Delair, [phone].  ACTIVITY  You may resume your regular activity, but move at a slower pace for the next 24 hours.   Take frequent rest periods for the next 24 hours.   Walking will help get rid of the air and reduce the bloated feeling in your belly (abdomen).   No driving for 24 hours (because of the medicine (anesthesia) used during the test).   You may shower.   Do not sign any important legal documents or operate any machinery for 24 hours (because of the anesthesia used during the test).    NUTRITION  Drink plenty of fluids.   You may resume your normal diet as instructed by your doctor.   Begin with a light meal and progress to your normal diet. Heavy or fried foods are harder to digest and may make you feel sick to your stomach (nauseated).   Avoid alcoholic beverages for 24 hours or as instructed.    MEDICATIONS  You may resume your normal medications.   WHAT YOU CAN EXPECT TODAY  Some feelings of  bloating in the abdomen.   Passage of more gas than usual.    IF YOU HAD A BIOPSY TAKEN DURING THE UPPER ENDOSCOPY:  Eat a soft diet IF YOU HAVE NAUSEA, BLOATING, ABDOMINAL PAIN, OR VOMITING.    FINDING OUT THE RESULTS OF YOUR TEST Not all test results are available during your visit. DR. Oneida Alar WILL CALL YOU WITHIN 14 DAYS OF YOUR PROCEDUE WITH YOUR RESULTS. Do not assume everything is normal if you have not heard from DR. Trystin Terhune, CALL HER OFFICE AT [phone].  SEEK IMMEDIATE MEDICAL ATTENTION AND CALL THE OFFICE: [phone] IF:  You have more than a spotting of blood in your stool.   Your belly is swollen (abdominal distention).   You are nauseated or vomiting.   You have a temperature over 101F.   You have abdominal pain or discomfort that is severe or gets worse throughout the day.   Gastritis  Gastritis is an inflammation (the body's way of reacting to injury and/or infection) of the stomach. It is often caused by bacterial (germ) infections. It can also be caused BY ASPIRIN, BC/GOODY POWDER'S, (IBUPROFEN) MOTRIN, OR ALEVE (NAPROXEN), chemicals (including alcohol), SPICY FOODS, and medications. This illness may be associated with generalized malaise (feeling tired, not well), UPPER ABDOMINAL STOMACH cramps, and fever. One common bacterial cause of gastritis is an organism known as H. Pylori. This can be treated with antibiotics.    Low-Fat Diet BREADS, CEREALS, PASTA,  RICE, DRIED PEAS, AND BEANS These products are high in carbohydrates and most are low in fat. Therefore, they can be increased in the diet as substitutes for fatty foods. They too, however, contain calories and should not be eaten in excess. Cereals can be eaten for snacks as well as for breakfast.  Include foods that contain fiber (fruits, vegetables, whole grains, and legumes). Research shows that fiber may lower blood cholesterol levels, especially the water-soluble fiber found in fruits, vegetables, oat  products, and legumes. FRUITS AND VEGETABLES It is good to eat fruits and vegetables. Besides being sources of fiber, both are rich in vitamins and some minerals. They help you get the daily allowances of these nutrients. Fruits and vegetables can be used for snacks and desserts. MEATS Limit lean meat, chicken, [place], and fish to no more than 6 ounces per day. Beef, Pork, and Lamb Use lean cuts of beef, pork, and lamb. Lean cuts include:  Extra-lean ground beef.  Arm roast.  Sirloin tip.  Center-cut ham.  Round steak.  Loin chops.  Rump roast.  Tenderloin.  Trim all fat off the outside of meats before cooking. It is not necessary to severely decrease the intake of red meat, but lean choices should be made. Lean meat is rich in protein and contains a highly absorbable form of iron. Premenopausal women, in particular, should avoid reducing lean red meat because this could increase the risk for low red blood cells (iron-deficiency anemia).  Chicken and [place] These are good sources of protein. The fat of poultry can be reduced by removing the skin and underlying fat layers before cooking. Chicken and [place] can be substituted for lean red meat in the diet. Poultry should not be fried or covered with high-fat sauces. Fish and Shellfish Fish is a good source of protein. Shellfish contain cholesterol, but they usually are low in saturated fatty acids. The preparation of fish is important. Like chicken and [place], they should not be fried or covered with high-fat sauces. EGGS Egg whites contain no fat or cholesterol. They can be eaten often. Try 1 to 2 egg whites instead of whole eggs in recipes or use egg substitutes that do not contain yolk.  MILK AND DAIRY PRODUCTS Use skim or 1% milk instead of 2% or whole milk. Decrease whole milk, natural, and processed cheeses. Use nonfat or low-fat (2%) cottage cheese or low-fat cheeses made from vegetable oils. Choose nonfat or low-fat (1 to 2%) yogurt.  Experiment with evaporated skim milk in recipes that call for heavy cream. Substitute low-fat yogurt or low-fat cottage cheese for sour cream in dips and salad dressings. Have at least 2 servings of low-fat dairy products, such as 2 glasses of skim (or 1%) milk each day to help get your daily calcium intake.  FATS AND OILS Butterfat, lard, and beef fats are high in saturated fat and cholesterol. These should be avoided.Vegetable fats do not contain cholesterol. AVOID coconut oil, palm oil, and palm kernel oil, WHICH are very high in saturated fats. These should be limited. These fats are often used in bakery goods, processed foods, popcorn, oils, and nondairy creamers. Vegetable shortenings and some peanut butters contain hydrogenated oils, which are also saturated fats. Read the labels on these foods and check for saturated vegetable oils.  Desirable liquid vegetable oils are corn oil, cottonseed oil, olive oil, canola oil, safflower oil, soybean oil, and sunflower oil. Peanut oil is not as good, but small amounts are acceptable. Buy a heart-healthy tub  margarine that has no partially hydrogenated oils in the ingredients. AVOID Mayonnaise and salad dressings often are made from unsaturated fats.  OTHER EATING TIPS Snacks  Most sweets should be limited as snacks. They tend to be rich in calories and fats, and their caloric content outweighs their nutritional value. Some good choices in snacks are graham crackers, melba toast, soda crackers, bagels (no egg), English muffins, fruits, and vegetables. These snacks are preferable to snack crackers, [place] fries, and chips. Popcorn should be air-popped or cooked in small amounts of liquid vegetable oil.  Desserts Eat fruit, low-fat yogurt, and fruit ices instead of pastries, cake, and cookies. Sherbet, angel food cake, gelatin dessert, frozen low-fat yogurt, or other frozen products that do not contain saturated fat (pure fruit juice bars, frozen ice pops) are  also acceptable.   COOKING METHODS Choose those methods that use little or no fat. They include: Poaching.  Braising.  Steaming.  Grilling.  Baking.  Stir-frying.  Broiling.  Microwaving.  Foods can be cooked in a nonstick pan without added fat, or use a nonfat cooking spray in regular cookware. Limit fried foods and avoid frying in saturated fat. Add moisture to lean meats by using water, broth, cooking wines, and other nonfat or low-fat sauces along with the cooking methods mentioned above. Soups and stews should be chilled after cooking. The fat that forms on top after a few hours in the refrigerator should be skimmed off. When preparing meals, avoid using excess salt. Salt can contribute to raising blood pressure in some people.  EATING AWAY FROM HOME Order entres, potatoes, and vegetables without sauces or butter. When meat exceeds the size of a deck of cards (3 to 4 ounces), the rest can be taken home for another meal. Choose vegetable or fruit salads and ask for low-calorie salad dressings to be served on the side. Use dressings sparingly. Limit high-fat toppings, such as bacon, crumbled eggs, cheese, sunflower seeds, and olives. Ask for heart-healthy tub margarine instead of butter.

## 2016-02-16 NOTE — H&P (View-Only) (Signed)
Subjective:    Patient ID: Jamie Hunt, female    DOB: 1950/11/15, 65 y.o.   MRN: [id number]  [name],[name], MD   HPI STILL HAVING LUQ PAIN(SHARP, DULL AND MAY BE BOTH SIDES), BETTER BUT NOT RESOLVED AFTER BID OMEPRAZOLE. FOOD MAKES IT HURT AND SOMETIMES NOT, FEELS GAS IN UPPER ABDOMEN. INTENTIONAL 4 LBS WEIGHT LOSS. PAIN BETTER WITH TIME AND IF SHE WATCHES WHAT SHE EATS. FEEL LIKE PRESSURE SOMETIMES AND LASTS FROM MINS TO LESS THAN AN HOUR. OTHER TRIGGERS: STRESS(JOB, FAMILY-GRANDDAUGHTER), SODA. DOES WORK AT [employer] FOR PAST 12 YEARS. APPETITE: GOOD, BUT DEPENDS ON WHAT SHE EATS. BMs: DAILY AT LEAST ONCE A DAY AND MAY MOVE BID WITH PRILOSEC. HEARTBURN:1-2X/WEEK.RARE WATERY STOOLS. PT DENIES FEVER, CHILLS, HEMATOCHEZIA, HEMATEMESIS, nausea, vomiting, melena, CHEST PAIN, SHORTNESS OF BREATH,  CHANGE IN BOWEL IN HABITS, constipation, problems swallowing, problems with sedation, OR heartburn.   Past Medical History  Diagnosis Date  . Hypertension   . Arthritis   . High cholesterol   . Shingles   . Acid reflux   . PONV (postoperative nausea and vomiting)    Past Surgical History  Procedure Laterality Date  . Abdominal hysterectomy    . Back surgery    . Cholecystectomy    . Vascular surgery      varicose veins  . Colonoscopy  05/08/2009    SLF:A few scattered diverticula throughout the entire colon.  Small  internal hemorrhoids.  Otherwise, no polyps, masses, inflammatory changes, or AVMs seen.  Normal retroflexed view of the rectum  . Colonoscopy N/A 06/03/2014    SLF:  1. six colon polyps removed. 2. Moderate sized internal hemorrhoids. 3. The left colon is redundant 4.  Moderate diverticulosis throughout the entire examed colon. next TCS in10 years with overtube.  . Esophagogastroduodenoscopy N/A 06/03/2014    SLF:  1. small hiatal hernia    Allergies  Allergen Reactions  . Codeine Nausea And Vomiting  . Penicillins     Yeast infection     Current Outpatient Prescriptions  Medication Sig Dispense Refill  . Ascorbic Acid (VITAMIN C PO) Take 1 tablet by mouth daily.    Marland Kitchen aspirin EC 81 MG tablet Take 81 mg by mouth daily.    Marland Kitchen CALCIUM PO Take 1 tablet by mouth daily.    . Cyanocobalamin (VITAMIN B-12 PO) Take 1 tablet by mouth daily.    . VOLTAREN 75 MG EC tablet Take 1 tablet (75 mg total) by mouth 2 (two) times daily with a meal.    . famciclovir (FAMVIR) 250 MG tablet Take 250 mg by mouth 2 (two) times daily.    . ferrous sulfate (IRON SUPPLEMENT) 325 (65 FE) MG tablet Take 325 mg by mouth daily with breakfast.    . fexofenadine (ALLEGRA) 180 MG tablet Take 180 mg by mouth daily.    . hydrALAZINE (APRESOLINE) 25 MG tablet Take 25 mg by mouth 2 (two) times daily.    . hydrochlorothiazide (HYDRODIURIL) 25 MG tablet Take 25 mg by mouth daily.    Marland Kitchen HYDROcodone-acetaminophen (NORCO) 7.5-325 MG per tablet Take 1 tablet by mouth 2 (two) times daily as needed for pain.     Marland Kitchen losartan (COZAAR) 100 MG tablet     . lovastatin (MEVACOR) 20 MG tablet Take 20 mg by mouth at bedtime.    . Multiple Vitamins-Minerals (ONE-A-DAY 50 PLUS) TABS Take 1 tablet by mouth daily.    . Omega-3 Fatty Acids (FISH OIL) 1200 MG CAPS Take  2 capsules by mouth daily.    Marland Kitchen omeprazole (PRILOSEC) 20 MG capsule Take 1 capsule (20 mg total) by mouth daily.    . verapamil (VERELAN PM) 240 MG 24 hr capsule Take 240 mg by mouth daily. Reported on 10/30/2015    . quinapril (ACCUPRIL) 40 MG tablet Take 40 mg by mouth daily. Reported on 01/29/2016     Review of Systems PER HPI OTHERWISE ALL SYSTEMS ARE NEGATIVE.    Objective:   Physical Exam  Constitutional: She is oriented to person, place, and time. She appears well-developed and well-nourished. No distress.  HENT:  Head: Normocephalic and atraumatic.  Mouth/Throat: Oropharynx is clear and moist. No oropharyngeal exudate.  Eyes: Pupils are equal, round, and reactive to light. No scleral icterus.  Neck: Normal  range of motion. Neck supple.  Cardiovascular: Normal rate, regular rhythm and normal heart sounds.   Pulmonary/Chest: Effort normal and breath sounds normal. No respiratory distress.  Abdominal: Soft. Bowel sounds are normal. She exhibits no distension. There is no tenderness.  Musculoskeletal: She exhibits no edema.  Lymphadenopathy:    She has no cervical adenopathy.  Neurological: She is alert and oriented to person, place, and time.  NO FOCAL DEFICITS  Psychiatric: She has a normal mood and affect.  Vitals reviewed.     Assessment & Plan:

## 2016-02-16 NOTE — Op Note (Signed)
[facility] Patient Name: Jamie Hunt Procedure Date: 02/16/2016 1:58 PM MRN: [id number] Date of Birth: [birth year] Attending MD: Barney Drain , MD CSN: [id number] Age: 65 Admit Type: Outpatient Procedure:                Upper GI endoscopy Indications:              Dyspepsia Providers:                Barney Drain, MD, Gwenlyn Fudge, RN, Isabella Stalling,                            Technician Referring MD:             Rosita Fire Medicines:                Ondansetron 4 mg IV, Meperidine 100 mg IV,                            Midazolam 6 mg IV Complications:            No immediate complications. Estimated Blood Loss:     Estimated blood loss was minimal. Procedure:                Pre-Anesthesia Assessment:                           - Prior to the procedure, a History and Physical                            was performed, and patient medications and                            allergies were reviewed. The patient's tolerance of                            previous anesthesia was also reviewed. The risks                            and benefits of the procedure and the sedation                            options and risks were discussed with the patient.                            All questions were answered, and informed consent                            was obtained. Prior Anticoagulants: The patient has                            taken aspirin, last dose was day of procedure. ASA                            Grade Assessment: II - A patient with mild systemic  disease. After reviewing the risks and benefits,                            the patient was deemed in satisfactory condition to                            undergo the procedure.                           After obtaining informed consent, the endoscope was                            passed under direct vision. Throughout the                            procedure, the patient's blood pressure, pulse, and                             oxygen saturations were monitored continuously. The                            [id number]) scope was introduced through the                            mouth, and advanced to the second part of duodenum.                            The upper GI endoscopy was accomplished without                            difficulty. The patient tolerated the procedure                            well. Scope In: 2:31:25 PM Scope Out: 2:40:35 PM Total Procedure Duration: 0 hours 9 minutes 10 seconds  Findings:      A mild Schatzki ring (acquired) was found at the gastroesophageal       junction.      Scattered mild inflammation characterized by congestion (edema) and       erythema was found in the gastric antrum. Biopsies were taken with a       cold forceps for Helicobacter pylori testing.      The duodenal bulb and second portion of the duodenum were normal. Impression:               - Mild Schatzki ring.                           - Gastritis. Biopsied. Moderate Sedation:      Moderate (conscious) sedation was administered by the endoscopy nurse       and supervised by the endoscopist. The following parameters were       monitored: oxygen saturation, heart rate, blood pressure, respiratory       rate, EKG, adequacy of pulmonary ventilation, and response to care.       Total physician intraservice time was 29 minutes. Recommendation:           -  Resume previous diet.                           - Continue present medications.                           - Await pathology results.                           STOP TAKING IRON PILLS. CHECK CBC TODAY.                           FOLLOW A LOW FAT DIET. MEATS SHOULD BE BAKED,                            BROILED, OR BOILED. AVOID FRIED FOODS.                           CONTINUE TO AVOID FOODS THAT CAUSE BLOATING AND GAS.                           CONTINUE OMEPRAZOLE. TAKE 30 MINUTES PRIOR TO YOUR                            FIRST  MEAL.                           FOLLOW UP IN AUG 2017.                           - Patient has a contact number available for                            emergencies. The signs and symptoms of potential                            delayed complications were discussed with the                            patient. Return to normal activities tomorrow.                            Written discharge instructions were provided to the                            patient. Procedure Code(s):        --- Professional ---                           [phone], Esophagogastroduodenoscopy, flexible,                            transoral; with biopsy, single or multiple                           99153, Moderate sedation services; each additional  15 minutes intraservice time                           G0500, Moderate sedation services provided by the                            same physician or other qualified health care                            professional performing a gastrointestinal                            endoscopic service that sedation supports,                            requiring the presence of an independent trained                            observer to assist in the monitoring of the                            patient's level of consciousness and physiological                            status; initial 15 minutes of intra-service time;                            patient age 82 years or older (additional time may                            be reported with [phone], as appropriate) Diagnosis Code(s):        --- Professional ---                           K22.2, Esophageal obstruction                           K29.70, Gastritis, unspecified, without bleeding                           R10.13, Epigastric pain CPT copyright 2016 American Medical Association. All rights reserved. The codes documented in this report are preliminary and upon coder review may  be revised to meet  current compliance requirements. Barney Drain, MD Barney Drain, MD 02/16/2016 8:54:43 PM This report has been signed electronically. Number of Addenda: 0

## 2016-02-16 NOTE — Interval H&P Note (Signed)
History and Physical Interval Note:  02/16/2016 2:10 PM  Jamie Hunt  has presented today for surgery, with the diagnosis of dyspepsia  The various methods of treatment have been discussed with the patient and family. After consideration of risks, benefits and other options for treatment, the patient has consented to  Procedure(s) with comments: ESOPHAGOGASTRODUODENOSCOPY (EGD) (N/A) - 1415 - moved to 5/8 @ 2:30 - office notified pt as a surgical intervention .  The patient's history has been reviewed, patient examined, no change in status, stable for surgery.  I have reviewed the patient's chart and labs.  Questions were answered to the patient's satisfaction.     [employer]

## 2016-02-23 ENCOUNTER — Encounter (HOSPITAL_COMMUNITY): Payer: Self-pay | Admitting: Gastroenterology

## 2016-03-01 ENCOUNTER — Telehealth: Payer: Self-pay

## 2016-03-01 DIAGNOSIS — D649 Anemia, unspecified: Secondary | ICD-10-CM

## 2016-03-01 NOTE — Telephone Encounter (Signed)
Pt called office wanting to know the results of her EGD. Please advise

## 2016-03-03 DIAGNOSIS — D649 Anemia, unspecified: Secondary | ICD-10-CM | POA: Insufficient documentation

## 2016-03-03 NOTE — Telephone Encounter (Signed)
SPOKE WITH PT. EXPLAINED RESULTS AND PLAN OUTLINED BELOW. REVIEWED PLAN. DISCUSSED RISK OF CAPSULE RETENTION/SURGERY. PT VOICED HER UNDERSTANDING. GIVENS IN 7-10 DAYS. MAY CONTINUE ASA/VOLTAREN. HOLD IRON FOR 7 DAYS.

## 2016-03-03 NOTE — Telephone Encounter (Signed)
APPT MADE

## 2016-03-03 NOTE — Telephone Encounter (Signed)
Called patient TO DISCUSS RESULTS. LVM-CALL [phone] TO DISCUSS. HER stomach Bx shows SHE HAS gastritis DUE TO YOUR USING ASPIRIN, AND VOLTAREN.    HER BLOOD COUNT IS LOW AND LIKELY DUE TO USING ASPIRIN AND VOLTAREN. SHE NEEDS A FERRITIN. CONTINUE IRON. COMPLETE CAMERA PILL(GIVENS) STUDY, Dx: OBSCURE GI BLEED/ANEMIA.  FOLLOW A LOW FAT DIET. MEATS SHOULD BE BAKED, BROILED, OR BOILED. AVOID FRIED FOODS.   CONTINUE TO AVOID FOODS THAT CAUSE BLOATING AND GAS.  CONTINUE OMEPRAZOLE.  TAKE 30 MINUTES PRIOR TO YOUR FIRST MEAL.  FOLLOW UP IN AUG 2017 E30 NORMOCYTIC ANEMIA/LUQ ABD PAIN.

## 2016-03-09 ENCOUNTER — Other Ambulatory Visit: Payer: Self-pay

## 2016-03-09 DIAGNOSIS — K922 Gastrointestinal hemorrhage, unspecified: Secondary | ICD-10-CM

## 2016-03-09 DIAGNOSIS — D649 Anemia, unspecified: Secondary | ICD-10-CM

## 2016-03-09 DIAGNOSIS — K625 Hemorrhage of anus and rectum: Secondary | ICD-10-CM

## 2016-03-09 NOTE — Telephone Encounter (Signed)
Pt is set for Givens on 06/09. Pt is to come by office to pick up instructions

## 2016-03-09 NOTE — Telephone Encounter (Signed)
No PA is needed for Givens per [facility]. Ref # is [id number]

## 2016-03-19 ENCOUNTER — Encounter (HOSPITAL_COMMUNITY)
Admission: RE | Disposition: A | Discharge status: Home / Self Care | Payer: Self-pay | Source: Ambulatory Visit | Attending: Gastroenterology

## 2016-03-19 ENCOUNTER — Ambulatory Visit (HOSPITAL_COMMUNITY)
Admission: RE | Admit: 2016-03-19 | Discharge: 2016-03-19 | Disposition: A | Discharge status: Home / Self Care | Payer: BLUE CROSS/BLUE SHIELD | Source: Ambulatory Visit | Attending: Gastroenterology | Admitting: Gastroenterology

## 2016-03-19 DIAGNOSIS — K6389 Other specified diseases of intestine: Secondary | ICD-10-CM | POA: Insufficient documentation

## 2016-03-19 DIAGNOSIS — D649 Anemia, unspecified: Secondary | ICD-10-CM | POA: Diagnosis not present

## 2016-03-19 DIAGNOSIS — K922 Gastrointestinal hemorrhage, unspecified: Secondary | ICD-10-CM | POA: Insufficient documentation

## 2016-03-19 HISTORY — PX: GIVENS CAPSULE STUDY: SHX5432

## 2016-03-19 SURGERY — IMAGING PROCEDURE, GI TRACT, INTRALUMINAL, VIA CAPSULE

## 2016-03-23 ENCOUNTER — Encounter (HOSPITAL_COMMUNITY): Payer: Self-pay | Admitting: Gastroenterology

## 2016-04-05 ENCOUNTER — Telehealth: Payer: Self-pay | Admitting: Gastroenterology

## 2016-04-05 NOTE — Procedures (Signed)
PRE-OPERATIVE DIAGNOSIS:  ANEMIA  POST-OPERATIVE DIAGNOSIS:  NSAID ENTEROPATHY(ASA/VOLTAREN)  PROCEDURE:  Procedure(s): GIVENS CAPSULE STUDY  SURGEON:  Surgeon(s): Dorothyann Peng, MD  PATIENT DATA: GASTRIC PASSAGE TIME: 25 m, SB PASSAGE TIME: [place] 35m  RESULTS: LIMITED views of gastric mucosa due to retained contents. No blood in the stomach. FEW EROSIONS SEEN 56:26(10% SB TIME) AND OCCASIONALLY SEEN UP UNTIL 3:43:41(66 % SB TIME).  NO FRESH OR OLD BLOOD SEEN. No masses or AVMs. LIMITED VIEWS OF THE COLON DUE TO RETAINED CONTENTS.  DIAGNOSIS: NSAID ENTEROPATHY  Plan: 1. CHECK FERRITIN WITHIN 7 DAYS. 2. ASSESS BENEFITS V. RISKS OF ASA/VOLTAREN. 3. CBC/FERRITIN 1 WEEK PRIOR TO OPV IN 4 MOS.

## 2016-04-05 NOTE — Telephone Encounter (Signed)
Pt was calling to see if her procedure results were available, Please call [phone]

## 2016-04-08 ENCOUNTER — Other Ambulatory Visit: Payer: Self-pay

## 2016-04-08 DIAGNOSIS — D649 Anemia, unspecified: Secondary | ICD-10-CM

## 2016-04-08 NOTE — Telephone Encounter (Signed)
PLEASE CALL PT. HER GIVENS STUDY SHOWS IRRITATION IN HER SMALL BOWEL DUE TO ASA AND VOLTAREN. SHE NEEDS FERRITIN WITHIN THE NEXT 7-10 DAYS. OPV IN 4 MOS WITH REPEAT CBC/FERRITIN ONE WEEK PRIOR TO HER OPV.

## 2016-04-08 NOTE — Telephone Encounter (Signed)
Pt is aware to go to the lab in the next 7-10 days.

## 2016-04-08 NOTE — Telephone Encounter (Signed)
Ov made °

## 2016-04-12 ENCOUNTER — Other Ambulatory Visit: Payer: Self-pay

## 2016-04-12 DIAGNOSIS — D649 Anemia, unspecified: Secondary | ICD-10-CM

## 2016-04-12 LAB — FERRITIN: Ferritin: 129 ng/mL (ref 20–288)

## 2016-04-14 ENCOUNTER — Ambulatory Visit (INDEPENDENT_AMBULATORY_CARE_PROVIDER_SITE_OTHER): Payer: BLUE CROSS/BLUE SHIELD | Admitting: Orthopaedic Surgery

## 2016-04-14 ENCOUNTER — Encounter: Payer: Self-pay | Admitting: Orthopaedic Surgery

## 2016-04-14 ENCOUNTER — Ambulatory Visit (INDEPENDENT_AMBULATORY_CARE_PROVIDER_SITE_OTHER): Payer: BLUE CROSS/BLUE SHIELD

## 2016-04-14 VITALS — BP 142/81 | HR 74 | Temp 97.7°F | Ht 69.0 in | Wt 205.4 lb

## 2016-04-14 DIAGNOSIS — S92301A Fracture of unspecified metatarsal bone(s), right foot, initial encounter for closed fracture: Secondary | ICD-10-CM

## 2016-04-14 DIAGNOSIS — M25561 Pain in right knee: Secondary | ICD-10-CM

## 2016-04-14 DIAGNOSIS — M79671 Pain in right foot: Secondary | ICD-10-CM

## 2016-04-14 NOTE — Progress Notes (Signed)
Patient Jamie Hunt:[id number] Jamie Hunt, female DOB:[birth year], 65 y.o. [id number]  Chief Complaint  Patient presents with  . Follow-up    right knee and right foot pain    HPI  Jamie Hunt is a 65 y.o. female who has right knee pain chronically.  She is doing well with the right knee but the diclofenac she takes regularly is bothering her stomach. I told her to stop.  I have given samples of Aleve to take.  She has no giving way, no locking.  She has pain of the right foot for about two weeks getting worse.  She has swelling but no trauma, no redness.  It is not getting better.   HPI  Body mass index is 30.32 kg/(m^2).  ROS  Review of Systems  HENT: Negative for congestion.   Respiratory: Negative for cough and shortness of breath.   Cardiovascular: Negative for chest pain and leg swelling.  Endocrine: Positive for cold intolerance.  Musculoskeletal: Positive for joint swelling, arthralgias and gait problem.  Allergic/Immunologic: Negative for environmental allergies.    Past Medical History  Diagnosis Date  . Hypertension   . Arthritis   . High cholesterol   . Shingles   . Acid reflux   . PONV (postoperative nausea and vomiting)     Past Surgical History  Procedure Laterality Date  . Abdominal hysterectomy    . Back surgery    . Cholecystectomy    . Vascular surgery      varicose veins  . Colonoscopy  05/08/2009    SLF:A few scattered diverticula throughout the entire colon.  Small  internal hemorrhoids.  Otherwise, no polyps, masses, inflammatory changes, or AVMs seen.  Normal retroflexed view of the rectum  . Colonoscopy N/A 06/03/2014    SLF:  1. six colon polyps removed. 2. Moderate sized internal hemorrhoids. 3. The left colon is redundant 4.  Moderate diverticulosis throughout the entire examed colon. next TCS in10 years with overtube.  . Esophagogastroduodenoscopy N/A 06/03/2014    SLF:  1. small hiatal hernia  . Esophagogastroduodenoscopy N/A 02/16/2016   Procedure: ESOPHAGOGASTRODUODENOSCOPY (EGD);  Surgeon: Danie Binder, MD;  Location: [facility];  Service: Endoscopy;  Laterality: N/A;  1415 - moved to 5/8 @ 2:30 - office notified pt  . Givens capsule study N/A 03/19/2016    Procedure: GIVENS CAPSULE STUDY;  Surgeon: Danie Binder, MD;  Location: [facility];  Service: Endoscopy;  Laterality: N/A;  0700    Family History  Problem Relation Age of Onset  . Colon cancer Neg Hx   . Liver disease Neg Hx     Social History Social History  Substance Use Topics  . Smoking status: Never Smoker   . Smokeless tobacco: None     Comment: Never smoked  . Alcohol Use: No    Allergies  Allergen Reactions  . Codeine Nausea And Vomiting  . Penicillins     Yeast infection Has patient had a PCN reaction causing immediate rash, facial/tongue/throat swelling, SOB or lightheadedness with hypotension: No Has patient had a PCN reaction causing severe rash involving mucus membranes or skin necrosis: No Has patient had a PCN reaction that required hospitalization No Has patient had a PCN reaction occurring within the last 10 years: No If all of the above answers are "NO", then may proceed with Cephalosporin use.     Current Outpatient Prescriptions  Medication Sig Dispense Refill  . Ascorbic Acid (VITAMIN C PO) Take 1 tablet by mouth daily.    Marland Kitchen  aspirin EC 81 MG tablet Take 81 mg by mouth daily.    Marland Kitchen CALCIUM PO Take 1 tablet by mouth daily.    . Cyanocobalamin (VITAMIN B-12 PO) Take 1 tablet by mouth daily.    . diclofenac (VOLTAREN) 75 MG EC tablet Take 1 tablet (75 mg total) by mouth 2 (two) times daily with a meal. 60 tablet 2  . famciclovir (FAMVIR) 250 MG tablet Take 250 mg by mouth 2 (two) times daily.    . fexofenadine (ALLEGRA) 180 MG tablet Take 180 mg by mouth daily.    . hydrALAZINE (APRESOLINE) 25 MG tablet Take 25 mg by mouth 2 (two) times daily.    . hydrochlorothiazide (HYDRODIURIL) 25 MG tablet Take 25 mg by mouth daily.    Marland Kitchen  HYDROcodone-acetaminophen (NORCO) 7.5-325 MG per tablet Take 1 tablet by mouth 2 (two) times daily as needed for pain.     Marland Kitchen losartan (COZAAR) 100 MG tablet Take 100 mg by mouth daily.     Marland Kitchen lovastatin (MEVACOR) 20 MG tablet Take 20 mg by mouth at bedtime.    . Multiple Vitamins-Minerals (ONE-A-DAY 50 PLUS) TABS Take 1 tablet by mouth daily.    . Omega-3 Fatty Acids (FISH OIL) 1200 MG CAPS Take 2 capsules by mouth daily.    Marland Kitchen omeprazole (PRILOSEC) 20 MG capsule Take 1 capsule (20 mg total) by mouth daily. 30 capsule 11  . verapamil (CALAN-SR) 240 MG CR tablet Take 1 tablet by mouth daily.     No current facility-administered medications for this visit.     Physical Exam  Blood pressure 142/81, pulse 74, temperature 97.7 F (36.5 C), height 5\' 9"  (1.753 m), weight 205 lb 6.4 oz (93.169 kg).  Constitutional: overall normal hygiene, normal nutrition, well developed, normal grooming, normal body habitus. Assistive device:none  Musculoskeletal: gait and station Limp right, muscle tone and strength are normal, no tremors or atrophy is present.  .  Neurological: coordination overall normal.  Deep tendon reflex/nerve stretch intact.  Sensation normal.  Cranial nerves II-XII intact.   Skin:   normal overall no scars, lesions, ulcers or rashes. No psoriasis.  Psychiatric: Alert and oriented x 3.  Recent memory intact, remote memory unclear.  Normal mood and affect. Well groomed.  Good eye contact.  Cardiovascular: overall no swelling, no varicosities, no edema bilaterally, normal temperatures of the legs and arms, no clubbing, cyanosis and good capillary refill.  Lymphatic: palpation is normal.  The right lower extremity is examined:  Inspection:  Thigh:  Non-tender and no defects  Knee has swelling 1+ effusion.                        Joint tenderness is present                        Patient is tender over the medial joint line  Lower Leg:  Has normal appearance and no tenderness or  defects  Ankle:  Non-tender and no defects  Foot:  Non-tender and no defects Range of Motion:  Knee:  Range of motion is: 0-110                        Crepitus is  present  Ankle:  Range of motion is normal. Strength and Tone:  The right lower extremity has normal strength and tone. Stability:  Knee:  The knee is stable.  Ankle:  The ankle is  stable.  The right foot has swelling of the dorsum of her foot, more over the second metatarsal.   She has no redness.  NV intact.  ROM of the right ankle is full.  Left foot and left ankle negative.  X-rays were done of the right foot, reported separately.  The patient has been educated about the nature of the problem(s) and counseled on treatment options.  The patient appeared to understand what I have discussed and is in agreement with it.  Encounter Diagnoses  Name Primary?  . Right foot pain Yes  . Right knee pain   . Fracture of second metatarsal bone, right, closed, initial encounter     PLAN Call if any problems.  Precautions discussed.  Continue current medications.   Return to clinic 2 weeks   A Rx for post op shoe given.  X-rays of the right foot on return.  She may need a note to be out of work if they will not allow post op shoe at work.  Electronically Signed Sanjuana Kava, MD 7/5/201710:33 AM

## 2016-04-22 ENCOUNTER — Other Ambulatory Visit: Payer: Self-pay

## 2016-04-22 DIAGNOSIS — D649 Anemia, unspecified: Secondary | ICD-10-CM

## 2016-04-28 ENCOUNTER — Ambulatory Visit (HOSPITAL_COMMUNITY)
Admission: RE | Admit: 2016-04-28 | Discharge: 2016-04-28 | Disposition: A | Discharge status: Home / Self Care | Payer: BLUE CROSS/BLUE SHIELD | Source: Ambulatory Visit | Attending: Orthopaedic Surgery | Admitting: Orthopaedic Surgery

## 2016-04-28 ENCOUNTER — Ambulatory Visit: Payer: BLUE CROSS/BLUE SHIELD | Admitting: Orthopaedic Surgery

## 2016-04-28 ENCOUNTER — Encounter: Payer: Self-pay | Admitting: Orthopaedic Surgery

## 2016-04-28 VITALS — BP 139/83 | HR 72 | Temp 97.3°F | Ht 71.0 in | Wt 206.0 lb

## 2016-04-28 DIAGNOSIS — S92301D Fracture of unspecified metatarsal bone(s), right foot, subsequent encounter for fracture with routine healing: Secondary | ICD-10-CM | POA: Diagnosis not present

## 2016-04-28 NOTE — Progress Notes (Signed)
CC:  My right foot is better  She is using the post op shoe.  She has no new trauma.  Her pain is less.  X-rays from the hospital show no change in the fracture.  NV intact.  ROM full.  Encounter Diagnosis  Name Primary?  . Fracture of second metatarsal bone, right, with routine healing, subsequent encounter Yes    Return in one month  X-rays of the right foot on return.  Continue post op shoe.  Call if any problem.  Stay out of work.  Precautions discussed.  Electronically Signed Sanjuana Kava, MD 7/19/20179:28 AM

## 2016-04-29 ENCOUNTER — Telehealth: Payer: Self-pay | Admitting: Gastroenterology

## 2016-04-29 NOTE — Telephone Encounter (Signed)
PLEASE CALL PT. HER FERRITIN IS NORMAL.  THE LOW BLOOD COUNT IS MOST LIKELY DUE TO IRRITATION IN HER SMALL BOWEL DUE TO ASA AND VOLTAREN. FOLLOW UP IN OCT OR NOV 2017 WITH REPEAT CBC/FERRITIN ONE WEEK PRIOR TO HER OPV.

## 2016-04-29 NOTE — Telephone Encounter (Signed)
LMOM to call.

## 2016-04-30 ENCOUNTER — Other Ambulatory Visit: Payer: Self-pay

## 2016-04-30 DIAGNOSIS — D508 Other iron deficiency anemias: Secondary | ICD-10-CM

## 2016-04-30 NOTE — Progress Notes (Signed)
Opened in error

## 2016-04-30 NOTE — Telephone Encounter (Signed)
Pt is aware of results. 

## 2016-04-30 NOTE — Telephone Encounter (Signed)
Lab orders on file for 08/11/2016.

## 2016-05-19 ENCOUNTER — Ambulatory Visit (INDEPENDENT_AMBULATORY_CARE_PROVIDER_SITE_OTHER): Payer: BLUE CROSS/BLUE SHIELD | Admitting: Gastroenterology

## 2016-05-19 ENCOUNTER — Encounter: Payer: Self-pay | Admitting: Gastroenterology

## 2016-05-19 VITALS — BP 135/81 | HR 73 | Temp 98.7°F | Ht 69.0 in | Wt 211.6 lb

## 2016-05-19 DIAGNOSIS — K219 Gastro-esophageal reflux disease without esophagitis: Secondary | ICD-10-CM

## 2016-05-19 DIAGNOSIS — K2990 Gastroduodenitis, unspecified, without bleeding: Secondary | ICD-10-CM | POA: Diagnosis not present

## 2016-05-19 DIAGNOSIS — D649 Anemia, unspecified: Secondary | ICD-10-CM

## 2016-05-19 DIAGNOSIS — K2970 Gastritis, unspecified, without bleeding: Secondary | ICD-10-CM | POA: Diagnosis not present

## 2016-05-19 MED ORDER — OMEPRAZOLE 20 MG PO CPDR: 20.0000 mg | DELAYED_RELEASE_CAPSULE | Freq: Two times a day (BID) | ORAL | Status: DC

## 2016-05-19 NOTE — Assessment & Plan Note (Signed)
Plan to recheck labs in October. Patient never resumed iron after her capsule study. Given normal ferritin, we will continue to hold for now.

## 2016-05-19 NOTE — Progress Notes (Signed)
cc'ed to pcp °

## 2016-05-19 NOTE — Progress Notes (Signed)
Primary Care Physician: Rosita Fire, MD  Primary Gastroenterologist:  Barney Drain, MD   Chief Complaint  Patient presents with  . Follow-up    doing well    HPI: Jamie Hunt is a 65 y.o. female here for follow-up. H/o normocytic anemia (H/H 11/31 in 02/2016) and upper abd pain.  She underwent an EGD which revealed gastritis back in May. Bx negative for h.pylori. Givens capsule study done in June. 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  She is no longer on for her in. She continues aspirin daily. She had to add back an NSAID for significant arthritis. Her doctor recommended Aleve 1-2 per day. She tries to get by with once daily. She broke her toe since she last saw [place], this is healing nicely per patient. Currently on leave from work. She has to be very careful with her diet. Continues to have postprandial abdominal pain in the upper abdomen especially with certain foods such as high fiber foods, salads, slaw, strawberries, fried foods. Bowel movements are regular. No blood in the stool or melena.  Current Outpatient Prescriptions  Medication Sig Dispense Refill  . Ascorbic Acid (VITAMIN C PO) Take 1 tablet by mouth daily.    Marland Kitchen aspirin EC 81 MG tablet Take 81 mg by mouth daily.    Marland Kitchen CALCIUM PO Take 1 tablet by mouth daily.    . Cyanocobalamin (VITAMIN B-12 PO) Take 1 tablet by mouth daily.    . fexofenadine (ALLEGRA) 180 MG tablet Take 180 mg by mouth daily.    . hydrALAZINE (APRESOLINE) 25 MG tablet Take 25 mg by mouth 2 (two) times daily.    . hydrochlorothiazide (HYDRODIURIL) 25 MG tablet Take 25 mg by mouth daily.    Marland Kitchen HYDROcodone-acetaminophen (NORCO) 7.5-325 MG per tablet Take 1 tablet by mouth 2 (two) times daily as needed for pain.     Marland Kitchen losartan (COZAAR) 100 MG tablet Take 100 mg by mouth daily.     Marland Kitchen lovastatin (MEVACOR) 20 MG tablet Take 20 mg by mouth at bedtime.    . Multiple  Vitamins-Minerals (ONE-A-DAY 50 PLUS) TABS Take 1 tablet by mouth daily.    . Omega-3 Fatty Acids (FISH OIL) 1200 MG CAPS Take 2 capsules by mouth daily.    Marland Kitchen omeprazole (PRILOSEC) 20 MG capsule Take 1 capsule (20 mg total) by mouth daily. 30 capsule 11  . verapamil (CALAN-SR) 240 MG CR tablet Take 1 tablet by mouth daily.     No current facility-administered medications for this visit.     Allergies as of 05/19/2016 - Review Complete 05/19/2016  Allergen Reaction Noted  . Codeine Nausea And Vomiting 04/22/2014  . Penicillins  11/30/2011    ROS:  General: Negative for anorexia, weight loss, fever, chills, fatigue, weakness. ENT: Negative for hoarseness, difficulty swallowing , nasal congestion. CV: Negative for chest pain, angina, palpitations, dyspnea on exertion, peripheral edema.  Respiratory: Negative for dyspnea at rest, dyspnea on exertion, cough, sputum, wheezing.  GI: See history of present illness. GU:  Negative for dysuria, hematuria, urinary incontinence, urinary frequency, nocturnal urination.  Endo: Negative for unusual weight change.    Physical Examination:   BP 135/81   Pulse 73   Temp 98.7 F (37.1 C) (Oral)   Ht 5\' 9"  (1.753 m)   Wt 211 lb 9.6 oz (96 kg)   BMI 31.25 kg/m   General: Well-nourished, well-developed in no acute  distress.  Eyes: No icterus. Mouth: Oropharyngeal mucosa moist and pink , no lesions erythema or exudate. Lungs: Clear to auscultation bilaterally.  Heart: Regular rate and rhythm, no murmurs rubs or gallops.  Abdomen: Bowel sounds are normal, nontender, nondistended, no hepatosplenomegaly or masses, no abdominal bruits or hernia , no rebound or guarding.   Extremities: No lower extremity edema. No clubbing or deformities. Neuro: Alert and oriented x 4   Skin: Warm and dry, no jaundice.   Psych: Alert and cooperative, normal mood and affect.  Labs:  Lab Results  Component Value Date   WBC 5.0 02/16/2016   HGB 11.0 (L) 02/16/2016     HCT 31.0 (L) 02/16/2016   MCV 96.6 02/16/2016   PLT 206 02/16/2016   Lab Results  Component Value Date   FERRITIN 129 04/12/2016    Imaging Studies: Dg Foot Complete Right  Result Date: 04/28/2016 CLINICAL DATA:  Injury. EXAM: RIGHT FOOT COMPLETE - 3+ VIEW COMPARISON:  04/14/2016. FINDINGS: Diffuse degenerative change. Slightly displaced fracture of the base of right second metatarsal noted. No interim change from prior exam. No radiopaque soft tissue foreign bodies . IMPRESSION: Slightly displaced fracture noted at the base of the right second metatarsal. No interim change from prior exam. Electronically Signed   By: [place]   On: 04/28/2016 08:12

## 2016-05-19 NOTE — Patient Instructions (Signed)
1. You will be due for labs in 07/2016, we will send you a reminder letter.  2. Increase omeprazole to twice daily before meals for the next two months. Then you may go back to 1 to 2 times daily.

## 2016-05-19 NOTE — Assessment & Plan Note (Signed)
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 Plan to recheck labs in October for anemia. She remains on aspirin and switch to Voltaren to Aleve, trying to limit as much as possible. Would advise increasing omeprazole to 20 mg twice daily for the next 2 months. Further recommendations pending future labs.

## 2016-05-26 ENCOUNTER — Encounter: Payer: Self-pay | Admitting: Orthopaedic Surgery

## 2016-05-26 ENCOUNTER — Ambulatory Visit (INDEPENDENT_AMBULATORY_CARE_PROVIDER_SITE_OTHER): Payer: BLUE CROSS/BLUE SHIELD

## 2016-05-26 ENCOUNTER — Ambulatory Visit (INDEPENDENT_AMBULATORY_CARE_PROVIDER_SITE_OTHER): Payer: BLUE CROSS/BLUE SHIELD | Admitting: Orthopaedic Surgery

## 2016-05-26 VITALS — Temp 97.7°F | Ht 69.5 in | Wt 214.0 lb

## 2016-05-26 DIAGNOSIS — S92301D Fracture of unspecified metatarsal bone(s), right foot, subsequent encounter for fracture with routine healing: Secondary | ICD-10-CM

## 2016-05-26 NOTE — Patient Instructions (Signed)
May return to work June 05, 2016

## 2016-05-26 NOTE — Progress Notes (Signed)
CC:  My right foot is much better  She has been using the post op shoe on the right. She has no pain.  NV is intact.  X-rays were done today, reported separately.  Encounter Diagnosis  Name Primary?  . Fracture of second metatarsal bone, right, with routine healing, subsequent encounter Yes   She can return to work August 26.  She can come out of the post op shoe as tolerated.  Return in one month.  X-rays on return.  Call if any problem.  Precautions discussed.  Electronically Signed Sanjuana Kava, MD 8/16/20179:37 AM

## 2016-06-23 ENCOUNTER — Ambulatory Visit (INDEPENDENT_AMBULATORY_CARE_PROVIDER_SITE_OTHER): Payer: BLUE CROSS/BLUE SHIELD

## 2016-06-23 ENCOUNTER — Ambulatory Visit (INDEPENDENT_AMBULATORY_CARE_PROVIDER_SITE_OTHER): Payer: BLUE CROSS/BLUE SHIELD | Admitting: Orthopaedic Surgery

## 2016-06-23 DIAGNOSIS — S92901D Unspecified fracture of right foot, subsequent encounter for fracture with routine healing: Secondary | ICD-10-CM

## 2016-06-23 DIAGNOSIS — S92301D Fracture of unspecified metatarsal bone(s), right foot, subsequent encounter for fracture with routine healing: Secondary | ICD-10-CM

## 2016-06-23 MED ORDER — HYDROCODONE-ACETAMINOPHEN 5-325 MG PO TABS
1.0000 | ORAL_TABLET | ORAL | 0 refills | Status: DC | PRN
Start: 2016-06-23 — End: 2017-08-11

## 2016-06-23 MED ORDER — NABUMETONE 750 MG PO TABS: 750.0000 mg | ORAL_TABLET | Freq: Two times a day (BID) | ORAL | 5 refills | Status: DC

## 2016-06-23 NOTE — Progress Notes (Signed)
CC:  My foot does not hurt  Her right foot is not hurting her.  She is walking well.  She has no pain.  NV is intact.  ROM of the right ankle is full.  She has no swelling or redness.  X-rays were done and reported separately.  Encounter Diagnoses  Name Primary?  Marland Kitchen Foot fracture, right, with routine healing, subsequent encounter Yes  . Fracture of second metatarsal bone, right, with routine healing, subsequent encounter    Return as needed.  Electronically Signed Sanjuana Kava, MD 9/13/20179:32 AM

## 2016-07-13 ENCOUNTER — Other Ambulatory Visit: Payer: Self-pay

## 2016-07-13 DIAGNOSIS — D649 Anemia, unspecified: Secondary | ICD-10-CM

## 2016-08-04 LAB — CBC WITH DIFFERENTIAL/PLATELET
BASOS PCT: 0 %
Basophils Absolute: 0 cells/uL (ref 0–200)
EOS PCT: 2 %
Eosinophils Absolute: 118 cells/uL (ref 15–500)
HCT: 36.1 % (ref 35.0–45.0)
HEMOGLOBIN: 12.3 g/dL (ref 11.7–15.5)
LYMPHS ABS: 3186 {cells}/uL (ref 850–3900)
Lymphocytes Relative: 54 %
MCH: 32.5 pg (ref 27.0–33.0)
MCHC: 34.1 g/dL (ref 32.0–36.0)
MCV: 95.5 fL (ref 80.0–100.0)
MONOS PCT: 7 %
MPV: 9 fL (ref 7.5–12.5)
Monocytes Absolute: 413 cells/uL (ref 200–950)
NEUTROS ABS: 2183 {cells}/uL (ref 1500–7800)
Neutrophils Relative %: 37 %
PLATELETS: 258 10*3/uL (ref 140–400)
RBC: 3.78 MIL/uL — ABNORMAL LOW (ref 3.80–5.10)
RDW: 14 % (ref 11.0–15.0)
WBC: 5.9 10*3/uL (ref 3.8–10.8)

## 2016-08-04 LAB — FERRITIN: FERRITIN: 135 ng/mL (ref 20–288)

## 2016-10-26 ENCOUNTER — Telehealth: Payer: Self-pay | Admitting: Gastroenterology

## 2016-10-26 NOTE — Telephone Encounter (Signed)
Error. Wrong patient.

## 2016-10-26 NOTE — Telephone Encounter (Signed)
Short Stay called to say that [facility] wanted patient to have a repeat upper endoscopy with dilation in one month and to follow up in office in 4 months.

## 2016-12-09 ENCOUNTER — Ambulatory Visit (HOSPITAL_COMMUNITY)
Admission: RE | Admit: 2016-12-09 | Discharge: 2016-12-09 | Disposition: A | Discharge status: Home / Self Care | Payer: BLUE CROSS/BLUE SHIELD | Source: Ambulatory Visit | Attending: Internal Medicine | Admitting: Internal Medicine

## 2016-12-09 DIAGNOSIS — Z1231 Encounter for screening mammogram for malignant neoplasm of breast: Secondary | ICD-10-CM | POA: Insufficient documentation

## 2016-12-09 DIAGNOSIS — N6489 Other specified disorders of breast: Secondary | ICD-10-CM | POA: Diagnosis not present

## 2016-12-21 ENCOUNTER — Ambulatory Visit: Payer: BLUE CROSS/BLUE SHIELD | Admitting: Orthopaedic Surgery

## 2016-12-29 ENCOUNTER — Ambulatory Visit (INDEPENDENT_AMBULATORY_CARE_PROVIDER_SITE_OTHER): Payer: BLUE CROSS/BLUE SHIELD | Admitting: Orthopaedic Surgery

## 2016-12-29 ENCOUNTER — Encounter: Payer: Self-pay | Admitting: Orthopaedic Surgery

## 2016-12-29 ENCOUNTER — Ambulatory Visit (INDEPENDENT_AMBULATORY_CARE_PROVIDER_SITE_OTHER): Payer: BLUE CROSS/BLUE SHIELD

## 2016-12-29 ENCOUNTER — Ambulatory Visit: Payer: BLUE CROSS/BLUE SHIELD

## 2016-12-29 VITALS — BP 159/93 | HR 76 | Temp 97.9°F | Ht 69.5 in | Wt 208.0 lb

## 2016-12-29 DIAGNOSIS — M25552 Pain in left hip: Secondary | ICD-10-CM

## 2016-12-29 DIAGNOSIS — M5442 Lumbago with sciatica, left side: Secondary | ICD-10-CM

## 2016-12-29 MED ORDER — NAPROXEN 500 MG PO TABS: 500.0000 mg | ORAL_TABLET | Freq: Two times a day (BID) | ORAL | 5 refills | Status: DC

## 2016-12-29 NOTE — Progress Notes (Signed)
Patient Jamie Hunt, female DOB:August 17, 1951, 66 y.o. VHQ:[id number]  Chief Complaint  Patient presents with  . Leg Pain    left groin +  left leg pain    HPI  Jamie Hunt is a 66 y.o. female who has noticed some left hip pain and pain radiating down the left leg to the left foot at times.  It has been present for over a month.  She has no trauma.  She has no weakness.  She has tried Tylenol, ice and rest with no help.   HPI  Body mass index is 30.28 kg/m.  ROS  Review of Systems  HENT: Negative for congestion.   Respiratory: Negative for cough and shortness of breath.   Cardiovascular: Negative for chest pain and leg swelling.  Endocrine: Positive for cold intolerance.  Musculoskeletal: Positive for arthralgias, gait problem and joint swelling.  Allergic/Immunologic: Negative for environmental allergies.    Past Medical History:  Diagnosis Date  . Acid reflux   . Arthritis   . High cholesterol   . Hypertension   . PONV (postoperative nausea and vomiting)   . Shingles     Past Surgical History:  Procedure Laterality Date  . ABDOMINAL HYSTERECTOMY    . BACK SURGERY    . CHOLECYSTECTOMY    . COLONOSCOPY  05/08/2009   SLF:A few scattered diverticula throughout the entire colon.  Small  internal hemorrhoids.  Otherwise, no polyps, masses, inflammatory changes, or AVMs seen.  Normal retroflexed view of the rectum  . COLONOSCOPY N/A 06/03/2014   SLF:  1. six colon polyps removed. 2. Moderate sized internal hemorrhoids. 3. The left colon is redundant 4.  Moderate diverticulosis throughout the entire examed colon. next TCS in10 years with overtube.  . ESOPHAGOGASTRODUODENOSCOPY N/A 06/03/2014   SLF:  1. small hiatal hernia  . ESOPHAGOGASTRODUODENOSCOPY N/A 02/16/2016   slf: gastritis, schazki ring  . GIVENS CAPSULE STUDY N/A 03/19/2016   Procedure: GIVENS CAPSULE STUDY;  Surgeon: Danie Binder, MD;  Location: [facility];  Service: Endoscopy;  Laterality: N/A;  0700  .  VASCULAR SURGERY     varicose veins    Family History  Problem Relation Age of Onset  . Colon cancer Neg Hx   . Liver disease Neg Hx     Social History Social History  Substance Use Topics  . Smoking status: Never Smoker  . Smokeless tobacco: Never Used     Comment: Never smoked  . Alcohol use No    Allergies  Allergen Reactions  . Codeine Nausea And Vomiting  . Penicillins     Yeast infection Has patient had a PCN reaction causing immediate rash, facial/tongue/throat swelling, SOB or lightheadedness with hypotension: No Has patient had a PCN reaction causing severe rash involving mucus membranes or skin necrosis: No Has patient had a PCN reaction that required hospitalization No Has patient had a PCN reaction occurring within the last 10 years: No If all of the above answers are "NO", then may proceed with Cephalosporin use.     Current Outpatient Prescriptions  Medication Sig Dispense Refill  . Ascorbic Acid (VITAMIN C PO) Take 1 tablet by mouth daily.    Marland Kitchen aspirin EC 81 MG tablet Take 81 mg by mouth daily.    Marland Kitchen CALCIUM PO Take 1 tablet by mouth daily.    . Cyanocobalamin (VITAMIN B-12 PO) Take 1 tablet by mouth daily.    . fexofenadine (ALLEGRA) 180 MG tablet Take 180 mg by mouth daily.    Marland Kitchen  hydrALAZINE (APRESOLINE) 25 MG tablet Take 25 mg by mouth 2 (two) times daily.    . hydrochlorothiazide (HYDRODIURIL) 25 MG tablet Take 25 mg by mouth daily.    Marland Kitchen HYDROcodone-acetaminophen (NORCO/VICODIN) 5-325 MG tablet Take 1 tablet by mouth every 4 (four) hours as needed for moderate pain (Must last 14 days.Do not take and drive a car or use machinery.). 56 tablet 0  . losartan (COZAAR) 100 MG tablet Take 100 mg by mouth daily.     Marland Kitchen lovastatin (MEVACOR) 20 MG tablet Take 20 mg by mouth at bedtime.    . Multiple Vitamins-Minerals (ONE-A-DAY 50 PLUS) TABS Take 1 tablet by mouth daily.    . naproxen (NAPROSYN) 500 MG tablet Take 1 tablet (500 mg total) by mouth 2 (two) times  daily with a meal. 60 tablet 5  . Omega-3 Fatty Acids (FISH OIL) 1200 MG CAPS Take 2 capsules by mouth daily.    Marland Kitchen omeprazole (PRILOSEC) 20 MG capsule Take 1 capsule (20 mg total) by mouth 2 (two) times daily before a meal.    . verapamil (CALAN-SR) 240 MG CR tablet Take 1 tablet by mouth daily.     No current facility-administered medications for this visit.      Physical Exam  Blood pressure (!) 159/93, pulse 76, temperature 97.9 F (36.6 C), height 5' 9.5" (1.765 m), weight 208 lb (94.3 kg).  Constitutional: overall normal hygiene, normal nutrition, well developed, normal grooming, normal body habitus. Assistive device:none  Musculoskeletal: gait and station Limp none, muscle tone and strength are normal, no tremors or atrophy is present.  .  Neurological: coordination overall normal.  Deep tendon reflex/nerve stretch intact.  Sensation normal.  Cranial nerves II-XII intact.   Skin:   Normal overall no scars, lesions, ulcers or rashes. No psoriasis.  Psychiatric: Alert and oriented x 3.  Recent memory intact, remote memory unclear.  Normal mood and affect. Well groomed.  Good eye contact.  Cardiovascular: overall no swelling, no varicosities, no edema bilaterally, normal temperatures of the legs and arms, no clubbing, cyanosis and good capillary refill.  Lymphatic: palpation is normal.  Her left hip has full motion and no pain over the trochanteric area.  Spine/Pelvis examination:  Inspection:  Overall, sacoiliac joint benign and hips nontender; without crepitus or defects.   Thoracic spine inspection: Alignment normal without kyphosis present   Lumbar spine inspection:  Alignment  with normal lumbar lordosis, with scoliosis apparent.   Thoracic spine palpation:  without tenderness of spinal processes   Lumbar spine palpation: with tenderness of lumbar area; without tightness of lumbar muscles    Range of Motion:   Lumbar flexion, forward flexion is 35 without pain or  tenderness    Lumbar extension is 5 without pain or tenderness   Left lateral bend is Normal  without pain or tenderness   Right lateral bend is Normal without pain or tenderness   Straight leg raising is Normal   Strength & tone: Normal   Stability overall normal stability   X-rays of the lumbar spine and pelvis were done, reported separately.  The patient has been educated about the nature of the problem(s) and counseled on treatment options.  The patient appeared to understand what I have discussed and is in agreement with it.  Encounter Diagnoses  Name Primary?  . Acute left-sided low back pain with left-sided sciatica Yes  . Hip pain, acute, left     PLAN Call if any problems.  Precautions discussed. Begin Naprosyn  po.  Return to clinic 3 weeks   Electronically Signed Sanjuana Kava, MD 3/21/20189:41 AM

## 2017-01-19 ENCOUNTER — Encounter: Payer: Self-pay | Admitting: Orthopaedic Surgery

## 2017-01-19 ENCOUNTER — Ambulatory Visit (INDEPENDENT_AMBULATORY_CARE_PROVIDER_SITE_OTHER): Payer: BLUE CROSS/BLUE SHIELD | Admitting: Orthopaedic Surgery

## 2017-01-19 VITALS — BP 146/86 | HR 68 | Temp 97.5°F | Ht 69.5 in | Wt 208.0 lb

## 2017-01-19 DIAGNOSIS — M5441 Lumbago with sciatica, right side: Secondary | ICD-10-CM

## 2017-01-19 DIAGNOSIS — M25561 Pain in right knee: Secondary | ICD-10-CM

## 2017-01-19 DIAGNOSIS — G8929 Other chronic pain: Secondary | ICD-10-CM

## 2017-01-19 NOTE — Progress Notes (Signed)
Patient Jamie Hunt, female DOB:September 09, 1951, 66 y.o. EHU:[id number]  Chief Complaint  Patient presents with  . Follow-up    left hip, back    HPI  Jamie Hunt is a 66 y.o. female who has left hip pain, lower back pain and right knee pain. She is much improved in the hip and knee and back.  She has little pain and is walking better.  She uses a brace on the right knee.  She has no giving way or locking.  She has no paresthesias or weakness. She is doing her exercises and taking her medicine. HPI  Body mass index is 30.28 kg/m.  ROS  Review of Systems  HENT: Negative for congestion.   Respiratory: Negative for cough and shortness of breath.   Cardiovascular: Negative for chest pain and leg swelling.  Endocrine: Positive for cold intolerance.  Musculoskeletal: Positive for arthralgias, gait problem and joint swelling.  Allergic/Immunologic: Negative for environmental allergies.    Past Medical History:  Diagnosis Date  . Acid reflux   . Arthritis   . High cholesterol   . Hypertension   . PONV (postoperative nausea and vomiting)   . Shingles     Past Surgical History:  Procedure Laterality Date  . ABDOMINAL HYSTERECTOMY    . BACK SURGERY    . CHOLECYSTECTOMY    . COLONOSCOPY  05/08/2009   SLF:A few scattered diverticula throughout the entire colon.  Small  internal hemorrhoids.  Otherwise, no polyps, masses, inflammatory changes, or AVMs seen.  Normal retroflexed view of the rectum  . COLONOSCOPY N/A 06/03/2014   SLF:  1. six colon polyps removed. 2. Moderate sized internal hemorrhoids. 3. The left colon is redundant 4.  Moderate diverticulosis throughout the entire examed colon. next TCS in10 years with overtube.  . ESOPHAGOGASTRODUODENOSCOPY N/A 06/03/2014   SLF:  1. small hiatal hernia  . ESOPHAGOGASTRODUODENOSCOPY N/A 02/16/2016   slf: gastritis, schazki ring  . GIVENS CAPSULE STUDY N/A 03/19/2016   Procedure: GIVENS CAPSULE STUDY;  Surgeon: Danie Binder, MD;   Location: [facility];  Service: Endoscopy;  Laterality: N/A;  0700  . VASCULAR SURGERY     varicose veins    Family History  Problem Relation Age of Onset  . Colon cancer Neg Hx   . Liver disease Neg Hx     Social History Social History  Substance Use Topics  . Smoking status: Never Smoker  . Smokeless tobacco: Never Used     Comment: Never smoked  . Alcohol use No    Allergies  Allergen Reactions  . Codeine Nausea And Vomiting  . Penicillins     Yeast infection Has patient had a PCN reaction causing immediate rash, facial/tongue/throat swelling, SOB or lightheadedness with hypotension: No Has patient had a PCN reaction causing severe rash involving mucus membranes or skin necrosis: No Has patient had a PCN reaction that required hospitalization No Has patient had a PCN reaction occurring within the last 10 years: No If all of the above answers are "NO", then may proceed with Cephalosporin use.     Current Outpatient Prescriptions  Medication Sig Dispense Refill  . Ascorbic Acid (VITAMIN C PO) Take 1 tablet by mouth daily.    Marland Kitchen aspirin EC 81 MG tablet Take 81 mg by mouth daily.    Marland Kitchen CALCIUM PO Take 1 tablet by mouth daily.    . Cyanocobalamin (VITAMIN B-12 PO) Take 1 tablet by mouth daily.    . fexofenadine (ALLEGRA) 180 MG  tablet Take 180 mg by mouth daily.    . hydrALAZINE (APRESOLINE) 25 MG tablet Take 25 mg by mouth 2 (two) times daily.    . hydrochlorothiazide (HYDRODIURIL) 25 MG tablet Take 25 mg by mouth daily.    Marland Kitchen HYDROcodone-acetaminophen (NORCO/VICODIN) 5-325 MG tablet Take 1 tablet by mouth every 4 (four) hours as needed for moderate pain (Must last 14 days.Do not take and drive a car or use machinery.). 56 tablet 0  . losartan (COZAAR) 100 MG tablet Take 100 mg by mouth daily.     Marland Kitchen lovastatin (MEVACOR) 20 MG tablet Take 20 mg by mouth at bedtime.    . Multiple Vitamins-Minerals (ONE-A-DAY 50 PLUS) TABS Take 1 tablet by mouth daily.    . naproxen  (NAPROSYN) 500 MG tablet Take 1 tablet (500 mg total) by mouth 2 (two) times daily with a meal. 60 tablet 5  . Omega-3 Fatty Acids (FISH OIL) 1200 MG CAPS Take 2 capsules by mouth daily.    Marland Kitchen omeprazole (PRILOSEC) 20 MG capsule Take 1 capsule (20 mg total) by mouth 2 (two) times daily before a meal.    . verapamil (CALAN-SR) 240 MG CR tablet Take 1 tablet by mouth daily.     No current facility-administered medications for this visit.      Physical Exam  Blood pressure (!) 146/86, pulse 68, temperature 97.5 F (36.4 C), height 5' 9.5" (1.765 m), weight 208 lb (94.3 kg).  Constitutional: overall normal hygiene, normal nutrition, well developed, normal grooming, normal body habitus. Assistive device:neoprene sleeve brace  Musculoskeletal: gait and station Limp right, muscle tone and strength are normal, no tremors or atrophy is present.  .  Neurological: coordination overall normal.  Deep tendon reflex/nerve stretch intact.  Sensation normal.  Cranial nerves II-XII intact.   Skin:   Normal overall no scars, lesions, ulcers or rashes. No psoriasis.  Psychiatric: Alert and oriented x 3.  Recent memory intact, remote memory unclear.  Normal mood and affect. Well groomed.  Good eye contact.  Cardiovascular: overall no swelling, no varicosities, no edema bilaterally, normal temperatures of the legs and arms, no clubbing, cyanosis and good capillary refill.  Lymphatic: palpation is normal.  The right lower extremity is examined:  Inspection:  Thigh:  Non-tender and no defects  Knee has swelling 1+ effusion.                        Joint tenderness is present                        Patient is tender over the medial joint line  Lower Leg:  Has normal appearance and no tenderness or defects  Ankle:  Non-tender and no defects  Foot:  Non-tender and no defects Range of Motion:  Knee:  Range of motion is: 0-110                        Crepitus is  present  Ankle:  Range of motion is  normal. Strength and Tone:  The right lower extremity has normal strength and tone. Stability:  Knee:  The knee is stable.  Ankle:  The ankle is stable.  Spine/Pelvis examination:  Inspection:  Overall, sacoiliac joint benign and hips nontender; without crepitus or defects.   Thoracic spine inspection: Alignment normal without kyphosis present   Lumbar spine inspection:  Alignment  with normal lumbar lordosis, without scoliosis apparent.  Thoracic spine palpation:  without tenderness of spinal processes   Lumbar spine palpation: with tenderness of lumbar area; without tightness of lumbar muscles    Range of Motion:   Lumbar flexion, forward flexion is 45 without pain or tenderness    Lumbar extension is full without pain or tenderness   Left lateral bend is Normal  without pain or tenderness   Right lateral bend is Normal without pain or tenderness   Straight leg raising is Normal   Strength & tone: Normal   Stability overall normal stability     The patient has been educated about the nature of the problem(s) and counseled on treatment options.  The patient appeared to understand what I have discussed and is in agreement with it.  Encounter Diagnoses  Name Primary?  . Chronic right-sided low back pain with right-sided sciatica Yes  . Chronic pain of right knee     PLAN Call if any problems.  Precautions discussed.  Continue current medications.   Return to clinic 6 weeks   Electronically Signed Sanjuana Kava, MD 4/11/20189:48 AM

## 2017-03-02 ENCOUNTER — Ambulatory Visit (INDEPENDENT_AMBULATORY_CARE_PROVIDER_SITE_OTHER): Payer: BLUE CROSS/BLUE SHIELD | Admitting: Orthopaedic Surgery

## 2017-03-02 ENCOUNTER — Encounter: Payer: Self-pay | Admitting: Orthopaedic Surgery

## 2017-03-02 VITALS — BP 137/80 | HR 71 | Temp 97.5°F | Ht 69.5 in | Wt 209.0 lb

## 2017-03-02 DIAGNOSIS — G8929 Other chronic pain: Secondary | ICD-10-CM

## 2017-03-02 DIAGNOSIS — M25562 Pain in left knee: Secondary | ICD-10-CM | POA: Diagnosis not present

## 2017-03-02 NOTE — Progress Notes (Signed)
CC:  I have pain of my left knee. I would like an injection.  The patient has chronic pain of the left knee.  There is no recent trauma.  There is no redness.  Injections in the past have helped.  The knee has no redness, has an effusion and crepitus present.  ROM of the left knee is 0-110.  Impression:  Chronic knee pain left  Return: 1 month  PROCEDURE NOTE:  The patient requests injections of the left knee, verbal consent was obtained.  The left knee was prepped appropriately after time out was performed.   Sterile technique was observed and injection of 1 cc of Depo-Medrol 40 mg with several cc's of plain xylocaine. Anesthesia was provided by ethyl chloride and a 20-gauge needle was used to inject the knee area. The injection was tolerated well.  A band aid dressing was applied.  The patient was advised to apply ice later today and tomorrow to the injection sight as needed.  Electronically Signed Sanjuana Kava, MD 5/23/201810:19 AM

## 2017-03-31 ENCOUNTER — Ambulatory Visit (INDEPENDENT_AMBULATORY_CARE_PROVIDER_SITE_OTHER): Payer: BLUE CROSS/BLUE SHIELD | Admitting: Orthopaedic Surgery

## 2017-03-31 ENCOUNTER — Encounter: Payer: Self-pay | Admitting: Orthopaedic Surgery

## 2017-03-31 VITALS — BP 127/78 | HR 67 | Temp 98.1°F | Ht 69.5 in | Wt 209.0 lb

## 2017-03-31 DIAGNOSIS — M25562 Pain in left knee: Secondary | ICD-10-CM | POA: Diagnosis not present

## 2017-03-31 DIAGNOSIS — G8929 Other chronic pain: Secondary | ICD-10-CM | POA: Diagnosis not present

## 2017-03-31 NOTE — Progress Notes (Signed)
CC:  I have pain of my right knee. I would like an injection.  The patient has chronic pain of the right knee.  There is no recent trauma.  There is no redness.  Injections in the past have helped.  The knee has no redness, has an effusion and crepitus present.  ROM of the right knee is 0-110.  Impression:  Chronic knee pain right  Return: 6 weeks  PROCEDURE NOTE:  The patient requests injections of the right knee , verbal consent was obtained.  The right knee was prepped appropriately after time out was performed.   Sterile technique was observed and injection of 1 cc of Depo-Medrol 40 mg with several cc's of plain xylocaine. Anesthesia was provided by ethyl chloride and a 20-gauge needle was used to inject the knee area. The injection was tolerated well.  A band aid dressing was applied.  The patient was advised to apply ice later today and tomorrow to the injection sight as needed.  Electronically Signed Sanjuana Kava, MD 6/21/201810:22 AM

## 2017-05-12 ENCOUNTER — Encounter: Payer: Self-pay | Admitting: Orthopaedic Surgery

## 2017-05-12 ENCOUNTER — Ambulatory Visit (INDEPENDENT_AMBULATORY_CARE_PROVIDER_SITE_OTHER): Payer: BLUE CROSS/BLUE SHIELD | Admitting: Orthopaedic Surgery

## 2017-05-12 VITALS — BP 133/81 | HR 74 | Temp 98.6°F | Ht 69.5 in | Wt 210.0 lb

## 2017-05-12 DIAGNOSIS — M25561 Pain in right knee: Secondary | ICD-10-CM

## 2017-05-12 DIAGNOSIS — M25562 Pain in left knee: Secondary | ICD-10-CM

## 2017-05-12 DIAGNOSIS — G8929 Other chronic pain: Secondary | ICD-10-CM

## 2017-05-12 NOTE — Progress Notes (Signed)
Patient [name], female DOB:1951-04-21, 66 y.o. SPQ:[id number]  Chief Complaint  Patient presents with  . Follow-up    knee pain    HPI  Jamie Hunt is a 66 y.o. female who has bilateral knee pain.  She is stable. She has some swelling and popping but is better. She is better in the summer.  She has no new trauma, no giving way.  She is walking well. HPI  Body mass index is 30.57 kg/m.  ROS  Review of Systems  HENT: Negative for congestion.   Respiratory: Negative for cough and shortness of breath.   Cardiovascular: Negative for chest pain and leg swelling.  Endocrine: Positive for cold intolerance.  Musculoskeletal: Positive for arthralgias, gait problem and joint swelling.  Allergic/Immunologic: Negative for environmental allergies.    Past Medical History:  Diagnosis Date  . Acid reflux   . Arthritis   . High cholesterol   . Hypertension   . PONV (postoperative nausea and vomiting)   . Shingles     Past Surgical History:  Procedure Laterality Date  . ABDOMINAL HYSTERECTOMY    . BACK SURGERY    . CHOLECYSTECTOMY    . COLONOSCOPY  05/08/2009   SLF:A few scattered diverticula throughout the entire colon.  Small  internal hemorrhoids.  Otherwise, no polyps, masses, inflammatory changes, or AVMs seen.  Normal retroflexed view of the rectum  . COLONOSCOPY N/A 06/03/2014   SLF:  1. six colon polyps removed. 2. Moderate sized internal hemorrhoids. 3. The left colon is redundant 4.  Moderate diverticulosis throughout the entire examed colon. next TCS in10 years with overtube.  . ESOPHAGOGASTRODUODENOSCOPY N/A 06/03/2014   SLF:  1. small hiatal hernia  . ESOPHAGOGASTRODUODENOSCOPY N/A 02/16/2016   slf: gastritis, schazki ring  . GIVENS CAPSULE STUDY N/A 03/19/2016   Procedure: GIVENS CAPSULE STUDY;  Surgeon: Danie Binder, MD;  Location: [facility];  Service: Endoscopy;  Laterality: N/A;  0700  . VASCULAR SURGERY     varicose veins    Family History   Problem Relation Age of Onset  . Colon cancer Neg Hx   . Liver disease Neg Hx     Social History Social History  Substance Use Topics  . Smoking status: Never Smoker  . Smokeless tobacco: Never Used     Comment: Never smoked  . Alcohol use No    Allergies  Allergen Reactions  . Codeine Nausea And Vomiting  . Penicillins     Yeast infection Has patient had a PCN reaction causing immediate rash, facial/tongue/throat swelling, SOB or lightheadedness with hypotension: No Has patient had a PCN reaction causing severe rash involving mucus membranes or skin necrosis: No Has patient had a PCN reaction that required hospitalization No Has patient had a PCN reaction occurring within the last 10 years: No If all of the above answers are "NO", then may proceed with Cephalosporin use.     Current Outpatient Prescriptions  Medication Sig Dispense Refill  . Ascorbic Acid (VITAMIN C PO) Take 1 tablet by mouth daily.    Marland Kitchen aspirin EC 81 MG tablet Take 81 mg by mouth daily.    Marland Kitchen CALCIUM PO Take 1 tablet by mouth daily.    . Cyanocobalamin (VITAMIN B-12 PO) Take 1 tablet by mouth daily.    . fexofenadine (ALLEGRA) 180 MG tablet Take 180 mg by mouth daily.    . hydrALAZINE (APRESOLINE) 25 MG tablet Take 25 mg by mouth 2 (two) times daily.    Marland Kitchen  hydrochlorothiazide (HYDRODIURIL) 25 MG tablet Take 25 mg by mouth daily.    Marland Kitchen HYDROcodone-acetaminophen (NORCO/VICODIN) 5-325 MG tablet Take 1 tablet by mouth every 4 (four) hours as needed for moderate pain (Must last 14 days.Do not take and drive a car or use machinery.). 56 tablet 0  . losartan (COZAAR) 100 MG tablet Take 100 mg by mouth daily.     Marland Kitchen lovastatin (MEVACOR) 20 MG tablet Take 20 mg by mouth at bedtime.    . Multiple Vitamins-Minerals (ONE-A-DAY 50 PLUS) TABS Take 1 tablet by mouth daily.    . naproxen (NAPROSYN) 500 MG tablet Take 1 tablet (500 mg total) by mouth 2 (two) times daily with a meal. 60 tablet 5  . Omega-3 Fatty Acids (FISH  OIL) 1200 MG CAPS Take 2 capsules by mouth daily.    Marland Kitchen omeprazole (PRILOSEC) 20 MG capsule Take 1 capsule (20 mg total) by mouth 2 (two) times daily before a meal.    . verapamil (CALAN-SR) 240 MG CR tablet Take 1 tablet by mouth daily.     No current facility-administered medications for this visit.      Physical Exam  Blood pressure 133/81, pulse 74, temperature 98.6 F (37 C), height 5' 9.5" (1.765 m), weight 210 lb (95.3 kg).  Constitutional: overall normal hygiene, normal nutrition, well developed, normal grooming, normal body habitus. Assistive device:none  Musculoskeletal: gait and station Limp none, muscle tone and strength are normal, no tremors or atrophy is present.  .  Neurological: coordination overall normal.  Deep tendon reflex/nerve stretch intact.  Sensation normal.  Cranial nerves II-XII intact.   Skin:   Normal overall no scars, lesions, ulcers or rashes. No psoriasis.  Psychiatric: Alert and oriented x 3.  Recent memory intact, remote memory unclear.  Normal mood and affect. Well groomed.  Good eye contact.  Cardiovascular: overall no swelling, no varicosities, no edema bilaterally, normal temperatures of the legs and arms, no clubbing, cyanosis and good capillary refill.  Lymphatic: palpation is normal.  Her knees have bilateral crepitus.  ROM right 0-105 and left 0-110.  She has slight bilateral effusion.  Gait is good.  Muscle tone and strength are normal.  NV intact.  Knees are stable.  The patient has been educated about the nature of the problem(s) and counseled on treatment options.  The patient appeared to understand what I have discussed and is in agreement with it.  Encounter Diagnoses  Name Primary?  . Chronic pain of right knee Yes  . Chronic pain of left knee     PLAN Call if any problems.  Precautions discussed.  Continue current medications.   Return to clinic 3 months   Electronically Signed Sanjuana Kava, MD 8/2/20189:21 AM

## 2017-08-11 ENCOUNTER — Encounter: Payer: Self-pay | Admitting: Orthopaedic Surgery

## 2017-08-11 ENCOUNTER — Ambulatory Visit (INDEPENDENT_AMBULATORY_CARE_PROVIDER_SITE_OTHER): Payer: BLUE CROSS/BLUE SHIELD | Admitting: Orthopaedic Surgery

## 2017-08-11 VITALS — BP 119/74 | HR 73 | Temp 98.4°F | Ht 69.5 in | Wt 208.0 lb

## 2017-08-11 DIAGNOSIS — G8929 Other chronic pain: Secondary | ICD-10-CM | POA: Diagnosis not present

## 2017-08-11 DIAGNOSIS — M25562 Pain in left knee: Secondary | ICD-10-CM

## 2017-08-11 MED ORDER — NAPROXEN 500 MG PO TABS: 500.0000 mg | ORAL_TABLET | Freq: Two times a day (BID) | ORAL | 5 refills | Status: DC

## 2017-08-11 NOTE — Progress Notes (Signed)
CC:  I have pain of my left knee. I would like an injection.  The patient has chronic pain of the left knee.  There is no recent trauma.  There is no redness.  Injections in the past have helped.  The knee has no redness, has an effusion and crepitus present.  ROM of the left knee is 0-110.  Impression:  Chronic knee pain left  Return: 3 months  PROCEDURE NOTE:  The patient requests injections of the left knee, verbal consent was obtained.  The left knee was prepped appropriately after time out was performed.   Sterile technique was observed and injection of 1 cc of Depo-Medrol 40 mg with several cc's of plain xylocaine. Anesthesia was provided by ethyl chloride and a 20-gauge needle was used to inject the knee area. The injection was tolerated well.  A band aid dressing was applied.  The patient was advised to apply ice later today and tomorrow to the injection sight as needed.  I have called in Naprosyn for her.  Electronically [place], MD 11/1/20189:41 AM

## 2017-08-26 ENCOUNTER — Other Ambulatory Visit: Payer: Self-pay | Admitting: Orthopaedic Surgery

## 2017-09-01 ENCOUNTER — Encounter (HOSPITAL_COMMUNITY): Payer: Self-pay | Admitting: Emergency Medicine

## 2017-09-01 ENCOUNTER — Emergency Department (HOSPITAL_COMMUNITY)
Admission: EM | Admit: 2017-09-01 | Discharge: 2017-09-01 | Disposition: A | Discharge status: Home / Self Care | Payer: BLUE CROSS/BLUE SHIELD | Attending: Emergency Medicine | Admitting: Emergency Medicine

## 2017-09-01 DIAGNOSIS — I10 Essential (primary) hypertension: Secondary | ICD-10-CM | POA: Insufficient documentation

## 2017-09-01 DIAGNOSIS — Z7982 Long term (current) use of aspirin: Secondary | ICD-10-CM | POA: Diagnosis not present

## 2017-09-01 DIAGNOSIS — H6122 Impacted cerumen, left ear: Secondary | ICD-10-CM | POA: Diagnosis not present

## 2017-09-01 DIAGNOSIS — H9202 Otalgia, left ear: Secondary | ICD-10-CM | POA: Diagnosis present

## 2017-09-01 DIAGNOSIS — R0981 Nasal congestion: Secondary | ICD-10-CM | POA: Insufficient documentation

## 2017-09-01 DIAGNOSIS — Z79899 Other long term (current) drug therapy: Secondary | ICD-10-CM | POA: Insufficient documentation

## 2017-09-01 MED ORDER — FLUTICASONE PROPIONATE 50 MCG/ACT NA SUSP: 1.0000 | Freq: Every day | NASAL | 0 refills | Status: DC

## 2017-09-01 MED ORDER — HYDROGEN PEROXIDE 3 % EX SOLN
Freq: Once | CUTANEOUS | Status: AC
  Administered 2017-09-01: 1 via TOPICAL

## 2017-09-01 MED ORDER — HYDROGEN PEROXIDE 3 % EX SOLN
CUTANEOUS | Status: AC
  Administered 2017-09-01: 1 via TOPICAL
  Filled 2017-09-01: qty 473

## 2017-09-01 NOTE — ED Provider Notes (Signed)
[facility] EMERGENCY DEPARTMENT Provider Note   CSN: [id number] Arrival date & time: 09/01/17  5809     History   Chief Complaint Chief Complaint  Patient presents with  . Otalgia    HPI Jamie Hunt is a 66 y.o. female.  The history is provided by the patient.  Otalgia  This is a new problem. The current episode started 2 days ago. There is pain in the left ear. The problem occurs constantly. The problem has not changed since onset.There has been no fever. The pain is at a severity of 5/10. The pain is moderate. Associated symptoms include rhinorrhea and cough. Pertinent negatives include no ear discharge, no sore throat and no abdominal pain. Associated symptoms comments: Cough, nasal congestion. Her past medical history does not include chronic ear infection.    Past Medical History:  Diagnosis Date  . Acid reflux   . Arthritis   . High cholesterol   . Hypertension   . PONV (postoperative nausea and vomiting)   . Shingles     Patient Active Problem List   Diagnosis Date Noted  . Gastritis and gastroduodenitis 05/19/2016  . Normocytic anemia 03/03/2016  . Rectal bleeding 05/28/2014  . Abdominal pain, bilateral upper quadrant 05/28/2014    Past Surgical History:  Procedure Laterality Date  . ABDOMINAL HYSTERECTOMY    . BACK SURGERY    . CHOLECYSTECTOMY    . COLONOSCOPY  05/08/2009   SLF:A few scattered diverticula throughout the entire colon.  Small  internal hemorrhoids.  Otherwise, no polyps, masses, inflammatory changes, or AVMs seen.  Normal retroflexed view of the rectum  . COLONOSCOPY N/A 06/03/2014   SLF:  1. six colon polyps removed. 2. Moderate sized internal hemorrhoids. 3. The left colon is redundant 4.  Moderate diverticulosis throughout the entire examed colon. next TCS in10 years with overtube.  . ESOPHAGOGASTRODUODENOSCOPY N/A 06/03/2014   SLF:  1. small hiatal hernia  . ESOPHAGOGASTRODUODENOSCOPY N/A 02/16/2016   slf: gastritis, schazki ring  .  GIVENS CAPSULE STUDY N/A 03/19/2016   Procedure: GIVENS CAPSULE STUDY;  Surgeon: Danie Binder, MD;  Location: [facility];  Service: Endoscopy;  Laterality: N/A;  0700  . VASCULAR SURGERY     varicose veins    OB History    No data available       Home Medications    Prior to Admission medications   Medication Sig Start Date End Date Taking? Authorizing Provider  Ascorbic Acid (VITAMIN C PO) Take 1 tablet by mouth daily.    [provider]  aspirin EC 81 MG tablet Take 81 mg by mouth daily.    [provider]  CALCIUM PO Take 1 tablet by mouth daily.    [provider]  Cyanocobalamin (VITAMIN B-12 PO) Take 1 tablet by mouth daily.    [provider]  fexofenadine (ALLEGRA) 180 MG tablet Take 180 mg by mouth daily.    [provider]  fluticasone (FLONASE) 50 MCG/ACT nasal spray Place 1 spray into both nostrils daily for 10 days. 09/01/17 09/11/17  Evalee Jefferson, PA-C  hydrALAZINE (APRESOLINE) 25 MG tablet Take 25 mg by mouth 2 (two) times daily.    [provider]  hydrochlorothiazide (HYDRODIURIL) 25 MG tablet Take 25 mg by mouth daily.    [provider]  losartan (COZAAR) 100 MG tablet Take 100 mg by mouth daily.  10/27/15   [provider]  lovastatin (MEVACOR) 20 MG tablet Take 20 mg by mouth  at bedtime.    [provider]  Multiple Vitamins-Minerals (ONE-A-DAY 50 PLUS) TABS Take 1 tablet by mouth daily.    [provider]  naproxen (NAPROSYN) 500 MG tablet Take 1 tablet (500 mg total) by mouth 2 (two) times daily with a meal. 08/11/17   Sanjuana Kava, MD  naproxen (NAPROSYN) 500 MG tablet TAKE 1 TABLET BY MOUTH TWICE DAILY WITH  A  MEAL 08/29/17   Sanjuana Kava, MD  Omega-3 Fatty Acids (FISH OIL) 1200 MG CAPS Take 2 capsules by mouth daily.    [provider]  omeprazole (PRILOSEC) 20 MG capsule Take 1 capsule (20 mg total) by mouth 2 (two) times daily before a meal. 05/19/16   Mahala Menghini, PA-C  verapamil (CALAN-SR) 240 MG CR tablet Take 1 tablet by mouth daily. 02/12/16   [provider]    Family History Family History  Problem Relation Age of Onset  . Colon cancer Neg Hx   . Liver disease Neg Hx     Social History Social History   Tobacco Use  . Smoking status: Never Smoker  . Smokeless tobacco: Never Used  . Tobacco comment: Never smoked  Substance Use Topics  . Alcohol use: No    Alcohol/week: 0.0 oz  . Drug use: No     Allergies   Codeine and Penicillins   Review of Systems Review of Systems  Constitutional: Negative for chills and fever.  HENT: Positive for congestion, ear pain and rhinorrhea. Negative for ear discharge, sinus pressure, sore throat, trouble swallowing and voice change.   Eyes: Negative for discharge.  Respiratory: Positive for cough. Negative for shortness of breath, wheezing and stridor.   Cardiovascular: Negative for chest pain.  Gastrointestinal: Negative for abdominal pain.  Genitourinary: Negative.      Physical Exam Updated Vital Signs BP (!) 172/94 (BP Location: Right Arm)   Pulse 90   Temp 98.2 F (36.8 C) (Oral)   Resp 16   Ht 5\' 10"  (1.778 m)   Wt 96.2 kg (212 lb)   SpO2 100%   BMI 30.42 kg/m   Physical Exam  Constitutional: She is oriented to person, place, and time. She appears well-developed and well-nourished.  HENT:  Head: Normocephalic and atraumatic.  Right Ear: Tympanic membrane and ear canal normal.  Left Ear: Ear canal normal.  Nose: Mucosal edema and rhinorrhea present.  Mouth/Throat: Uvula is midline, oropharynx is clear and moist and mucous membranes are normal. No oropharyngeal exudate, posterior oropharyngeal edema, posterior oropharyngeal erythema or tonsillar abscesses.  Left ear cerumen impaction  Eyes: Conjunctivae are normal.  Cardiovascular: Normal rate and normal heart sounds.  Pulmonary/Chest: Effort normal. No respiratory distress. She has no wheezes. She has no  rales.  Abdominal: Soft. There is no tenderness.  Musculoskeletal: Normal range of motion.  Neurological: She is alert and oriented to person, place, and time.  Skin: Skin is warm and dry. No rash noted.  Psychiatric: She has a normal mood and affect.     ED Treatments / Results  Labs (all labs ordered are listed, but only abnormal results are displayed) Labs Reviewed - No data to display  EKG  EKG Interpretation None       Radiology No results found.  Procedures .Ear Cerumen Removal Date/Time: 09/01/2017 8:46 AM Performed by: Evalee Jefferson, PA-C Authorized by: Evalee Jefferson, PA-C   Consent:    Consent obtained:  Verbal   Consent given by:  Patient   Risks discussed:  Bleeding,  dizziness, incomplete removal, pain and TM perforation   Alternatives discussed:  No treatment Procedure details:    Location:  L ear   Procedure type: irrigation   Post-procedure details:    Inspection:  TM intact   Hearing quality:  Improved   Patient tolerance of procedure:  Tolerated well, no immediate complications   (including critical care time)    Medications Ordered in ED Medications  hydrogen peroxide 3 % external solution (1 application Topical Given 09/01/17 0833)     Initial Impression / Assessment and Plan / ED Course  I have reviewed the triage vital signs and the nursing notes.  Pertinent labs & imaging results that were available during my care of the patient were reviewed by me and considered in my medical decision making (see chart for details).       Final Clinical Impressions(s) / ED Diagnoses   Final diagnoses:  Impacted cerumen of left ear  Nasal congestion    ED Discharge Orders        Ordered    fluticasone (FLONASE) 50 MCG/ACT nasal spray  Daily     09/01/17 0844       Evalee Jefferson, PA-C 09/01/17 7972    Virgel Manifold, MD 09/01/17 [phone]

## 2017-09-01 NOTE — ED Triage Notes (Signed)
Pt reports ear congestion, worse on left than right with cough.  States coughing up clear phlegm.

## 2017-09-01 NOTE — Discharge Instructions (Signed)
You may add the nasal spray to your regimen of Allegra which may help with allergy symptoms and nasal/sinus congestion.

## 2017-09-01 NOTE — ED Notes (Signed)
Ear wax removal performed in left ear with return of brown particles. Pt reports ear feels "slightly better, still stuffy".

## 2017-09-01 NOTE — ED Provider Notes (Signed)
Medical screening examination/treatment/procedure(s) were conducted as a shared visit with non-physician practitioner(s) and myself.  I personally evaluated the patient during the encounter.   EKG Interpretation None      10F with ear fullness. Cerumen impaction on L which was cleared. TMs look fine. May have some degree of eustachian tube dysfunction as well.    Virgel Manifold, MD 09/01/17 [phone]

## 2017-11-09 ENCOUNTER — Other Ambulatory Visit (HOSPITAL_COMMUNITY): Payer: Self-pay | Admitting: Internal Medicine

## 2017-11-09 DIAGNOSIS — Z1231 Encounter for screening mammogram for malignant neoplasm of breast: Secondary | ICD-10-CM

## 2017-11-15 ENCOUNTER — Encounter: Payer: Self-pay | Admitting: Orthopaedic Surgery

## 2017-11-15 ENCOUNTER — Ambulatory Visit (INDEPENDENT_AMBULATORY_CARE_PROVIDER_SITE_OTHER): Payer: BLUE CROSS/BLUE SHIELD | Admitting: Orthopaedic Surgery

## 2017-11-15 VITALS — BP 141/82 | HR 71 | Ht 69.5 in | Wt 207.0 lb

## 2017-11-15 DIAGNOSIS — G8929 Other chronic pain: Secondary | ICD-10-CM | POA: Diagnosis not present

## 2017-11-15 DIAGNOSIS — M25512 Pain in left shoulder: Secondary | ICD-10-CM

## 2017-11-15 DIAGNOSIS — M25562 Pain in left knee: Secondary | ICD-10-CM | POA: Diagnosis not present

## 2017-11-15 NOTE — Progress Notes (Signed)
CC:  I have pain of my left knee. I would like an injection.  The patient has chronic pain of the left knee.  There is no recent trauma.  There is no redness.  Injections in the past have helped.  The knee has no redness, has an effusion and crepitus present.  ROM of the left knee is 0-110.  Impression:  Chronic knee pain left  Return: 3 months  PROCEDURE NOTE:  The patient requests injections of the left knee, verbal consent was obtained.  The left knee was prepped appropriately after time out was performed.   Sterile technique was observed and injection of 1 cc of Depo-Medrol 40 mg with several cc's of plain xylocaine. Anesthesia was provided by ethyl chloride and a 20-gauge needle was used to inject the knee area. The injection was tolerated well.  A band aid dressing was applied.  The patient was advised to apply ice later today and tomorrow to the injection sight as needed.  PROCEDURE NOTE:  The patient request injection, verbal consent was obtained.  The left shoulder was prepped appropriately after time out was performed.   Sterile technique was observed and injection of 1 cc of Depo-Medrol 40 mg with several cc's of plain xylocaine. Anesthesia was provided by ethyl chloride and a 20-gauge needle was used to inject the shoulder area. A posterior approach was used.  The injection was tolerated well.  A band aid dressing was applied.  The patient was advised to apply ice later today and tomorrow to the injection sight as needed.  Electronically Signed Sanjuana Kava, MD 2/5/201910:01 AM

## 2017-12-12 ENCOUNTER — Other Ambulatory Visit (HOSPITAL_COMMUNITY): Payer: Self-pay | Admitting: Internal Medicine

## 2017-12-12 ENCOUNTER — Encounter (HOSPITAL_COMMUNITY): Payer: Self-pay

## 2017-12-12 ENCOUNTER — Ambulatory Visit (HOSPITAL_COMMUNITY)
Admission: RE | Admit: 2017-12-12 | Discharge: 2017-12-12 | Disposition: A | Discharge status: Home / Self Care | Payer: BLUE CROSS/BLUE SHIELD | Source: Ambulatory Visit | Attending: Internal Medicine | Admitting: Internal Medicine

## 2017-12-12 DIAGNOSIS — Z1231 Encounter for screening mammogram for malignant neoplasm of breast: Secondary | ICD-10-CM | POA: Diagnosis present

## 2018-02-21 ENCOUNTER — Encounter: Payer: Self-pay | Admitting: Orthopaedic Surgery

## 2018-02-21 ENCOUNTER — Ambulatory Visit: Payer: BLUE CROSS/BLUE SHIELD | Admitting: Orthopaedic Surgery

## 2018-02-21 DIAGNOSIS — M25512 Pain in left shoulder: Secondary | ICD-10-CM

## 2018-02-21 DIAGNOSIS — M25562 Pain in left knee: Secondary | ICD-10-CM | POA: Diagnosis not present

## 2018-02-21 DIAGNOSIS — G8929 Other chronic pain: Secondary | ICD-10-CM | POA: Diagnosis not present

## 2018-02-21 NOTE — Progress Notes (Signed)
PROCEDURE NOTE:  The patient requests injections of the left knee , verbal consent was obtained.  The left knee was prepped appropriately after time out was performed.   Sterile technique was observed and injection of 1 cc of Depo-Medrol 40 mg with several cc's of plain xylocaine. Anesthesia was provided by ethyl chloride and a 20-gauge needle was used to inject the knee area. The injection was tolerated well.  A band aid dressing was applied.  The patient was advised to apply ice later today and tomorrow to the injection sight as needed.  PROCEDURE NOTE:  The patient request injection, verbal consent was obtained.  The left shoulder was prepped appropriately after time out was performed.   Sterile technique was observed and injection of 1 cc of Depo-Medrol 40 mg with several cc's of plain xylocaine. Anesthesia was provided by ethyl chloride and a 20-gauge needle was used to inject the shoulder area. A posterior approach was used.  The injection was tolerated well.  A band aid dressing was applied.  The patient was advised to apply ice later today and tomorrow to the injection sight as needed.  Return in two months.  Call if any problem.  Precautions discussed.   Electronically Signed Sanjuana Kava, MD 5/14/20199:14 AM

## 2018-04-26 ENCOUNTER — Encounter: Payer: Self-pay | Admitting: Orthopaedic Surgery

## 2018-04-26 ENCOUNTER — Ambulatory Visit: Payer: BLUE CROSS/BLUE SHIELD | Admitting: Orthopaedic Surgery

## 2018-04-26 DIAGNOSIS — G8929 Other chronic pain: Secondary | ICD-10-CM | POA: Diagnosis not present

## 2018-04-26 DIAGNOSIS — M25562 Pain in left knee: Secondary | ICD-10-CM | POA: Diagnosis not present

## 2018-04-26 DIAGNOSIS — M25512 Pain in left shoulder: Secondary | ICD-10-CM | POA: Diagnosis not present

## 2018-04-26 MED ORDER — HYDROCODONE-ACETAMINOPHEN 5-325 MG PO TABS: ORAL_TABLET | ORAL | 0 refills | Status: DC

## 2018-04-26 NOTE — Progress Notes (Signed)
CC:  I have pain of my left knee. I would like an injection.  The patient has chronic pain of the left knee.  There is no recent trauma.  There is no redness.  Injections in the past have helped.  The knee has no redness, has an effusion and crepitus present.  ROM of the left knee is 0-105.  Impression:  Chronic knee pain left  Return: 2 months  PROCEDURE NOTE:  The patient requests injections of the left knee, verbal consent was obtained.  The left knee was prepped appropriately after time out was performed.   Sterile technique was observed and injection of 1 cc of Depo-Medrol 40 mg with several cc's of plain xylocaine. Anesthesia was provided by ethyl chloride and a 20-gauge needle was used to inject the knee area. The injection was tolerated well.  A band aid dressing was applied.  The patient was advised to apply ice later today and tomorrow to the injection sight as needed.  PROCEDURE NOTE:  The patient request injection, verbal consent was obtained.  The left shoulder was prepped appropriately after time out was performed.   Sterile technique was observed and injection of 1 cc of Depo-Medrol 40 mg with several cc's of plain xylocaine. Anesthesia was provided by ethyl chloride and a 20-gauge needle was used to inject the shoulder area. A posterior approach was used.  The injection was tolerated well.  A band aid dressing was applied.  The patient was advised to apply ice later today and tomorrow to the injection sight as needed.  I have reviewed the [place] web site prior to prescribing narcotic medicine for this patient.  Electronically Signed Sanjuana Kava, MD 7/17/20199:36 AM

## 2018-07-05 ENCOUNTER — Encounter: Payer: Self-pay | Admitting: Orthopaedic Surgery

## 2018-07-05 ENCOUNTER — Ambulatory Visit: Payer: BLUE CROSS/BLUE SHIELD | Admitting: Orthopaedic Surgery

## 2018-07-05 VITALS — BP 136/79 | HR 90 | Ht 69.5 in | Wt 212.0 lb

## 2018-07-05 DIAGNOSIS — M25512 Pain in left shoulder: Secondary | ICD-10-CM

## 2018-07-05 DIAGNOSIS — G8929 Other chronic pain: Secondary | ICD-10-CM | POA: Diagnosis not present

## 2018-07-05 DIAGNOSIS — M25562 Pain in left knee: Secondary | ICD-10-CM

## 2018-07-05 MED ORDER — HYDROCODONE-ACETAMINOPHEN 5-325 MG PO TABS: ORAL_TABLET | ORAL | 0 refills | Status: DC

## 2018-07-05 NOTE — Progress Notes (Signed)
PROCEDURE NOTE:  The patient request injection, verbal consent was obtained.  The left shoulder was prepped appropriately after time out was performed.   Sterile technique was observed and injection of 1 cc of Depo-Medrol 40 mg with several cc's of plain xylocaine. Anesthesia was provided by ethyl chloride and a 20-gauge needle was used to inject the shoulder area. A posterior approach was used.  The injection was tolerated well.  A band aid dressing was applied.  The patient was advised to apply ice later today and tomorrow to the injection sight as needed.  PROCEDURE NOTE:  The patient requests injections of the left knee , verbal consent was obtained.  The left knee was prepped appropriately after time out was performed.   Sterile technique was observed and injection of 1 cc of Depo-Medrol 40 mg with several cc's of plain xylocaine. Anesthesia was provided by ethyl chloride and a 20-gauge needle was used to inject the knee area. The injection was tolerated well.  A band aid dressing was applied.  The patient was advised to apply ice later today and tomorrow to the injection sight as needed.  Encounter Diagnoses  Name Primary?  . Chronic pain of left knee Yes  . Chronic left shoulder pain    I have reviewed the [place] Controlled Substance Reporting System web site prior to prescribing narcotic medicine for this patient.   Return in two months.  Call if any problem.  Precautions discussed.   Electronically Signed Sanjuana Kava, MD 9/25/20199:42 AM

## 2018-08-30 ENCOUNTER — Encounter: Payer: Self-pay | Admitting: Orthopaedic Surgery

## 2018-08-30 ENCOUNTER — Ambulatory Visit: Payer: BLUE CROSS/BLUE SHIELD | Admitting: Orthopaedic Surgery

## 2018-08-30 DIAGNOSIS — G8929 Other chronic pain: Secondary | ICD-10-CM | POA: Diagnosis not present

## 2018-08-30 DIAGNOSIS — M25562 Pain in left knee: Secondary | ICD-10-CM | POA: Diagnosis not present

## 2018-08-30 DIAGNOSIS — M25512 Pain in left shoulder: Secondary | ICD-10-CM | POA: Diagnosis not present

## 2018-08-30 NOTE — Progress Notes (Signed)
PROCEDURE NOTE:  The patient requests injections of the left knee , verbal consent was obtained.  The left knee was prepped appropriately after time out was performed.   Sterile technique was observed and injection of 1 cc of Depo-Medrol 40 mg with several cc's of plain xylocaine. Anesthesia was provided by ethyl chloride and a 20-gauge needle was used to inject the knee area. The injection was tolerated well.  A band aid dressing was applied.  The patient was advised to apply ice later today and tomorrow to the injection sight as needed.  PROCEDURE NOTE:  The patient request injection, verbal consent was obtained.  The left shoulder was prepped appropriately after time out was performed.   Sterile technique was observed and injection of 1 cc of Depo-Medrol 40 mg with several cc's of plain xylocaine. Anesthesia was provided by ethyl chloride and a 20-gauge needle was used to inject the shoulder area. A posterior approach was used.  The injection was tolerated well.  A band aid dressing was applied.  The patient was advised to apply ice later today and tomorrow to the injection sight as needed.  Return in two months.  Call if any problem.  Precautions discussed.   Electronically Signed Sanjuana Kava, MD 11/20/20199:49 AM

## 2018-10-31 ENCOUNTER — Ambulatory Visit: Payer: PPO | Admitting: Orthopaedic Surgery

## 2018-10-31 ENCOUNTER — Encounter: Payer: Self-pay | Admitting: Orthopaedic Surgery

## 2018-10-31 DIAGNOSIS — M25562 Pain in left knee: Secondary | ICD-10-CM

## 2018-10-31 DIAGNOSIS — M25512 Pain in left shoulder: Secondary | ICD-10-CM

## 2018-10-31 DIAGNOSIS — G8929 Other chronic pain: Secondary | ICD-10-CM

## 2018-10-31 NOTE — Progress Notes (Signed)
PROCEDURE NOTE:  The patient request injection, verbal consent was obtained.  The left shoulder was prepped appropriately after time out was performed.   Sterile technique was observed and injection of 1 cc of Depo-Medrol 40 mg with several cc's of plain xylocaine. Anesthesia was provided by ethyl chloride and a 20-gauge needle was used to inject the shoulder area. A posterior approach was used.  The injection was tolerated well.  A band aid dressing was applied.  The patient was advised to apply ice later today and tomorrow to the injection sight as needed.  PROCEDURE NOTE:  The patient requests injections of the left knee , verbal consent was obtained.  The left knee was prepped appropriately after time out was performed.   Sterile technique was observed and injection of 1 cc of Depo-Medrol 40 mg with several cc's of plain xylocaine. Anesthesia was provided by ethyl chloride and a 20-gauge needle was used to inject the knee area. The injection was tolerated well.  A band aid dressing was applied.  The patient was advised to apply ice later today and tomorrow to the injection sight as needed.  Return in two months.  Electronically Signed Sanjuana Kava, MD 1/21/20209:22 AM

## 2018-11-10 ENCOUNTER — Other Ambulatory Visit (HOSPITAL_COMMUNITY): Payer: Self-pay | Admitting: Internal Medicine

## 2018-11-10 DIAGNOSIS — Z1231 Encounter for screening mammogram for malignant neoplasm of breast: Secondary | ICD-10-CM

## 2018-12-15 ENCOUNTER — Other Ambulatory Visit: Payer: Self-pay | Admitting: *Deleted

## 2018-12-15 ENCOUNTER — Encounter: Payer: Self-pay | Admitting: *Deleted

## 2018-12-18 ENCOUNTER — Ambulatory Visit (HOSPITAL_COMMUNITY)
Admission: RE | Admit: 2018-12-18 | Discharge: 2018-12-18 | Disposition: A | Discharge status: Home / Self Care | Payer: PPO | Source: Ambulatory Visit | Attending: Internal Medicine | Admitting: Internal Medicine

## 2018-12-18 DIAGNOSIS — Z1231 Encounter for screening mammogram for malignant neoplasm of breast: Secondary | ICD-10-CM | POA: Diagnosis not present

## 2018-12-25 DIAGNOSIS — K219 Gastro-esophageal reflux disease without esophagitis: Secondary | ICD-10-CM | POA: Diagnosis not present

## 2018-12-25 DIAGNOSIS — I10 Essential (primary) hypertension: Secondary | ICD-10-CM | POA: Diagnosis not present

## 2018-12-25 DIAGNOSIS — Z6832 Body mass index (BMI) 32.0-32.9, adult: Secondary | ICD-10-CM | POA: Diagnosis not present

## 2018-12-25 DIAGNOSIS — M1990 Unspecified osteoarthritis, unspecified site: Secondary | ICD-10-CM | POA: Diagnosis not present

## 2018-12-28 ENCOUNTER — Other Ambulatory Visit: Payer: Self-pay

## 2018-12-28 ENCOUNTER — Ambulatory Visit: Payer: PPO | Admitting: Orthopaedic Surgery

## 2018-12-28 ENCOUNTER — Encounter: Payer: Self-pay | Admitting: Orthopaedic Surgery

## 2018-12-28 VITALS — BP 136/86 | HR 81 | Ht 69.5 in | Wt 213.0 lb

## 2018-12-28 DIAGNOSIS — G8929 Other chronic pain: Secondary | ICD-10-CM | POA: Diagnosis not present

## 2018-12-28 DIAGNOSIS — M25562 Pain in left knee: Secondary | ICD-10-CM | POA: Diagnosis not present

## 2018-12-28 DIAGNOSIS — M25512 Pain in left shoulder: Secondary | ICD-10-CM

## 2018-12-28 NOTE — Progress Notes (Signed)
PROCEDURE NOTE:  The patient requests injections of the left knee , verbal consent was obtained.  The left knee was prepped appropriately after time out was performed.   Sterile technique was observed and injection of 1 cc of Depo-Medrol 40 mg with several cc's of plain xylocaine. Anesthesia was provided by ethyl chloride and a 20-gauge needle was used to inject the knee area. The injection was tolerated well.  A band aid dressing was applied.  The patient was advised to apply ice later today and tomorrow to the injection sight as needed.  PROCEDURE NOTE:  The patient request injection, verbal consent was obtained.  The left shoulder was prepped appropriately after time out was performed.   Sterile technique was observed and injection of 1 cc of Depo-Medrol 40 mg with several cc's of plain xylocaine. Anesthesia was provided by ethyl chloride and a 20-gauge needle was used to inject the shoulder area. A posterior approach was used.  The injection was tolerated well.  A band aid dressing was applied.  The patient was advised to apply ice later today and tomorrow to the injection sight as needed.  Electronically Signed Sanjuana Kava, MD 3/19/20208:43 AM

## 2019-02-27 ENCOUNTER — Encounter: Payer: Self-pay | Admitting: Orthopaedic Surgery

## 2019-02-27 ENCOUNTER — Ambulatory Visit: Payer: PPO | Admitting: Orthopaedic Surgery

## 2019-02-27 ENCOUNTER — Other Ambulatory Visit: Payer: Self-pay

## 2019-02-27 VITALS — BP 144/89 | HR 66 | Temp 97.2°F | Ht 69.0 in | Wt 209.0 lb

## 2019-02-27 DIAGNOSIS — M25562 Pain in left knee: Secondary | ICD-10-CM

## 2019-02-27 DIAGNOSIS — G8929 Other chronic pain: Secondary | ICD-10-CM | POA: Diagnosis not present

## 2019-02-27 NOTE — Progress Notes (Signed)
CC:  I have pain of my left knee. I would like an injection.  The patient has chronic pain of the left knee.  There is no recent trauma.  There is no redness.  Injections in the past have helped.  The knee has no redness, has an effusion and crepitus present.  ROM of the left knee is 0-105.  Impression:  Chronic knee pain left  Return: as needed  PROCEDURE NOTE:  The patient requests injections of the left knee, verbal consent was obtained.  The left knee was prepped appropriately after time out was performed.   Sterile technique was observed and injection of 1 cc of Depo-Medrol 40 mg with several cc's of plain xylocaine. Anesthesia was provided by ethyl chloride and a 20-gauge needle was used to inject the knee area. The injection was tolerated well.  A band aid dressing was applied.  The patient was advised to apply ice later today and tomorrow to the injection sight as needed.  Electronically Signed Sanjuana Kava, MD 5/19/20208:37 AM

## 2019-03-06 ENCOUNTER — Other Ambulatory Visit: Payer: Self-pay | Admitting: *Deleted

## 2019-03-06 NOTE — Patient Outreach (Signed)
[facility]) Care Management  03/06/2019  SHIVAUN BILELLO [birth year] [id number]   Health Risk Assessment screening call has been completed . Patient will receive appropriate information to the [facility] care management program. Patient has received successful outreach letter and EMMI handout and Advanced directive packet mailed on 3/6.   Patient will be followed in the health team advantage, HRA engaged program.   Plan Will send patient welcome letter  Will plan return call within 6 months.   Joylene Draft, RN, [place] Management Coordinator  [phone]- Mobile [phone]- Toll Free Main Office

## 2019-03-23 DIAGNOSIS — M1990 Unspecified osteoarthritis, unspecified site: Secondary | ICD-10-CM | POA: Diagnosis not present

## 2019-03-23 DIAGNOSIS — K219 Gastro-esophageal reflux disease without esophagitis: Secondary | ICD-10-CM | POA: Diagnosis not present

## 2019-03-23 DIAGNOSIS — I10 Essential (primary) hypertension: Secondary | ICD-10-CM | POA: Diagnosis not present

## 2019-03-26 DIAGNOSIS — H25813 Combined forms of age-related cataract, bilateral: Secondary | ICD-10-CM | POA: Diagnosis not present

## 2019-03-26 DIAGNOSIS — H5213 Myopia, bilateral: Secondary | ICD-10-CM | POA: Diagnosis not present

## 2019-03-30 ENCOUNTER — Other Ambulatory Visit: Payer: Self-pay | Admitting: *Deleted

## 2019-03-30 NOTE — Patient Outreach (Signed)
[place] [facility]) Care Management  03/30/2019  Jamie Hunt 11/08/1950 [id number]   Patient  case has been transitioned to [facility] to continue Care Management services.    Joylene Draft, RN, [place] Management Coordinator  [phone]- Mobile [phone]- Toll Free Main Office

## 2019-04-22 DIAGNOSIS — M1990 Unspecified osteoarthritis, unspecified site: Secondary | ICD-10-CM | POA: Diagnosis not present

## 2019-04-22 DIAGNOSIS — I10 Essential (primary) hypertension: Secondary | ICD-10-CM | POA: Diagnosis not present

## 2019-05-09 NOTE — H&P (Signed)
Surgical History & Physical  Patient Name: Jamie Hunt DOB: Oct 14, 1950  Surgery: Cataract extraction with intraocular lens implant phacoemulsification; Right Eye  Surgeon: [name] MD Surgery Date:  05/18/2019 Pre-Op Date:  05/03/2019  HPI: A 31 Yr. old female patient 1. 1. The patient complains of difficulty when viewing TV, reading closed caption, news scrolls on TV, which began 1 year ago. Both eyes are affected. The episode is gradual. The condition's severity increased since last visit. Symptoms occur when the patient is driving and reading. This is negatively affecting the patient's quality of life. The complaint is associated with glare.Some floaters, no flashes. HPI was performed by [name] .  Medical History: Cataracts  General Health  Arthritis High Blood Pressure LDL Reflux, Shingles Allergic/Immunologic Seasonal Allergies  Review of Systems All recorded systems are negative except as noted above.  Social   Never smoked   Medication HCTZ, Hydralazine, Losartan Potassium, Lovastatin, Naproxen, Omeprazole, Verapamil,   Sx/Procedures Hysterectomy, Gallbladder Removal, Varicose veins vein ablation, Back Surgery,   Drug Allergies  Penicillin, Codeine,   History & Physical: Heent:  Cataract, Right eye NECK: supple without bruits LUNGS: lungs clear to auscultation CV: regular rate and rhythm Abdomen: soft and non-tender Impression & Plan: Assessment: 1.  COMBINED FORMS AGE RELATED CATARACT; Both Eyes (H25.813) 2.  Myopia ; Both Eyes (H52.13)  Plan: 1.  Cataract accounts for the patient's decreased vision. This visual impairment is not correctable with a tolerable change in glasses or contact lenses. Cataract surgery with an implantation of a new lens should significantly improve the visual and functional status of the patient. Discussed all risks, benefits, alternatives, and potential complications. Discussed the procedures and recovery. Patient  desires to have surgery. A-scan ordered and performed today for intra-ocular lens calculations. The surgery will be performed in order to improve vision for driving, reading, and for eye examinations. Recommend phacoemulsification with intra-ocular lens. Right Eye worse - first. Dilates well - shugarcaine by protocol. Patient is nearsighted - wants to keep it that way - goal -2.75. 2.  Explained near-sightedness to patient.

## 2019-05-11 DIAGNOSIS — H25811 Combined forms of age-related cataract, right eye: Secondary | ICD-10-CM | POA: Diagnosis not present

## 2019-05-14 NOTE — Patient Instructions (Signed)
Jamie Hunt  05/14/2019     @PREFPERIOPPHARMACY @   Your procedure is scheduled on  05/18/2019 .  Report to [facility] at  820  A.M.  Call this number if you have problems the morning of surgery:  [phone]   Remember:  Do not eat or drink after midnight.                        Take these medicines the morning of surgery with A SIP OF WATER  Prilosec, verapamil.    Do not wear jewelry, make-up or nail polish.  Do not wear lotions, powders, or perfumes, or deodorant.  Do not shave 48 hours prior to surgery.  Men may shave face and neck.  Do not bring valuables to the hospital.  [facility] is not responsible for any belongings or valuables.  Contacts, dentures or bridgework may not be worn into surgery.  Leave your suitcase in the car.  After surgery it may be brought to your room.  For patients admitted to the hospital, discharge time will be determined by your treatment team.  Patients discharged the day of surgery will not be allowed to drive home.   Name and phone number of your driver:   family Special instructions:  None  Please read over the following fact sheets that you were given. Anesthesia Post-op Instructions and Care and Recovery After Surgery       Cataract Surgery, Care After This sheet gives you information about how to care for yourself after your procedure. Your health care provider may also give you more specific instructions. If you have problems or questions, contact your health care provider. What can I expect after the procedure? After the procedure, it is common to have:  Itching.  Discomfort.  Fluid discharge.  Sensitivity to light and to touch.  Bruising in or around the eye.  Mild blurred vision. Follow these instructions at home: Eye care   Do not touch or rub your eyes.  Protect your eyes as told by your health care provider. You may be told to wear a protective eye shield or sunglasses.  Do not put a contact  lens into the affected eye or eyes until your health care provider approves.  Keep the area around your eye clean and dry: ? Avoid swimming. ? Do not allow water to hit you directly in the face while showering. ? Keep soap and shampoo out of your eyes.  Check your eye every day for signs of infection. Watch for: ? Redness, swelling, or pain. ? Fluid, blood, or pus. ? Warmth. ? A bad smell. ? Vision that is getting worse. ? Sensitivity that is getting worse. Activity  Do not drive for 24 hours if you were given a sedative during your procedure.  Avoid strenuous activities, such as playing contact sports, for as long as told by your health care provider.  Do not drive or use heavy machinery until your health care provider approves.  Do not bend or lift heavy objects. Bending increases pressure in the eye. You can walk, climb stairs, and do light household chores.  Ask your health care provider when you can return to work. If you work in a dusty environment, you may be advised to wear protective eyewear for a period of time. General instructions  Take or apply over-the-counter and prescription medicines only as told by your health care provider. This includes eye drops.  Keep all follow-up visits as told by your health care provider. This is important. Contact a health care provider if:  You have increased bruising around your eye.  You have pain that is not helped with medicine.  You have a fever.  You have redness, swelling, or pain in your eye.  You have fluid, blood, or pus coming from your incision.  Your vision gets worse.  Your sensitivity to light gets worse. Get help right away if:  You have sudden loss of vision.  You see flashes of light or spots (floaters).  You have severe eye pain.  You develop nausea or vomiting. Summary  After your procedure, it is common to have itching, discomfort, bruising, fluid discharge, or sensitivity to light.  Follow  instructions from your health care provider about caring for your eye after the procedure.  Do not rub your eye after the procedure. You may need to wear eye protection or sunglasses. Do not wear contact lenses. Keep the area around your eye clean and dry.  Avoid activities that require a lot of effort. These include playing sports and lifting heavy objects.  Contact a health care provider if you have increased bruising, pain that does not go away, or a fever. Get help right away if you suddenly lose your vision, see flashes of light or spots, or have severe pain in the eye. This information is not intended to replace advice given to you by your health care provider. Make sure you discuss any questions you have with your health care provider. Document Released: 04/16/2005 Document Revised: 03/27/2018 Document Reviewed: 03/27/2018 Elsevier Patient Education  2020 [employer] After These instructions provide you with information about caring for yourself after your procedure. Your health care provider may also give you more specific instructions. Your treatment has been planned according to current medical practices, but problems sometimes occur. Call your health care provider if you have any problems or questions after your procedure. What can I expect after the procedure? After your procedure, you may:  Feel sleepy for several hours.  Feel clumsy and have poor balance for several hours.  Feel forgetful about what happened after the procedure.  Have poor judgment for several hours.  Feel nauseous or vomit.  Have a sore throat if you had a breathing tube during the procedure. Follow these instructions at home: For at least 24 hours after the procedure:      Have a responsible adult stay with you. It is important to have someone help care for you until you are awake and alert.  Rest as needed.  Do not: ? Participate in activities in which you could  fall or become injured. ? Drive. ? Use heavy machinery. ? Drink alcohol. ? Take sleeping pills or medicines that cause drowsiness. ? Make important decisions or sign legal documents. ? Take care of children on your own. Eating and drinking  Follow the diet that is recommended by your health care provider.  If you vomit, drink water, juice, or soup when you can drink without vomiting.  Make sure you have little or no nausea before eating solid foods. General instructions  Take over-the-counter and prescription medicines only as told by your health care provider.  If you have sleep apnea, surgery and certain medicines can increase your risk for breathing problems. Follow instructions from your health care provider about wearing your sleep device: ? Anytime you are sleeping, including during daytime naps. ? While taking prescription pain  medicines, sleeping medicines, or medicines that make you drowsy.  If you smoke, do not smoke without supervision.  Keep all follow-up visits as told by your health care provider. This is important. Contact a health care provider if:  You keep feeling nauseous or you keep vomiting.  You feel light-headed.  You develop a rash.  You have a fever. Get help right away if:  You have trouble breathing. Summary  For several hours after your procedure, you may feel sleepy and have poor judgment.  Have a responsible adult stay with you for at least 24 hours or until you are awake and alert. This information is not intended to replace advice given to you by your health care provider. Make sure you discuss any questions you have with your health care provider. Document Released: 01/18/2016 Document Revised: 12/26/2017 Document Reviewed: 01/18/2016 Elsevier Patient Education  2020 [employer].

## 2019-05-15 ENCOUNTER — Other Ambulatory Visit: Payer: Self-pay

## 2019-05-15 ENCOUNTER — Encounter (HOSPITAL_COMMUNITY): Payer: Self-pay

## 2019-05-15 ENCOUNTER — Encounter (HOSPITAL_COMMUNITY)
Admission: RE | Admit: 2019-05-15 | Discharge: 2019-05-15 | Disposition: A | Discharge status: Home / Self Care | Payer: PPO | Source: Ambulatory Visit | Attending: Ophthalmology | Admitting: Ophthalmology

## 2019-05-15 ENCOUNTER — Other Ambulatory Visit (HOSPITAL_COMMUNITY)
Admission: RE | Admit: 2019-05-15 | Discharge: 2019-05-15 | Disposition: A | Discharge status: Home / Self Care | Payer: PPO | Source: Ambulatory Visit | Attending: Ophthalmology | Admitting: Ophthalmology

## 2019-05-15 DIAGNOSIS — Z79899 Other long term (current) drug therapy: Secondary | ICD-10-CM | POA: Diagnosis not present

## 2019-05-15 DIAGNOSIS — H25811 Combined forms of age-related cataract, right eye: Secondary | ICD-10-CM | POA: Diagnosis present

## 2019-05-15 DIAGNOSIS — Z20828 Contact with and (suspected) exposure to other viral communicable diseases: Secondary | ICD-10-CM | POA: Diagnosis not present

## 2019-05-15 DIAGNOSIS — Z01812 Encounter for preprocedural laboratory examination: Secondary | ICD-10-CM | POA: Insufficient documentation

## 2019-05-15 DIAGNOSIS — K219 Gastro-esophageal reflux disease without esophagitis: Secondary | ICD-10-CM | POA: Diagnosis not present

## 2019-05-15 DIAGNOSIS — I10 Essential (primary) hypertension: Secondary | ICD-10-CM | POA: Diagnosis not present

## 2019-05-15 DIAGNOSIS — Z791 Long term (current) use of non-steroidal anti-inflammatories (NSAID): Secondary | ICD-10-CM | POA: Diagnosis not present

## 2019-05-15 DIAGNOSIS — M1990 Unspecified osteoarthritis, unspecified site: Secondary | ICD-10-CM | POA: Diagnosis not present

## 2019-05-15 DIAGNOSIS — H25813 Combined forms of age-related cataract, bilateral: Secondary | ICD-10-CM | POA: Diagnosis not present

## 2019-05-15 DIAGNOSIS — H5213 Myopia, bilateral: Secondary | ICD-10-CM | POA: Diagnosis not present

## 2019-05-15 HISTORY — DX: Anemia, unspecified: D64.9

## 2019-05-15 LAB — BASIC METABOLIC PANEL
Anion gap: 9 (ref 5–15)
BUN: 9 mg/dL (ref 8–23)
CO2: 26 mmol/L (ref 22–32)
Calcium: 9.6 mg/dL (ref 8.9–10.3)
Chloride: 107 mmol/L (ref 98–111)
Creatinine, Ser: 0.98 mg/dL (ref 0.44–1.00)
GFR calc Af Amer: >60 mL/min (ref >60)
GFR calc non Af Amer: 60 mL/min — ABNORMAL LOW (ref >60)
Glucose, Bld: 88 mg/dL (ref 70–99)
Potassium: 3.7 mmol/L (ref 3.5–5.1)
Sodium: 142 mmol/L (ref 135–145)

## 2019-05-15 LAB — SARS CORONAVIRUS 2 (TAT 6-24 HRS): SARS Coronavirus 2: NEGATIVE

## 2019-05-18 ENCOUNTER — Ambulatory Visit (HOSPITAL_COMMUNITY): Payer: PPO | Admitting: Anesthesiology

## 2019-05-18 ENCOUNTER — Other Ambulatory Visit: Payer: Self-pay

## 2019-05-18 ENCOUNTER — Ambulatory Visit (HOSPITAL_COMMUNITY)
Admission: RE | Admit: 2019-05-18 | Discharge: 2019-05-18 | Disposition: A | Discharge status: Home / Self Care | Payer: PPO | Source: Ambulatory Visit | Attending: Ophthalmology | Admitting: Ophthalmology

## 2019-05-18 ENCOUNTER — Encounter (HOSPITAL_COMMUNITY)
Admission: RE | Disposition: A | Discharge status: Home / Self Care | Payer: Self-pay | Source: Ambulatory Visit | Attending: Ophthalmology

## 2019-05-18 ENCOUNTER — Encounter (HOSPITAL_COMMUNITY): Payer: Self-pay | Admitting: *Deleted

## 2019-05-18 DIAGNOSIS — K219 Gastro-esophageal reflux disease without esophagitis: Secondary | ICD-10-CM | POA: Insufficient documentation

## 2019-05-18 DIAGNOSIS — Z791 Long term (current) use of non-steroidal anti-inflammatories (NSAID): Secondary | ICD-10-CM | POA: Insufficient documentation

## 2019-05-18 DIAGNOSIS — H25811 Combined forms of age-related cataract, right eye: Secondary | ICD-10-CM | POA: Diagnosis not present

## 2019-05-18 DIAGNOSIS — H25813 Combined forms of age-related cataract, bilateral: Secondary | ICD-10-CM | POA: Diagnosis not present

## 2019-05-18 DIAGNOSIS — H5213 Myopia, bilateral: Secondary | ICD-10-CM | POA: Diagnosis not present

## 2019-05-18 DIAGNOSIS — M1990 Unspecified osteoarthritis, unspecified site: Secondary | ICD-10-CM | POA: Diagnosis not present

## 2019-05-18 DIAGNOSIS — I10 Essential (primary) hypertension: Secondary | ICD-10-CM | POA: Diagnosis not present

## 2019-05-18 DIAGNOSIS — Z79899 Other long term (current) drug therapy: Secondary | ICD-10-CM | POA: Insufficient documentation

## 2019-05-18 HISTORY — PX: CATARACT EXTRACTION W/PHACO: SHX586

## 2019-05-18 SURGERY — PHACOEMULSIFICATION, CATARACT, WITH IOL INSERTION
Anesthesia: Monitor Anesthesia Care | Site: Eye | Laterality: Right

## 2019-05-18 MED ORDER — PHENYLEPHRINE HCL 2.5 % OP SOLN
1.0000 [drp] | OPHTHALMIC | Status: AC | PRN
  Administered 2019-05-18 (×3): 1 [drp] via OPHTHALMIC

## 2019-05-18 MED ORDER — PROVISC 10 MG/ML IO SOLN
INTRAOCULAR | Status: DC | PRN
  Administered 2019-05-18: 0.85 mL via INTRAOCULAR

## 2019-05-18 MED ORDER — POVIDONE-IODINE 5 % OP SOLN
OPHTHALMIC | Status: DC | PRN
  Administered 2019-05-18: 1 via OPHTHALMIC

## 2019-05-18 MED ORDER — MIDAZOLAM HCL 2 MG/2ML IJ SOLN
INTRAMUSCULAR | Status: DC | PRN
  Administered 2019-05-18: 2 mg via INTRAVENOUS

## 2019-05-18 MED ORDER — MIDAZOLAM HCL 2 MG/2ML IJ SOLN
INTRAMUSCULAR | Status: AC
  Filled 2019-05-18: qty 2

## 2019-05-18 MED ORDER — TETRACAINE HCL 0.5 % OP SOLN
1.0000 [drp] | OPHTHALMIC | Status: AC | PRN
  Administered 2019-05-18 (×3): 1 [drp] via OPHTHALMIC

## 2019-05-18 MED ORDER — EPINEPHRINE PF 1 MG/ML IJ SOLN
INTRAOCULAR | Status: DC | PRN
  Administered 2019-05-18: 500 mL

## 2019-05-18 MED ORDER — SODIUM HYALURONATE 23 MG/ML IO SOLN
INTRAOCULAR | Status: DC | PRN
  Administered 2019-05-18: 0.6 mL via INTRAOCULAR

## 2019-05-18 MED ORDER — ONDANSETRON HCL 4 MG/2ML IJ SOLN
INTRAMUSCULAR | Status: AC
  Filled 2019-05-18: qty 2

## 2019-05-18 MED ORDER — KETOROLAC TROMETHAMINE 0.5 % OP SOLN: 1.0000 [drp] | OPHTHALMIC | Status: DC | PRN

## 2019-05-18 MED ORDER — LIDOCAINE HCL 3.5 % OP GEL
1.0000 "application " | Freq: Once | OPHTHALMIC | Status: AC
  Administered 2019-05-18: 1 via OPHTHALMIC

## 2019-05-18 MED ORDER — CYCLOPENTOLATE-PHENYLEPHRINE 0.2-1 % OP SOLN
1.0000 [drp] | OPHTHALMIC | Status: AC | PRN
  Administered 2019-05-18 (×3): 1 [drp] via OPHTHALMIC

## 2019-05-18 MED ORDER — ONDANSETRON HCL 4 MG/2ML IJ SOLN
INTRAMUSCULAR | Status: DC | PRN
  Administered 2019-05-18: 4 mg via INTRAVENOUS

## 2019-05-18 MED ORDER — NEOMYCIN-POLYMYXIN-DEXAMETH 3.5-10000-0.1 OP SUSP
OPHTHALMIC | Status: DC | PRN
  Administered 2019-05-18: 2 [drp] via OPHTHALMIC

## 2019-05-18 MED ORDER — LIDOCAINE HCL (PF) 1 % IJ SOLN
INTRAOCULAR | Status: DC | PRN
  Administered 2019-05-18: 1 mL via OPHTHALMIC

## 2019-05-18 MED ORDER — BSS IO SOLN
INTRAOCULAR | Status: DC | PRN
  Administered 2019-05-18: 15 mL via INTRAOCULAR

## 2019-05-18 MED ORDER — SODIUM CHLORIDE 0.9% FLUSH
10.0000 mL | INTRAVENOUS | Status: DC | PRN
  Administered 2019-05-18: 3 mL via INTRAVENOUS

## 2019-05-18 SURGICAL SUPPLY — 13 items

## 2019-05-18 NOTE — Op Note (Signed)
Date of procedure: 05/18/19  Pre-operative diagnosis: Visually significant age-related cataract, Right Eye (H25.811)  Post-operative diagnosis: Visually significant age-related cataract, Right Eye  Procedure: Removal of cataract via phacoemulsification and insertion of intra-ocular lens Wynetta Emery and [employer] PCB00  +16.5D into the capsular bag of the Right Eye  Attending surgeon: Gerda Diss. Josephyne Tarter, MD, MA  Anesthesia: MAC, Topical Ak[place]  Complications: None  Estimated Blood Loss: <36m (minimal)  Specimens: None  Implants: As above  Indications:  Visually significant age-related cataract, Right Eye  Procedure:  The patient was seen and identified in the pre-operative area. The operative eye was identified and dilated.  The operative eye was marked.  Topical anesthesia was administered to the operative eye.     The patient was then to the operative suite and placed in the supine position.  A timeout was performed confirming the patient, procedure to be performed, and all other relevant information.   The patient's face was prepped and draped in the usual fashion for intra-ocular surgery.  A lid speculum was placed into the operative eye and the surgical microscope moved into place and focused.  A superotemporal paracentesis was created using a 20 gauge paracentesis blade.  Shugarcaine was injected into the anterior chamber.  Viscoelastic was injected into the anterior chamber.  A temporal clear-corneal main wound incision was created using a 2.452mmicrokeratome.  A continuous curvilinear capsulorrhexis was initiated using an irrigating cystitome and completed using capsulorrhexis forceps.  Hydrodissection and hydrodeliniation were performed.  Viscoelastic was injected into the anterior chamber.  A phacoemulsification handpiece and a chopper as a second instrument were used to remove the nucleus and epinucleus. The irrigation/aspiration handpiece was used to remove any remaining cortical  material.   The capsular bag was reinflated with viscoelastic, checked, and found to be intact.  The intraocular lens was inserted into the capsular bag and dialed into place using a Kuglen hook.  The irrigation/aspiration handpiece was used to remove any remaining viscoelastic.  The clear corneal wound and paracentesis wounds were then hydrated and checked with Weck-Cels to be watertight.  The lid-speculum and drape was removed, and the patient's face was cleaned with a wet and dry 4x4.  Maxitrol was instilled in the eye before a clear shield was taped over the eye. The patient was taken to the post-operative care unit in good condition, having tolerated the procedure well.  Post-Op Instructions: [facility] and will receive all other orders and instructions.

## 2019-05-18 NOTE — Interval H&P Note (Signed)
History and Physical Interval Note: The H and P was reviewed and updated. The patient was examined.  No changes were found after exam.  The surgical eye was marked.  05/18/2019 9:49 AM  Jamie Hunt  has presented today for surgery, with the diagnosis of nuclear cataract right eye.  The various methods of treatment have been discussed with the patient and family. After consideration of risks, benefits and other options for treatment, the patient has consented to  Procedure(s) with comments: CATARACT EXTRACTION PHACO AND INTRAOCULAR LENS PLACEMENT (IOC) (Right) - CDE:  as a surgical intervention.  The patient's history has been reviewed, patient examined, no change in status, stable for surgery.  I have reviewed the patient's chart and labs.  Questions were answered to the patient's satisfaction.     [name]

## 2019-05-18 NOTE — Anesthesia Postprocedure Evaluation (Signed)
Anesthesia Post Note  Patient: Jamie Hunt  Procedure(s) Performed: CATARACT EXTRACTION PHACO AND INTRAOCULAR LENS PLACEMENT (IOC) (Right Eye)  Patient location during evaluation: Short Stay Anesthesia Type: MAC Level of consciousness: awake and alert, patient cooperative and oriented Pain management: pain level controlled Vital Signs Assessment: post-procedure vital signs reviewed and stable Respiratory status: spontaneous breathing Cardiovascular status: stable Postop Assessment: no apparent nausea or vomiting Anesthetic complications: no     Last Vitals:  Vitals:   05/18/19 0917  BP: 140/81  Pulse: 72  Resp: 18  Temp: 36.8 C  SpO2: 97%    Last Pain:  Vitals:   05/18/19 0917  TempSrc: Oral  PainSc: 0-No pain                 Eduin Friedel

## 2019-05-18 NOTE — Transfer of Care (Signed)
Immediate Anesthesia Transfer of Care Note  Patient: Jamie Hunt  Procedure(s) Performed: CATARACT EXTRACTION PHACO AND INTRAOCULAR LENS PLACEMENT (IOC) (Right Eye)  Patient Location: Short Stay  Anesthesia Type:MAC  Level of Consciousness: awake, alert  and patient cooperative  Airway & Oxygen Therapy: Patient Spontanous Breathing  Post-op Assessment: Report given to RN and Post -op Vital signs reviewed and stable  Post vital signs: Reviewed and stable  Last Vitals:  Vitals Value Taken Time  BP 133/76   Temp 97.9   Pulse 77   Resp 18   SpO2 95     Last Pain:  Vitals:   05/18/19 0917  TempSrc: Oral  PainSc: 0-No pain         Complications: No apparent anesthesia complications

## 2019-05-18 NOTE — Anesthesia Preprocedure Evaluation (Signed)
Anesthesia Evaluation  Patient identified by MRN, date of birth, ID band Patient awake    Reviewed: Allergy & Precautions, NPO status , Patient's Chart, lab work & pertinent test results  History of Anesthesia Complications (+) PONV and history of anesthetic complications  Airway Mallampati: II  TM Distance: >3 FB Neck ROM: Full    Dental  (+) Missing,    Pulmonary neg pulmonary ROS,    Pulmonary exam normal        Cardiovascular Exercise Tolerance: Good hypertension, Pt. on medications Normal cardiovascular exam     Neuro/Psych negative neurological ROS  negative psych ROS   GI/Hepatic Neg liver ROS, GERD  ,  Endo/Other  negative endocrine ROS  Renal/GU negative Renal ROS     Musculoskeletal  (+) Arthritis ,   Abdominal   Peds  Hematology  (+) anemia ,   Anesthesia Other Findings   Reproductive/Obstetrics                             Anesthesia Physical Anesthesia Plan  ASA: II  Anesthesia Plan: MAC   Post-op Pain Management:    Induction:   PONV Risk Score and Plan:   Airway Management Planned: Nasal Cannula  Additional Equipment:   Intra-op Plan:   Post-operative Plan:   Informed Consent: I have reviewed the patients History and Physical, chart, labs and discussed the procedure including the risks, benefits and alternatives for the proposed anesthesia with the patient or authorized representative who has indicated his/her understanding and acceptance.     Dental advisory given  Plan Discussed with: CRNA  Anesthesia Plan Comments:         Anesthesia Quick Evaluation

## 2019-05-18 NOTE — Discharge Instructions (Addendum)
Monitored Anesthesia Care, Care After °These instructions provide you with information about caring for yourself after your procedure. Your health care provider may also give you more specific instructions. Your treatment has been planned according to current medical practices, but problems sometimes occur. Call your health care provider if you have any problems or questions after your procedure. °What can I expect after the procedure? °After your procedure, you may: °· Feel sleepy for several hours. °· Feel clumsy and have poor balance for several hours. °· Feel forgetful about what happened after the procedure. °· Have poor judgment for several hours. °· Feel nauseous or vomit. °· Have a sore throat if you had a breathing tube during the procedure. °Follow these instructions at home: °For at least 24 hours after the procedure: ° °  ° °· Have a responsible adult stay with you. It is important to have someone help care for you until you are awake and alert. °· Rest as needed. °· Do not: °? Participate in activities in which you could fall or become injured. °? Drive. °? Use heavy machinery. °? Drink alcohol. °? Take sleeping pills or medicines that cause drowsiness. °? Make important decisions or sign legal documents. °? Take care of children on your own. °Eating and drinking °· Follow the diet that is recommended by your health care provider. °· If you vomit, drink water, juice, or soup when you can drink without vomiting. °· Make sure you have little or no nausea before eating solid foods. °General instructions °· Take over-the-counter and prescription medicines only as told by your health care provider. °· If you have sleep apnea, surgery and certain medicines can increase your risk for breathing problems. Follow instructions from your health care provider about wearing your sleep device: °? Anytime you are sleeping, including during daytime naps. °? While taking prescription pain medicines, sleeping medicines,  or medicines that make you drowsy. °· If you smoke, do not smoke without supervision. °· Keep all follow-up visits as told by your health care provider. This is important. °Contact a health care provider if: °· You keep feeling nauseous or you keep vomiting. °· You feel light-headed. °· You develop a rash. °· You have a fever. °Get help right away if: °· You have trouble breathing. °Summary °· For several hours after your procedure, you may feel sleepy and have poor judgment. °· Have a responsible adult stay with you for at least 24 hours or until you are awake and alert. °This information is not intended to replace advice given to you by your health care provider. Make sure you discuss any questions you have with your health care provider. °Document Released: 01/18/2016 Document Revised: 12/26/2017 Document Reviewed: 01/18/2016 °Elsevier Patient Education © 2020 [employer] °Please discharge patient when stable, will follow up today with Dr. Wrzosek at the [facility] office immediately following discharge.  Leave shield in place until visit.  All paperwork with discharge instructions will be given at the office. ° °

## 2019-05-21 ENCOUNTER — Encounter (HOSPITAL_COMMUNITY): Payer: Self-pay | Admitting: Ophthalmology

## 2019-05-23 DIAGNOSIS — K219 Gastro-esophageal reflux disease without esophagitis: Secondary | ICD-10-CM | POA: Diagnosis not present

## 2019-05-23 DIAGNOSIS — I10 Essential (primary) hypertension: Secondary | ICD-10-CM | POA: Diagnosis not present

## 2019-05-25 DIAGNOSIS — H25812 Combined forms of age-related cataract, left eye: Secondary | ICD-10-CM | POA: Diagnosis not present

## 2019-05-25 NOTE — H&P (Signed)
Surgical History & Physical  Patient Name: Jamie Hunt DOB: Nov 02, 1950  Surgery: Cataract extraction with intraocular lens implant phacoemulsification; Left Eye  Surgeon: [name] MD Surgery Date:  06/01/2019 Pre-Op Date:  05/24/2019  HPI: A 20 Yr. old female patient 1. The patient is returning after cataract surgery. The right eye is affected. Status post cataract surgery: Onset was S/P 1 week. Since the last visit, the affected area feels improvement; she is not seeing as many floaters as she first was, she says VA in OD is very good now . Patient is following medication instructions; Omni gtts TID OD. Pt complains of OS VA, she says that she does not drive just yet because of the [place] in OS, she is not able to read the signs properly, this is This is negatively affecting the patient's quality of life. HPI was performed by [name] .  Medical History: Cataracts Arthritis High Blood Pressure LDL Reflux, Shingles  Review of Systems Allergic/Immunologic Seasonal Allergies All recorded systems are negative except as noted above.  Social   Never smoked   Medication Prednisolon-gatiflox-bromfenac,  HCTZ, Hydralazine, Losartan Potassium, Lovastatin, Naproxen, Omeprazole, Verapamil,   Sx/Procedures Phaco c IOL,  Hysterectomy, Gallbladder Removal, Varicose veins vein ablation, Back Surgery,   Drug Allergies  Penicillin, Codeine,   History & Physical: Heent:  Cataract, Left eye NECK: supple without bruits LUNGS: lungs clear to auscultation CV: regular rate and rhythm Abdomen: soft and non-tender  Impression & Plan: Assessment: 1.  CATARACT EXTRACTION STATUS; Right Eye (Z98.41) 2.  COMBINED FORMS AGE RELATED CATARACT; Left Eye (H25.812) 3.  INTRAOCULAR LENS IOL (Z96.1)  Plan: 1.  1 week after cataract surgery. Doing well with improved vision and normal eye pressure. Call with any problems or concerns. Continue Gati-Brom-Pred 2x/day for 3 more weeks. 2.  Dilates  well - shugarcaine by protocol. Patient is nearsighted - wants to keep it that way - goal -2.75. Cataract accounts for the patient's decreased vision. This visual impairment is not correctable with a tolerable change in glasses or contact lenses. Cataract surgery with an implantation of a new lens should significantly improve the visual and functional status of the patient. Discussed all risks, benefits, alternatives, and potential complications. Discussed the procedures and recovery. Patient desires to have surgery. A-scan ordered and performed today for intra-ocular lens calculations. The surgery will be performed in order to improve vision for driving, reading, and for eye examinations. Recommend phacoemulsification with intra-ocular lens. Left Eye. Surgery required to correct imbalance of vision. Patient is near sighted - goal -2.75. 3.  Stable.

## 2019-05-29 ENCOUNTER — Encounter (HOSPITAL_COMMUNITY)
Admission: RE | Admit: 2019-05-29 | Discharge: 2019-05-29 | Disposition: A | Discharge status: Home / Self Care | Payer: PPO | Source: Ambulatory Visit | Attending: Ophthalmology | Admitting: Ophthalmology

## 2019-05-29 ENCOUNTER — Other Ambulatory Visit: Payer: Self-pay

## 2019-05-29 ENCOUNTER — Other Ambulatory Visit (HOSPITAL_COMMUNITY)
Admission: RE | Admit: 2019-05-29 | Discharge: 2019-05-29 | Disposition: A | Discharge status: Home / Self Care | Payer: PPO | Source: Ambulatory Visit | Attending: Ophthalmology | Admitting: Ophthalmology

## 2019-05-29 DIAGNOSIS — H2512 Age-related nuclear cataract, left eye: Secondary | ICD-10-CM | POA: Diagnosis not present

## 2019-05-29 DIAGNOSIS — Z20828 Contact with and (suspected) exposure to other viral communicable diseases: Secondary | ICD-10-CM | POA: Insufficient documentation

## 2019-05-29 DIAGNOSIS — Z01812 Encounter for preprocedural laboratory examination: Secondary | ICD-10-CM | POA: Diagnosis not present

## 2019-05-29 LAB — SARS CORONAVIRUS 2 (TAT 6-24 HRS): SARS Coronavirus 2: NEGATIVE

## 2019-06-01 ENCOUNTER — Encounter (HOSPITAL_COMMUNITY)
Admission: RE | Disposition: A | Discharge status: Home / Self Care | Payer: Self-pay | Source: Ambulatory Visit | Attending: Ophthalmology

## 2019-06-01 ENCOUNTER — Ambulatory Visit (HOSPITAL_COMMUNITY)
Admission: RE | Admit: 2019-06-01 | Discharge: 2019-06-01 | Disposition: A | Discharge status: Home / Self Care | Payer: PPO | Source: Ambulatory Visit | Attending: Ophthalmology | Admitting: Ophthalmology

## 2019-06-01 ENCOUNTER — Encounter (HOSPITAL_COMMUNITY): Payer: Self-pay | Admitting: *Deleted

## 2019-06-01 ENCOUNTER — Ambulatory Visit (HOSPITAL_COMMUNITY): Payer: PPO | Admitting: Anesthesiology

## 2019-06-01 ENCOUNTER — Other Ambulatory Visit: Payer: Self-pay

## 2019-06-01 DIAGNOSIS — Z79899 Other long term (current) drug therapy: Secondary | ICD-10-CM | POA: Diagnosis not present

## 2019-06-01 DIAGNOSIS — H2512 Age-related nuclear cataract, left eye: Secondary | ICD-10-CM | POA: Diagnosis not present

## 2019-06-01 DIAGNOSIS — R030 Elevated blood-pressure reading, without diagnosis of hypertension: Secondary | ICD-10-CM | POA: Insufficient documentation

## 2019-06-01 DIAGNOSIS — Z9841 Cataract extraction status, right eye: Secondary | ICD-10-CM | POA: Diagnosis not present

## 2019-06-01 DIAGNOSIS — K219 Gastro-esophageal reflux disease without esophagitis: Secondary | ICD-10-CM | POA: Diagnosis not present

## 2019-06-01 DIAGNOSIS — H25812 Combined forms of age-related cataract, left eye: Secondary | ICD-10-CM | POA: Insufficient documentation

## 2019-06-01 HISTORY — PX: CATARACT EXTRACTION W/PHACO: SHX586

## 2019-06-01 SURGERY — PHACOEMULSIFICATION, CATARACT, WITH IOL INSERTION
Anesthesia: Monitor Anesthesia Care | Site: Eye | Laterality: Left

## 2019-06-01 MED ORDER — ONDANSETRON HCL 4 MG/2ML IJ SOLN
INTRAMUSCULAR | Status: DC | PRN
  Administered 2019-06-01: 4 mg via INTRAVENOUS

## 2019-06-01 MED ORDER — ONDANSETRON HCL 4 MG/2ML IJ SOLN
INTRAMUSCULAR | Status: AC
  Filled 2019-06-01: qty 2

## 2019-06-01 MED ORDER — LACTATED RINGERS IV SOLN: INTRAVENOUS | Status: DC

## 2019-06-01 MED ORDER — LIDOCAINE HCL 3.5 % OP GEL
1.0000 "application " | Freq: Once | OPHTHALMIC | Status: AC
  Administered 2019-06-01: 1 via OPHTHALMIC

## 2019-06-01 MED ORDER — POVIDONE-IODINE 5 % OP SOLN
OPHTHALMIC | Status: DC | PRN
  Administered 2019-06-01: 1 via OPHTHALMIC

## 2019-06-01 MED ORDER — LIDOCAINE HCL (PF) 1 % IJ SOLN
INTRAOCULAR | Status: DC | PRN
  Administered 2019-06-01: 1 mL via OPHTHALMIC

## 2019-06-01 MED ORDER — MIDAZOLAM HCL 2 MG/2ML IJ SOLN
INTRAMUSCULAR | Status: AC
  Filled 2019-06-01: qty 2

## 2019-06-01 MED ORDER — TETRACAINE HCL 0.5 % OP SOLN
1.0000 [drp] | OPHTHALMIC | Status: AC | PRN
  Administered 2019-06-01 (×3): 1 [drp] via OPHTHALMIC

## 2019-06-01 MED ORDER — NEOMYCIN-POLYMYXIN-DEXAMETH 3.5-10000-0.1 OP SUSP
OPHTHALMIC | Status: DC | PRN
  Administered 2019-06-01: 1 [drp] via OPHTHALMIC

## 2019-06-01 MED ORDER — MIDAZOLAM HCL 2 MG/2ML IJ SOLN
INTRAMUSCULAR | Status: DC | PRN
  Administered 2019-06-01: 2 mg via INTRAVENOUS

## 2019-06-01 MED ORDER — EPINEPHRINE PF 1 MG/ML IJ SOLN
INTRAOCULAR | Status: DC | PRN
  Administered 2019-06-01: 12:00:00 500 mL

## 2019-06-01 MED ORDER — SODIUM HYALURONATE 23 MG/ML IO SOLN
INTRAOCULAR | Status: DC | PRN
  Administered 2019-06-01: 0.6 mL via INTRAOCULAR

## 2019-06-01 MED ORDER — BSS IO SOLN
INTRAOCULAR | Status: DC | PRN
  Administered 2019-06-01: 15 mL

## 2019-06-01 MED ORDER — PHENYLEPHRINE HCL 2.5 % OP SOLN
1.0000 [drp] | OPHTHALMIC | Status: AC | PRN
  Administered 2019-06-01 (×3): 1 [drp] via OPHTHALMIC

## 2019-06-01 MED ORDER — SODIUM CHLORIDE 0.9% FLUSH
10.0000 mL | INTRAVENOUS | Status: DC | PRN
  Administered 2019-06-01: 12:00:00 10 mL via INTRAVENOUS
  Filled 2019-06-01: qty 10

## 2019-06-01 MED ORDER — PROVISC 10 MG/ML IO SOLN
INTRAOCULAR | Status: DC | PRN
  Administered 2019-06-01: 0.85 mL via INTRAOCULAR

## 2019-06-01 MED ORDER — CYCLOPENTOLATE-PHENYLEPHRINE 0.2-1 % OP SOLN
1.0000 [drp] | OPHTHALMIC | Status: AC | PRN
  Administered 2019-06-01 (×3): 1 [drp] via OPHTHALMIC

## 2019-06-01 SURGICAL SUPPLY — 15 items
CLOTH BEACON ORANGE TIMEOUT ST (SAFETY) ×3 IMPLANT
EYE SHIELD UNIVERSAL CLEAR (GAUZE/BANDAGES/DRESSINGS) ×2 IMPLANT
GLOVE BIOGEL PI IND STRL 6.5 (GLOVE) ×1 IMPLANT
GLOVE BIOGEL PI IND STRL 7.0 (GLOVE) IMPLANT
GLOVE BIOGEL PI INDICATOR 6.5 (GLOVE) ×2
GLOVE BIOGEL PI INDICATOR 7.0 (GLOVE) ×2
LENS ALC ACRYL/TECN (Ophthalmic Related) ×3 IMPLANT
NDL HYPO 18GX1.5 BLUNT FILL (NEEDLE) IMPLANT
NEEDLE HYPO 18GX1.5 BLUNT FILL (NEEDLE) ×3 IMPLANT
PAD ARMBOARD 7.5X6 YLW CONV (MISCELLANEOUS) ×2 IMPLANT
SYR TB 1ML LL NO SAFETY (SYRINGE) ×2 IMPLANT
TAPE SURG TRANSPORE 1 IN (GAUZE/BANDAGES/DRESSINGS) IMPLANT
TAPE SURGICAL TRANSPORE 1 IN (GAUZE/BANDAGES/DRESSINGS) ×2
VISCOELASTIC ADDITIONAL (OPHTHALMIC RELATED) ×2 IMPLANT
WATER STERILE IRR 250ML POUR (IV SOLUTION) ×2 IMPLANT

## 2019-06-01 NOTE — Anesthesia Postprocedure Evaluation (Signed)
Anesthesia Post Note  Patient: Jamie Hunt  Procedure(s) Performed: CATARACT EXTRACTION PHACO AND INTRAOCULAR LENS PLACEMENT ([place]) (Left Eye)  Patient location during evaluation: Short Stay Anesthesia Type: MAC Level of consciousness: awake and alert Pain management: pain level controlled Vital Signs Assessment: post-procedure vital signs reviewed and stable Respiratory status: spontaneous breathing Cardiovascular status: stable Postop Assessment: no apparent nausea or vomiting Anesthetic complications: no     Last Vitals:  Vitals:   06/01/19 1040 06/01/19 1149  BP: 134/79 140/89  Pulse:  85  Resp: 18 18  Temp: 36.8 C 36.8 C  SpO2: 96% 96%    Last Pain:  Vitals:   06/01/19 1149  TempSrc: Oral  PainSc: 0-No pain                 Chadrick Sprinkle

## 2019-06-01 NOTE — Anesthesia Preprocedure Evaluation (Signed)
Anesthesia Evaluation  Patient identified by MRN, date of birth, ID band Patient awake    Reviewed: Allergy & Precautions, NPO status , Patient's Chart, lab work & pertinent test results  History of Anesthesia Complications (+) PONV and history of anesthetic complications  Airway Mallampati: II  TM Distance: >3 FB Neck ROM: Full    Dental no notable dental hx. (+) Teeth Intact   Pulmonary neg pulmonary ROS,    Pulmonary exam normal breath sounds clear to auscultation       Cardiovascular Exercise Tolerance: Good hypertension, Pt. on medications negative cardio ROS Normal cardiovascular examI Rhythm:Regular Rate:Normal     Neuro/Psych negative neurological ROS  negative psych ROS   GI/Hepatic Neg liver ROS, GERD  Medicated and Controlled,  Endo/Other  negative endocrine ROS  Renal/GU negative Renal ROS  negative genitourinary   Musculoskeletal negative musculoskeletal ROS (+)   Abdominal   Peds negative pediatric ROS (+)  Hematology negative hematology ROS (+) anemia ,   Anesthesia Other Findings   Reproductive/Obstetrics negative OB ROS                             Anesthesia Physical Anesthesia Plan  ASA: II  Anesthesia Plan: MAC   Post-op Pain Management:    Induction: Intravenous  PONV Risk Score and Plan: Ondansetron, Treatment may vary due to age or medical condition and TIVA  Airway Management Planned: Nasal Cannula  Additional Equipment:   Intra-op Plan:   Post-operative Plan:   Informed Consent: I have reviewed the patients History and Physical, chart, labs and discussed the procedure including the risks, benefits and alternatives for the proposed anesthesia with the patient or authorized representative who has indicated his/her understanding and acceptance.     Dental advisory given  Plan Discussed with: CRNA  Anesthesia Plan Comments: (Plan Full PPE   Plan MAC as with prior IOL on 05/18/2019 Requests preop Zofran prior to OR)        Anesthesia Quick Evaluation

## 2019-06-01 NOTE — Anesthesia Procedure Notes (Signed)
Procedure Name: MAC Date/Time: 06/01/2019 11:28 AM Performed by: Vista Deck, CRNA Pre-anesthesia Checklist: Patient identified, Emergency Drugs available, Suction available, Timeout performed and Patient being monitored Patient Re-evaluated:Patient Re-evaluated prior to induction Oxygen Delivery Method: Non-rebreather mask

## 2019-06-01 NOTE — Interval H&P Note (Signed)
History and Physical Interval Note: The H and P was reviewed and updated. The patient was examined.  No changes were found after exam.  The surgical eye was marked.  06/01/2019 11:24 AM  Jamie Hunt  has presented today for surgery, with the diagnosis of Nuclear sclerotic cataract - Left eye.  The various methods of treatment have been discussed with the patient and family. After consideration of risks, benefits and other options for treatment, the patient has consented to  Procedure(s) with comments: CATARACT EXTRACTION PHACO AND INTRAOCULAR LENS PLACEMENT ([place]) (Left) - left as a surgical intervention.  The patient's history has been reviewed, patient examined, no change in status, stable for surgery.  I have reviewed the patient's chart and labs.  Questions were answered to the patient's satisfaction.     [name]

## 2019-06-01 NOTE — Transfer of Care (Addendum)
Immediate Anesthesia Transfer of Care Note  Patient: Jamie Hunt  Procedure(s) Performed: CATARACT EXTRACTION PHACO AND INTRAOCULAR LENS PLACEMENT ([facility]) (Left Eye)  Patient Location: Short Stay  Anesthesia Type:MAC  Level of Consciousness: awake, alert  and patient cooperative  Airway & Oxygen Therapy: Patient Spontanous Breathing  Post-op Assessment: Report given to RN and Post -op Vital signs reviewed and stable  Post vital signs: Reviewed and stable  Last Vitals:  Vitals Value Taken Time  BP    Temp    Pulse    Resp    SpO2     See pacu flow sheet for vital signs Last Pain:  Vitals:   06/01/19 1040  TempSrc: Oral  PainSc: 0-No pain      Patients Stated Pain Goal: 8 (97/67/34 1937)  Complications: No apparent anesthesia complications

## 2019-06-01 NOTE — Discharge Instructions (Signed)
Please discharge patient when stable, will follow up today with Dr. Marabeth Melland at the [facility] office immediately following discharge.  Leave shield in place until visit.  All paperwork with discharge instructions will be given at the office. ° °

## 2019-06-01 NOTE — Op Note (Signed)
Date of procedure: 06/01/19  Pre-operative diagnosis: Visually significant age-related cataract, Left Eye (H25.812)  Post-operative diagnosis: Visually significant age-related cataract, Left Eye  Procedure: Removal of cataract via phacoemulsification and insertion of intra-ocular lens [place]  +17.5D into the capsular bag of the Left Eye  Attending surgeon: Gerda Diss. Jaquarius Seder, MD, MA  Anesthesia: MAC, Topical [place]  Complications: None  Estimated Blood Loss: <63m (minimal)  Specimens: None  Implants: As above  Indications:  Visually significant age-related cataract, Left Eye  Procedure:  The patient was seen and identified in the pre-operative area. The operative eye was identified and dilated.  The operative eye was marked.  Topical anesthesia was administered to the operative eye.     The patient was then to the operative suite and placed in the supine position.  A timeout was performed confirming the patient, procedure to be performed, and all other relevant information.   The patient's face was prepped and draped in the usual fashion for intra-ocular surgery.  A lid speculum was placed into the operative eye and the surgical microscope moved into place and focused.  An inferotemporal paracentesis was created using a 20 gauge paracentesis blade.  Shugarcaine was injected into the anterior chamber.  Viscoelastic was injected into the anterior chamber.  A temporal clear-corneal main wound incision was created using a 2.435mmicrokeratome.  A continuous curvilinear capsulorrhexis was initiated using an irrigating cystitome and completed using capsulorrhexis forceps.  Hydrodissection and hydrodeliniation were performed.  Viscoelastic was injected into the anterior chamber.  A phacoemulsification handpiece and a chopper as a second instrument were used to remove the nucleus and epinucleus. The irrigation/aspiration handpiece was used to remove any remaining cortical  material.   The capsular bag was reinflated with viscoelastic, checked, and found to be intact.  The intraocular lens was inserted into the capsular bag.  The irrigation/aspiration handpiece was used to remove any remaining viscoelastic.  The clear corneal wound and paracentesis wounds were then hydrated and checked with Weck-Cels to be watertight.  The lid-speculum and drape was removed, and the patient's face was cleaned with a wet and dry 4x4.  Maxitrol was instilled in the eye before a clear shield was taped over the eye. The patient was taken to the post-operative care unit in good condition, having tolerated the procedure well.  Post-Op Instructions: [facility] and will receive all other orders and instructions.

## 2019-06-04 ENCOUNTER — Encounter (HOSPITAL_COMMUNITY): Payer: Self-pay | Admitting: Ophthalmology

## 2019-06-23 DIAGNOSIS — I10 Essential (primary) hypertension: Secondary | ICD-10-CM | POA: Diagnosis not present

## 2019-06-23 DIAGNOSIS — K219 Gastro-esophageal reflux disease without esophagitis: Secondary | ICD-10-CM | POA: Diagnosis not present

## 2019-06-29 ENCOUNTER — Ambulatory Visit: Payer: PPO | Admitting: *Deleted

## 2019-07-23 DIAGNOSIS — I10 Essential (primary) hypertension: Secondary | ICD-10-CM | POA: Diagnosis not present

## 2019-07-23 DIAGNOSIS — K219 Gastro-esophageal reflux disease without esophagitis: Secondary | ICD-10-CM | POA: Diagnosis not present

## 2019-08-17 DIAGNOSIS — Z1389 Encounter for screening for other disorder: Secondary | ICD-10-CM | POA: Diagnosis not present

## 2019-08-17 DIAGNOSIS — I10 Essential (primary) hypertension: Secondary | ICD-10-CM | POA: Diagnosis not present

## 2019-08-17 DIAGNOSIS — M1990 Unspecified osteoarthritis, unspecified site: Secondary | ICD-10-CM | POA: Diagnosis not present

## 2019-08-17 DIAGNOSIS — Z0001 Encounter for general adult medical examination with abnormal findings: Secondary | ICD-10-CM | POA: Diagnosis not present

## 2019-08-17 DIAGNOSIS — Z1331 Encounter for screening for depression: Secondary | ICD-10-CM | POA: Diagnosis not present

## 2019-08-17 DIAGNOSIS — K219 Gastro-esophageal reflux disease without esophagitis: Secondary | ICD-10-CM | POA: Diagnosis not present

## 2019-08-20 ENCOUNTER — Other Ambulatory Visit (HOSPITAL_COMMUNITY): Payer: Self-pay | Admitting: Internal Medicine

## 2019-08-20 DIAGNOSIS — Z780 Asymptomatic menopausal state: Secondary | ICD-10-CM

## 2019-09-16 DIAGNOSIS — I10 Essential (primary) hypertension: Secondary | ICD-10-CM | POA: Diagnosis not present

## 2019-09-16 DIAGNOSIS — K219 Gastro-esophageal reflux disease without esophagitis: Secondary | ICD-10-CM | POA: Diagnosis not present

## 2019-10-17 DIAGNOSIS — K219 Gastro-esophageal reflux disease without esophagitis: Secondary | ICD-10-CM | POA: Diagnosis not present

## 2019-10-17 DIAGNOSIS — I10 Essential (primary) hypertension: Secondary | ICD-10-CM | POA: Diagnosis not present

## 2019-11-05 ENCOUNTER — Other Ambulatory Visit (HOSPITAL_COMMUNITY): Payer: Self-pay | Admitting: Internal Medicine

## 2019-11-05 DIAGNOSIS — Z1231 Encounter for screening mammogram for malignant neoplasm of breast: Secondary | ICD-10-CM

## 2019-11-17 ENCOUNTER — Ambulatory Visit: Payer: PPO | Attending: Internal Medicine

## 2019-11-17 DIAGNOSIS — I10 Essential (primary) hypertension: Secondary | ICD-10-CM | POA: Diagnosis not present

## 2019-11-17 DIAGNOSIS — Z23 Encounter for immunization: Secondary | ICD-10-CM | POA: Insufficient documentation

## 2019-11-17 DIAGNOSIS — K219 Gastro-esophageal reflux disease without esophagitis: Secondary | ICD-10-CM | POA: Diagnosis not present

## 2019-11-17 NOTE — Progress Notes (Signed)
   Covid-19 Vaccination Clinic  Name:  Jamie Hunt    MRN: [id number] DOB: 27-Dec-1950  11/17/2019  [name] was observed post Covid-19 immunization for 15 minutes without incidence. She was provided with Vaccine Information Sheet and instruction to access the V-Safe system.   [name] was instructed to call 911 with any severe reactions post vaccine: Marland Kitchen Difficulty breathing  . Swelling of your face and throat  . A fast heartbeat  . A bad rash all over your body  . Dizziness and weakness    Immunizations Administered    Name Date Dose VIS Date Route   Moderna COVID-19 Vaccine 11/17/2019 11:04 AM 0.5 mL 09/11/2019 Intramuscular   Manufacturer: Moderna   Lot: [id number]   [id number]

## 2019-12-15 DIAGNOSIS — J329 Chronic sinusitis, unspecified: Secondary | ICD-10-CM | POA: Diagnosis not present

## 2019-12-15 DIAGNOSIS — K219 Gastro-esophageal reflux disease without esophagitis: Secondary | ICD-10-CM | POA: Diagnosis not present

## 2019-12-18 ENCOUNTER — Ambulatory Visit: Payer: PPO | Attending: Internal Medicine

## 2019-12-18 DIAGNOSIS — Z23 Encounter for immunization: Secondary | ICD-10-CM

## 2019-12-18 NOTE — Progress Notes (Signed)
   Covid-19 Vaccination Clinic  Name:  TANDY ZOTO    MRN: [id number] DOB: 01-31-1951  12/18/2019  Ms. Slate was observed post Covid-19 immunization for 15 minutes without incident. She was provided with Vaccine Information Sheet and instruction to access the V-Safe system.   Ms. Milby was instructed to call 911 with any severe reactions post vaccine: Marland Kitchen Difficulty breathing  . Swelling of face and throat  . A fast heartbeat  . A bad rash all over body  . Dizziness and weakness   Immunizations Administered    Name Date Dose VIS Date Route   Moderna COVID-19 Vaccine 12/18/2019 10:54 AM 0.5 mL 09/11/2019 Intramuscular   Manufacturer: Moderna   Lot: [id number]   [id number]

## 2019-12-20 ENCOUNTER — Ambulatory Visit (HOSPITAL_COMMUNITY): Payer: PPO

## 2020-01-15 DIAGNOSIS — I10 Essential (primary) hypertension: Secondary | ICD-10-CM | POA: Diagnosis not present

## 2020-01-15 DIAGNOSIS — K219 Gastro-esophageal reflux disease without esophagitis: Secondary | ICD-10-CM | POA: Diagnosis not present

## 2020-01-30 ENCOUNTER — Ambulatory Visit (HOSPITAL_COMMUNITY): Payer: PPO

## 2020-02-06 ENCOUNTER — Ambulatory Visit (HOSPITAL_COMMUNITY)
Admission: RE | Admit: 2020-02-06 | Discharge: 2020-02-06 | Disposition: A | Discharge status: Home / Self Care | Payer: PPO | Source: Ambulatory Visit | Attending: Internal Medicine | Admitting: Internal Medicine

## 2020-02-06 ENCOUNTER — Other Ambulatory Visit: Payer: Self-pay

## 2020-02-06 DIAGNOSIS — Z1231 Encounter for screening mammogram for malignant neoplasm of breast: Secondary | ICD-10-CM | POA: Insufficient documentation

## 2020-02-18 DIAGNOSIS — Z6831 Body mass index (BMI) 31.0-31.9, adult: Secondary | ICD-10-CM | POA: Diagnosis not present

## 2020-02-18 DIAGNOSIS — Z79899 Other long term (current) drug therapy: Secondary | ICD-10-CM | POA: Diagnosis not present

## 2020-02-18 DIAGNOSIS — Z1389 Encounter for screening for other disorder: Secondary | ICD-10-CM | POA: Diagnosis not present

## 2020-02-18 DIAGNOSIS — I10 Essential (primary) hypertension: Secondary | ICD-10-CM | POA: Diagnosis not present

## 2020-02-18 DIAGNOSIS — Z0001 Encounter for general adult medical examination with abnormal findings: Secondary | ICD-10-CM | POA: Diagnosis not present

## 2020-02-18 DIAGNOSIS — K219 Gastro-esophageal reflux disease without esophagitis: Secondary | ICD-10-CM | POA: Diagnosis not present

## 2020-02-18 DIAGNOSIS — E785 Hyperlipidemia, unspecified: Secondary | ICD-10-CM | POA: Diagnosis not present

## 2020-02-18 DIAGNOSIS — Z1331 Encounter for screening for depression: Secondary | ICD-10-CM | POA: Diagnosis not present

## 2020-02-28 ENCOUNTER — Other Ambulatory Visit: Payer: Self-pay

## 2020-02-28 ENCOUNTER — Ambulatory Visit (HOSPITAL_COMMUNITY)
Admission: RE | Admit: 2020-02-28 | Discharge: 2020-02-28 | Disposition: A | Discharge status: Home / Self Care | Payer: PPO | Source: Ambulatory Visit | Attending: Internal Medicine | Admitting: Internal Medicine

## 2020-02-28 DIAGNOSIS — M85851 Other specified disorders of bone density and structure, right thigh: Secondary | ICD-10-CM | POA: Diagnosis not present

## 2020-02-28 DIAGNOSIS — Z780 Asymptomatic menopausal state: Secondary | ICD-10-CM | POA: Insufficient documentation

## 2020-03-20 DIAGNOSIS — I10 Essential (primary) hypertension: Secondary | ICD-10-CM | POA: Diagnosis not present

## 2020-03-20 DIAGNOSIS — K219 Gastro-esophageal reflux disease without esophagitis: Secondary | ICD-10-CM | POA: Diagnosis not present

## 2020-04-19 DIAGNOSIS — I10 Essential (primary) hypertension: Secondary | ICD-10-CM | POA: Diagnosis not present

## 2020-04-19 DIAGNOSIS — K219 Gastro-esophageal reflux disease without esophagitis: Secondary | ICD-10-CM | POA: Diagnosis not present

## 2020-05-06 ENCOUNTER — Other Ambulatory Visit: Payer: Self-pay

## 2020-05-20 DIAGNOSIS — K219 Gastro-esophageal reflux disease without esophagitis: Secondary | ICD-10-CM | POA: Diagnosis not present

## 2020-05-20 DIAGNOSIS — M1990 Unspecified osteoarthritis, unspecified site: Secondary | ICD-10-CM | POA: Diagnosis not present

## 2020-06-13 DIAGNOSIS — B029 Zoster without complications: Secondary | ICD-10-CM | POA: Diagnosis not present

## 2020-06-13 DIAGNOSIS — I10 Essential (primary) hypertension: Secondary | ICD-10-CM | POA: Diagnosis not present

## 2020-07-13 DIAGNOSIS — M1990 Unspecified osteoarthritis, unspecified site: Secondary | ICD-10-CM | POA: Diagnosis not present

## 2020-07-13 DIAGNOSIS — I10 Essential (primary) hypertension: Secondary | ICD-10-CM | POA: Diagnosis not present

## 2020-08-18 DIAGNOSIS — I10 Essential (primary) hypertension: Secondary | ICD-10-CM | POA: Diagnosis not present

## 2020-08-18 DIAGNOSIS — K219 Gastro-esophageal reflux disease without esophagitis: Secondary | ICD-10-CM | POA: Diagnosis not present

## 2020-08-18 DIAGNOSIS — F321 Major depressive disorder, single episode, moderate: Secondary | ICD-10-CM | POA: Diagnosis not present

## 2020-09-17 DIAGNOSIS — K219 Gastro-esophageal reflux disease without esophagitis: Secondary | ICD-10-CM | POA: Diagnosis not present

## 2020-09-17 DIAGNOSIS — I10 Essential (primary) hypertension: Secondary | ICD-10-CM | POA: Diagnosis not present

## 2020-10-18 DIAGNOSIS — I10 Essential (primary) hypertension: Secondary | ICD-10-CM | POA: Diagnosis not present

## 2020-10-18 DIAGNOSIS — K219 Gastro-esophageal reflux disease without esophagitis: Secondary | ICD-10-CM | POA: Diagnosis not present

## 2020-11-18 DIAGNOSIS — M1990 Unspecified osteoarthritis, unspecified site: Secondary | ICD-10-CM | POA: Diagnosis not present

## 2020-11-18 DIAGNOSIS — I10 Essential (primary) hypertension: Secondary | ICD-10-CM | POA: Diagnosis not present

## 2020-12-16 DIAGNOSIS — K219 Gastro-esophageal reflux disease without esophagitis: Secondary | ICD-10-CM | POA: Diagnosis not present

## 2020-12-16 DIAGNOSIS — I10 Essential (primary) hypertension: Secondary | ICD-10-CM | POA: Diagnosis not present

## 2020-12-23 ENCOUNTER — Other Ambulatory Visit (HOSPITAL_COMMUNITY): Payer: Self-pay | Admitting: Internal Medicine

## 2020-12-23 DIAGNOSIS — Z1231 Encounter for screening mammogram for malignant neoplasm of breast: Secondary | ICD-10-CM

## 2021-01-16 DIAGNOSIS — I10 Essential (primary) hypertension: Secondary | ICD-10-CM | POA: Diagnosis not present

## 2021-01-16 DIAGNOSIS — K219 Gastro-esophageal reflux disease without esophagitis: Secondary | ICD-10-CM | POA: Diagnosis not present

## 2021-02-09 ENCOUNTER — Ambulatory Visit (HOSPITAL_COMMUNITY): Payer: PPO

## 2021-02-09 DIAGNOSIS — I10 Essential (primary) hypertension: Secondary | ICD-10-CM | POA: Diagnosis not present

## 2021-02-09 DIAGNOSIS — F339 Major depressive disorder, recurrent, unspecified: Secondary | ICD-10-CM | POA: Diagnosis not present

## 2021-02-09 DIAGNOSIS — Z0001 Encounter for general adult medical examination with abnormal findings: Secondary | ICD-10-CM | POA: Diagnosis not present

## 2021-02-09 DIAGNOSIS — Z79899 Other long term (current) drug therapy: Secondary | ICD-10-CM | POA: Diagnosis not present

## 2021-02-09 DIAGNOSIS — Z1389 Encounter for screening for other disorder: Secondary | ICD-10-CM | POA: Diagnosis not present

## 2021-02-09 DIAGNOSIS — Z1331 Encounter for screening for depression: Secondary | ICD-10-CM | POA: Diagnosis not present

## 2021-02-09 DIAGNOSIS — E785 Hyperlipidemia, unspecified: Secondary | ICD-10-CM | POA: Diagnosis not present

## 2021-02-11 ENCOUNTER — Ambulatory Visit (HOSPITAL_COMMUNITY)
Admission: RE | Admit: 2021-02-11 | Discharge: 2021-02-11 | Disposition: A | Discharge status: Home / Self Care | Payer: PPO | Source: Ambulatory Visit | Attending: Internal Medicine | Admitting: Internal Medicine

## 2021-02-11 DIAGNOSIS — Z1231 Encounter for screening mammogram for malignant neoplasm of breast: Secondary | ICD-10-CM | POA: Diagnosis not present

## 2021-03-12 DIAGNOSIS — I10 Essential (primary) hypertension: Secondary | ICD-10-CM | POA: Diagnosis not present

## 2021-03-12 DIAGNOSIS — K219 Gastro-esophageal reflux disease without esophagitis: Secondary | ICD-10-CM | POA: Diagnosis not present

## 2021-04-11 DIAGNOSIS — K219 Gastro-esophageal reflux disease without esophagitis: Secondary | ICD-10-CM | POA: Diagnosis not present

## 2021-04-11 DIAGNOSIS — I10 Essential (primary) hypertension: Secondary | ICD-10-CM | POA: Diagnosis not present

## 2021-04-20 DIAGNOSIS — I10 Essential (primary) hypertension: Secondary | ICD-10-CM | POA: Diagnosis not present

## 2021-04-20 DIAGNOSIS — R1310 Dysphagia, unspecified: Secondary | ICD-10-CM | POA: Diagnosis not present

## 2021-04-20 DIAGNOSIS — K219 Gastro-esophageal reflux disease without esophagitis: Secondary | ICD-10-CM | POA: Diagnosis not present

## 2021-04-22 ENCOUNTER — Encounter: Payer: Self-pay | Admitting: Internal Medicine

## 2021-05-21 DIAGNOSIS — E785 Hyperlipidemia, unspecified: Secondary | ICD-10-CM | POA: Diagnosis not present

## 2021-05-21 DIAGNOSIS — I10 Essential (primary) hypertension: Secondary | ICD-10-CM | POA: Diagnosis not present

## 2021-06-26 DIAGNOSIS — I10 Essential (primary) hypertension: Secondary | ICD-10-CM | POA: Diagnosis not present

## 2021-06-26 DIAGNOSIS — Z23 Encounter for immunization: Secondary | ICD-10-CM | POA: Diagnosis not present

## 2021-06-26 DIAGNOSIS — L0202 Furuncle of face: Secondary | ICD-10-CM | POA: Diagnosis not present

## 2021-07-26 DIAGNOSIS — K219 Gastro-esophageal reflux disease without esophagitis: Secondary | ICD-10-CM | POA: Diagnosis not present

## 2021-07-26 DIAGNOSIS — I10 Essential (primary) hypertension: Secondary | ICD-10-CM | POA: Diagnosis not present

## 2021-08-14 ENCOUNTER — Encounter: Payer: Self-pay | Admitting: Gastroenterology

## 2021-08-14 ENCOUNTER — Other Ambulatory Visit: Payer: Self-pay

## 2021-08-14 ENCOUNTER — Encounter: Payer: Self-pay | Admitting: *Deleted

## 2021-08-14 ENCOUNTER — Ambulatory Visit: Payer: PPO | Admitting: Gastroenterology

## 2021-08-14 ENCOUNTER — Other Ambulatory Visit: Payer: Self-pay | Admitting: *Deleted

## 2021-08-14 VITALS — BP 150/83 | HR 83 | Temp 96.9°F | Ht 69.0 in | Wt 212.8 lb

## 2021-08-14 DIAGNOSIS — K219 Gastro-esophageal reflux disease without esophagitis: Secondary | ICD-10-CM | POA: Diagnosis not present

## 2021-08-14 DIAGNOSIS — Z86010 Personal history of colonic polyps: Secondary | ICD-10-CM

## 2021-08-14 DIAGNOSIS — Z860101 Personal history of adenomatous and serrated colon polyps: Secondary | ICD-10-CM | POA: Insufficient documentation

## 2021-08-14 DIAGNOSIS — R1310 Dysphagia, unspecified: Secondary | ICD-10-CM

## 2021-08-14 MED ORDER — PEG 3350-KCL-NA BICARB-NACL 420 G PO SOLR: ORAL | 0 refills | Status: DC

## 2021-08-14 MED ORDER — OMEPRAZOLE 20 MG PO CPDR: DELAYED_RELEASE_CAPSULE | ORAL | 3 refills | Status: DC

## 2021-08-14 NOTE — H&P (View-Only) (Signed)
Primary Care Physician:  Rosita Fire, MD  Primary Gastroenterologist:  Elon Alas. Abbey Chatters, DO    Chief Complaint  Patient presents with   Dysphagia   Gastroesophageal Reflux   Colonoscopy    HPI:  Jamie Hunt is a 70 y.o. female here for GERD, dysphagia, colonoscopy.  Patient was last seen in August 2017.  Having trouble swallowing pills and some solid foods. Heartburn reasonable well controlled. Sometimes has to take second dose of omeprazole. No n/v. BM every day to every other day. No melena, brbpr. Sometimes has some epigastric pain. Sometimes worse if stomach empty. Sometimes bloating in epigastric area after meals. No weight loss. Low dose ASA but no other NSAIDs.  Labs from May 2022: BUN 10, creatinine 0.9, albumin 4.5, total bilirubin 0.6, alkaline phosphatase 60, AST 15, ALT 14, white blood cell count 5600, hemoglobin 11.8 normal, MCV 96.6, platelets 254,000  EGD in May 2017: Gastritis, biopsy negative for H. pylori.  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  Colonoscopy August 2015: 6 polyps removed, redundant left colon, moderate sized internal hemorrhoids, moderate diverticulosis.  Pathology revealed some tubular adenomas and advised for follow-up colonoscopy in 5 to 10 years.  Current Outpatient Medications  Medication Sig Dispense Refill   aspirin EC 81 MG tablet Take 81 mg by mouth daily.     Calcium Carbonate-Vitamin D (CALCIUM-D PO) Take 1 tablet by mouth daily.     cholecalciferol (VITAMIN D3) 25 MCG (1000 UT) tablet Take 1,000 Units by mouth daily.     docusate sodium (COLACE) 100 MG capsule Take 100 mg by mouth 2 (two) times daily.     escitalopram (LEXAPRO) 10 MG tablet Take 10 mg by mouth daily.     fexofenadine (ALLEGRA) 180 MG tablet Take 180 mg by mouth daily.     fluticasone (FLONASE) 50 MCG/ACT nasal spray Place 1 spray into both nostrils daily for 10  days. (Patient taking differently: Place 1 spray into both nostrils daily as needed for allergies.) 16 g 0   hydrALAZINE (APRESOLINE) 50 MG tablet Take 50 mg by mouth 2 (two) times a day.   3   hydrochlorothiazide (HYDRODIURIL) 25 MG tablet Take 25 mg by mouth daily.     losartan (COZAAR) 100 MG tablet Take 100 mg by mouth daily.      lovastatin (MEVACOR) 20 MG tablet Take 20 mg by mouth daily.      Multiple Vitamins-Minerals (ONE-A-DAY 50 PLUS) TABS Take 1 tablet by mouth daily.     omeprazole (PRILOSEC) 20 MG capsule Take 1 capsule (20 mg total) by mouth 2 (two) times daily before a meal. (Patient taking differently: Take 20 mg by mouth See admin instructions. Take 20 mg daily, may take a second 20 mg dose as needed for heartburn)     Polyethyl Glycol-Propyl Glycol (SYSTANE OP) Place 1 drop into both eyes 3 (three) times daily.     Potassium 99 MG TABS Take 99 mg by mouth daily.     Simethicone (GAS-X PO) Take 1 tablet by mouth daily as needed (gas).     traZODone (DESYREL) 50 MG tablet Take 50 mg by mouth at bedtime.     verapamil (CALAN-SR) 240 MG CR tablet Take 240 mg by mouth daily.      No current facility-administered medications for this visit.    Allergies as of 08/14/2021 - Review Complete 08/14/2021  Allergen Reaction Noted   Codeine  Nausea And Vomiting 04/22/2014   Penicillins  11/30/2011    Past Medical History:  Diagnosis Date   Acid reflux    Anemia    Arthritis    High cholesterol    Hypertension    PONV (postoperative nausea and vomiting)    Shingles     Past Surgical History:  Procedure Laterality Date   ABDOMINAL HYSTERECTOMY     BACK SURGERY     CATARACT EXTRACTION W/PHACO Right 05/18/2019   Procedure: CATARACT EXTRACTION PHACO AND INTRAOCULAR LENS PLACEMENT ([place]);  Surgeon: [name], MD;  Location: [facility];  Service: Ophthalmology;  Laterality: Right;  CDE: 6.76   CATARACT EXTRACTION W/PHACO Left 06/01/2019   Procedure: CATARACT EXTRACTION PHACO AND  INTRAOCULAR LENS PLACEMENT ([facility]);  Surgeon: [name], MD;  Location: [facility];  Service: Ophthalmology;  Laterality: Left;  left, CDE: 8.69   CHOLECYSTECTOMY     COLONOSCOPY  05/08/2009   SLF:A few scattered diverticula throughout the entire colon.  Small  internal hemorrhoids.  Otherwise, no polyps, masses, inflammatory changes, or AVMs seen.  Normal retroflexed view of the rectum   COLONOSCOPY N/A 06/03/2014   SLF:  1. six colon polyps removed. 2. Moderate sized internal hemorrhoids. 3. The left colon is redundant 4.  Moderate diverticulosis throughout the entire examed colon. next TCS in10 years with overtube.   ESOPHAGOGASTRODUODENOSCOPY N/A 06/03/2014   SLF:  1. small hiatal hernia   ESOPHAGOGASTRODUODENOSCOPY N/A 02/16/2016   slf: gastritis, schazki ring   GIVENS CAPSULE STUDY N/A 03/19/2016   Procedure: GIVENS CAPSULE STUDY;  Surgeon: Danie Binder, MD;  Location: [facility] SU[facility];  Service: Endoscopy;  Laterality: N/A;  0700   VASCULAR SURGERY     varicose veins    Family History  Problem Relation Age of Onset   Colon cancer Neg Hx    Liver disease Neg Hx     Social History   Socioeconomic History   Marital status: Widowed    Spouse name: Not on file   Number of children: Not on file   Years of education: Not on file   Highest education level: Not on file  Occupational History   Not on file  Tobacco Use   Smoking status: Never   Smokeless tobacco: Never   Tobacco comments:    Never smoked  Vaping Use   Vaping Use: Never used  Substance and Sexual Activity   Alcohol use: No    Alcohol/week: 0.0 standard drinks   Drug use: No   Sexual activity: Not on file  Other Topics Concern   Not on file  Social History Narrative   Not on file   Social Determinants of Health   Financial Resource Strain: Not on file  Food Insecurity: Not on file  Transportation Needs: Not on file  Physical Activity: Not on file  Stress: Not on file  Social Connections: Not on file   Intimate Partner Violence: Not on file      ROS:  General: Negative for anorexia, weight loss, fever, chills, fatigue, weakness. Eyes: Negative for vision changes.  ENT: Negative for hoarseness,  nasal congestion.  See HPI CV: Negative for chest pain, angina, palpitations, dyspnea on exertion, peripheral edema.  Respiratory: Negative for dyspnea at rest, dyspnea on exertion, cough, sputum, wheezing.  GI: See history of present illness. GU:  Negative for dysuria, hematuria, urinary incontinence, urinary frequency, nocturnal urination.  MS: Negative for joint pain, positive low back pain.  Derm: Negative for rash or itching.  Neuro:  Negative for weakness, abnormal sensation, seizure, frequent headaches, memory loss, confusion.  Psych: Negative for anxiety, depression, suicidal ideation, hallucinations.  Endo: Negative for unusual weight change.  Heme: Negative for bruising or bleeding. Allergy: Negative for rash or hives.    Physical Examination:  BP (!) 150/83   Pulse 83   Temp (!) 96.9 F (36.1 C) (Temporal)   Ht 5\' 9"  (1.753 m)   Wt 212 lb 12.8 oz (96.5 kg)   BMI 31.43 kg/m    General: Well-nourished, well-developed in no acute distress.  Head: Normocephalic, atraumatic.   Eyes: Conjunctiva pink, no icterus. Mouth: masked Neck: Supple without thyromegaly, masses, or lymphadenopathy.  Lungs: Clear to auscultation bilaterally.  Heart: Regular rate and rhythm, no murmurs rubs or gallops.  Abdomen: Bowel sounds are normal, nontender, nondistended, no hepatosplenomegaly or masses, no abdominal bruits or    hernia , no rebound or guarding.  Rectus diastases Rectal: not performed Extremities: No lower extremity edema. No clubbing or deformities.  Neuro: Alert and oriented x 4 , grossly normal neurologically.  Skin: Warm and dry, no rash or jaundice.   Psych: Alert and cooperative, normal mood and affect.  Labs: See HPI  Imaging Studies: No results  found.   Assessment:  GERD/dysphagia: History of chronic GERD and prior Schatzki ring requiring dilation.  Patient having increasing difficulty swallowing certain pills and some solid foods.  She may have esophagitis versus stricture/ring/web.  Epigastric pain/bloating: Possibly dyspepsia, evaluate for gastritis/ulcer at time of endoscopy.  History of colon polyps: Due for surveillance colonoscopy at any time.   Plan: Omeprazole 20mg  once to twice daily. #90 and 3 refills.  EGD/EGD/colonoscopy with Dr. Abbey Chatters.  ASA 2.  I have discussed the risks, alternatives, benefits with regards to but not limited to the risk of reaction to medication, bleeding, infection, perforation and the patient is agreeable to proceed. Written consent to be obtained.

## 2021-08-14 NOTE — Patient Instructions (Signed)
Continue omeprazole 20 mg 30 minutes before breakfast daily.  You may take a second dose 30 minutes before supper if needed. Colonoscopy and upper endoscopy to be scheduled.  Please see separate instructions

## 2021-08-14 NOTE — Progress Notes (Signed)
Primary Care Physician:  Rosita Fire, MD  Primary Gastroenterologist:  Elon Alas. Abbey Chatters, DO    Chief Complaint  Patient presents with   Dysphagia   Gastroesophageal Reflux   Colonoscopy    HPI:  Jamie Hunt is a 70 y.o. female here for GERD, dysphagia, colonoscopy.  Patient was last seen in August 2017.  Having trouble swallowing pills and some solid foods. Heartburn reasonable well controlled. Sometimes has to take second dose of omeprazole. No n/v. BM every day to every other day. No melena, brbpr. Sometimes has some epigastric pain. Sometimes worse if stomach empty. Sometimes bloating in epigastric area after meals. No weight loss. Low dose ASA but no other NSAIDs.  Labs from May 2022: BUN 10, creatinine 0.9, albumin 4.5, total bilirubin 0.6, alkaline phosphatase 60, AST 15, ALT 14, white blood cell count 5600, hemoglobin 11.8 normal, MCV 96.6, platelets 254,000  EGD in May 2017: Gastritis, biopsy negative for H. pylori.  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  Colonoscopy August 2015: 6 polyps removed, redundant left colon, moderate sized internal hemorrhoids, moderate diverticulosis.  Pathology revealed some tubular adenomas and advised for follow-up colonoscopy in 5 to 10 years.  Current Outpatient Medications  Medication Sig Dispense Refill   aspirin EC 81 MG tablet Take 81 mg by mouth daily.     Calcium Carbonate-Vitamin D (CALCIUM-D PO) Take 1 tablet by mouth daily.     cholecalciferol (VITAMIN D3) 25 MCG (1000 UT) tablet Take 1,000 Units by mouth daily.     docusate sodium (COLACE) 100 MG capsule Take 100 mg by mouth 2 (two) times daily.     escitalopram (LEXAPRO) 10 MG tablet Take 10 mg by mouth daily.     fexofenadine (ALLEGRA) 180 MG tablet Take 180 mg by mouth daily.     fluticasone (FLONASE) 50 MCG/ACT nasal spray Place 1 spray into both nostrils daily for 10  days. (Patient taking differently: Place 1 spray into both nostrils daily as needed for allergies.) 16 g 0   hydrALAZINE (APRESOLINE) 50 MG tablet Take 50 mg by mouth 2 (two) times a day.   3   hydrochlorothiazide (HYDRODIURIL) 25 MG tablet Take 25 mg by mouth daily.     losartan (COZAAR) 100 MG tablet Take 100 mg by mouth daily.      lovastatin (MEVACOR) 20 MG tablet Take 20 mg by mouth daily.      Multiple Vitamins-Minerals (ONE-A-DAY 50 PLUS) TABS Take 1 tablet by mouth daily.     omeprazole (PRILOSEC) 20 MG capsule Take 1 capsule (20 mg total) by mouth 2 (two) times daily before a meal. (Patient taking differently: Take 20 mg by mouth See admin instructions. Take 20 mg daily, may take a second 20 mg dose as needed for heartburn)     Polyethyl Glycol-Propyl Glycol (SYSTANE OP) Place 1 drop into both eyes 3 (three) times daily.     Potassium 99 MG TABS Take 99 mg by mouth daily.     Simethicone (GAS-X PO) Take 1 tablet by mouth daily as needed (gas).     traZODone (DESYREL) 50 MG tablet Take 50 mg by mouth at bedtime.     verapamil (CALAN-SR) 240 MG CR tablet Take 240 mg by mouth daily.      No current facility-administered medications for this visit.    Allergies as of 08/14/2021 - Review Complete 08/14/2021  Allergen Reaction Noted   Codeine  Nausea And Vomiting 04/22/2014   Penicillins  11/30/2011    Past Medical History:  Diagnosis Date   Acid reflux    Anemia    Arthritis    High cholesterol    Hypertension    PONV (postoperative nausea and vomiting)    Shingles     Past Surgical History:  Procedure Laterality Date   ABDOMINAL HYSTERECTOMY     BACK SURGERY     CATARACT EXTRACTION W/PHACO Right 05/18/2019   Procedure: CATARACT EXTRACTION PHACO AND INTRAOCULAR LENS PLACEMENT ([place]);  Surgeon: [name], MD;  Location: [facility];  Service: Ophthalmology;  Laterality: Right;  CDE: 6.76   CATARACT EXTRACTION W/PHACO Left 06/01/2019   Procedure: CATARACT EXTRACTION PHACO AND  INTRAOCULAR LENS PLACEMENT ([facility]);  Surgeon: [name], MD;  Location: [facility];  Service: Ophthalmology;  Laterality: Left;  left, CDE: 8.69   CHOLECYSTECTOMY     COLONOSCOPY  05/08/2009   SLF:A few scattered diverticula throughout the entire colon.  Small  internal hemorrhoids.  Otherwise, no polyps, masses, inflammatory changes, or AVMs seen.  Normal retroflexed view of the rectum   COLONOSCOPY N/A 06/03/2014   SLF:  1. six colon polyps removed. 2. Moderate sized internal hemorrhoids. 3. The left colon is redundant 4.  Moderate diverticulosis throughout the entire examed colon. next TCS in10 years with overtube.   ESOPHAGOGASTRODUODENOSCOPY N/A 06/03/2014   SLF:  1. small hiatal hernia   ESOPHAGOGASTRODUODENOSCOPY N/A 02/16/2016   slf: gastritis, schazki ring   GIVENS CAPSULE STUDY N/A 03/19/2016   Procedure: GIVENS CAPSULE STUDY;  Surgeon: Danie Binder, MD;  Location: [facility];  Service: Endoscopy;  Laterality: N/A;  0700   VASCULAR SURGERY     varicose veins    Family History  Problem Relation Age of Onset   Colon cancer Neg Hx    Liver disease Neg Hx     Social History   Socioeconomic History   Marital status: Widowed    Spouse name: Not on file   Number of children: Not on file   Years of education: Not on file   Highest education level: Not on file  Occupational History   Not on file  Tobacco Use   Smoking status: Never   Smokeless tobacco: Never   Tobacco comments:    Never smoked  Vaping Use   Vaping Use: Never used  Substance and Sexual Activity   Alcohol use: No    Alcohol/week: 0.0 standard drinks   Drug use: No   Sexual activity: Not on file  Other Topics Concern   Not on file  Social History Narrative   Not on file   Social Determinants of Health   Financial Resource Strain: Not on file  Food Insecurity: Not on file  Transportation Needs: Not on file  Physical Activity: Not on file  Stress: Not on file  Social Connections: Not on file   Intimate Partner Violence: Not on file      ROS:  General: Negative for anorexia, weight loss, fever, chills, fatigue, weakness. Eyes: Negative for vision changes.  ENT: Negative for hoarseness,  nasal congestion.  See HPI CV: Negative for chest pain, angina, palpitations, dyspnea on exertion, peripheral edema.  Respiratory: Negative for dyspnea at rest, dyspnea on exertion, cough, sputum, wheezing.  GI: See history of present illness. GU:  Negative for dysuria, hematuria, urinary incontinence, urinary frequency, nocturnal urination.  MS: Negative for joint pain, positive low back pain.  Derm: Negative for rash or itching.  Neuro:  Negative for weakness, abnormal sensation, seizure, frequent headaches, memory loss, confusion.  Psych: Negative for anxiety, depression, suicidal ideation, hallucinations.  Endo: Negative for unusual weight change.  Heme: Negative for bruising or bleeding. Allergy: Negative for rash or hives.    Physical Examination:  BP (!) 150/83   Pulse 83   Temp (!) 96.9 F (36.1 C) (Temporal)   Ht 5\' 9"  (1.753 m)   Wt 212 lb 12.8 oz (96.5 kg)   BMI 31.43 kg/m    General: Well-nourished, well-developed in no acute distress.  Head: Normocephalic, atraumatic.   Eyes: Conjunctiva pink, no icterus. Mouth: masked Neck: Supple without thyromegaly, masses, or lymphadenopathy.  Lungs: Clear to auscultation bilaterally.  Heart: Regular rate and rhythm, no murmurs rubs or gallops.  Abdomen: Bowel sounds are normal, nontender, nondistended, no hepatosplenomegaly or masses, no abdominal bruits or    hernia , no rebound or guarding.  Rectus diastases Rectal: not performed Extremities: No lower extremity edema. No clubbing or deformities.  Neuro: Alert and oriented x 4 , grossly normal neurologically.  Skin: Warm and dry, no rash or jaundice.   Psych: Alert and cooperative, normal mood and affect.  Labs: See HPI  Imaging Studies: No results  found.   Assessment:  GERD/dysphagia: History of chronic GERD and prior Schatzki ring requiring dilation.  Patient having increasing difficulty swallowing certain pills and some solid foods.  She may have esophagitis versus stricture/ring/web.  Epigastric pain/bloating: Possibly dyspepsia, evaluate for gastritis/ulcer at time of endoscopy.  History of colon polyps: Due for surveillance colonoscopy at any time.   Plan: Omeprazole 20mg  once to twice daily. #90 and 3 refills.  EGD/EGD/colonoscopy with Dr. Abbey Chatters.  ASA 2.  I have discussed the risks, alternatives, benefits with regards to but not limited to the risk of reaction to medication, bleeding, infection, perforation and the patient is agreeable to proceed. Written consent to be obtained.

## 2021-08-17 DIAGNOSIS — I10 Essential (primary) hypertension: Secondary | ICD-10-CM | POA: Diagnosis not present

## 2021-08-17 DIAGNOSIS — K219 Gastro-esophageal reflux disease without esophagitis: Secondary | ICD-10-CM | POA: Diagnosis not present

## 2021-08-17 DIAGNOSIS — J309 Allergic rhinitis, unspecified: Secondary | ICD-10-CM | POA: Diagnosis not present

## 2021-08-17 DIAGNOSIS — F339 Major depressive disorder, recurrent, unspecified: Secondary | ICD-10-CM | POA: Diagnosis not present

## 2021-09-09 ENCOUNTER — Other Ambulatory Visit (HOSPITAL_COMMUNITY)
Admission: RE | Admit: 2021-09-09 | Discharge: 2021-09-09 | Disposition: A | Discharge status: Home / Self Care | Payer: PPO | Source: Ambulatory Visit | Attending: Internal Medicine | Admitting: Internal Medicine

## 2021-09-09 DIAGNOSIS — Z86010 Personal history of colonic polyps: Secondary | ICD-10-CM | POA: Insufficient documentation

## 2021-09-09 DIAGNOSIS — K219 Gastro-esophageal reflux disease without esophagitis: Secondary | ICD-10-CM | POA: Diagnosis not present

## 2021-09-09 DIAGNOSIS — Z860101 Personal history of adenomatous and serrated colon polyps: Secondary | ICD-10-CM

## 2021-09-09 DIAGNOSIS — R1310 Dysphagia, unspecified: Secondary | ICD-10-CM | POA: Diagnosis not present

## 2021-09-09 LAB — BASIC METABOLIC PANEL
Anion gap: 8 (ref 5–15)
BUN: 12 mg/dL (ref 8–23)
CO2: 27 mmol/L (ref 22–32)
Calcium: 9.4 mg/dL (ref 8.9–10.3)
Chloride: 105 mmol/L (ref 98–111)
Creatinine, Ser: 0.94 mg/dL (ref 0.44–1.00)
GFR, Estimated: >60 mL/min (ref >60)
Glucose, Bld: 93 mg/dL (ref 70–99)
Potassium: 3.7 mmol/L (ref 3.5–5.1)
Sodium: 140 mmol/L (ref 135–145)

## 2021-09-11 ENCOUNTER — Encounter (HOSPITAL_COMMUNITY)
Admission: RE | Disposition: A | Discharge status: Home / Self Care | Payer: Self-pay | Source: Home / Self Care | Attending: Internal Medicine

## 2021-09-11 ENCOUNTER — Ambulatory Visit (HOSPITAL_COMMUNITY): Payer: PPO | Admitting: Certified Registered Nurse Anesthetist

## 2021-09-11 ENCOUNTER — Ambulatory Visit (HOSPITAL_COMMUNITY)
Admission: RE | Admit: 2021-09-11 | Discharge: 2021-09-11 | Disposition: A | Discharge status: Home / Self Care | Payer: PPO | Attending: Internal Medicine | Admitting: Internal Medicine

## 2021-09-11 ENCOUNTER — Other Ambulatory Visit: Payer: Self-pay

## 2021-09-11 ENCOUNTER — Encounter (HOSPITAL_COMMUNITY): Payer: Self-pay

## 2021-09-11 DIAGNOSIS — K319 Disease of stomach and duodenum, unspecified: Secondary | ICD-10-CM | POA: Diagnosis not present

## 2021-09-11 DIAGNOSIS — Z1211 Encounter for screening for malignant neoplasm of colon: Secondary | ICD-10-CM | POA: Diagnosis not present

## 2021-09-11 DIAGNOSIS — K5730 Diverticulosis of large intestine without perforation or abscess without bleeding: Secondary | ICD-10-CM | POA: Insufficient documentation

## 2021-09-11 DIAGNOSIS — K222 Esophageal obstruction: Secondary | ICD-10-CM | POA: Diagnosis not present

## 2021-09-11 DIAGNOSIS — D122 Benign neoplasm of ascending colon: Secondary | ICD-10-CM | POA: Insufficient documentation

## 2021-09-11 DIAGNOSIS — R222 Localized swelling, mass and lump, trunk: Secondary | ICD-10-CM | POA: Insufficient documentation

## 2021-09-11 DIAGNOSIS — I10 Essential (primary) hypertension: Secondary | ICD-10-CM | POA: Insufficient documentation

## 2021-09-11 DIAGNOSIS — K219 Gastro-esophageal reflux disease without esophagitis: Secondary | ICD-10-CM | POA: Diagnosis not present

## 2021-09-11 DIAGNOSIS — Z86010 Personal history of colonic polyps: Secondary | ICD-10-CM | POA: Insufficient documentation

## 2021-09-11 DIAGNOSIS — K2970 Gastritis, unspecified, without bleeding: Secondary | ICD-10-CM | POA: Diagnosis not present

## 2021-09-11 DIAGNOSIS — K648 Other hemorrhoids: Secondary | ICD-10-CM | POA: Diagnosis not present

## 2021-09-11 DIAGNOSIS — D120 Benign neoplasm of cecum: Secondary | ICD-10-CM | POA: Diagnosis not present

## 2021-09-11 DIAGNOSIS — K635 Polyp of colon: Secondary | ICD-10-CM | POA: Diagnosis not present

## 2021-09-11 DIAGNOSIS — R1310 Dysphagia, unspecified: Secondary | ICD-10-CM | POA: Diagnosis not present

## 2021-09-11 HISTORY — PX: BIOPSY: SHX5522

## 2021-09-11 HISTORY — PX: ESOPHAGOGASTRODUODENOSCOPY (EGD) WITH PROPOFOL: SHX5813

## 2021-09-11 HISTORY — PX: COLONOSCOPY WITH PROPOFOL: SHX5780

## 2021-09-11 HISTORY — PX: BALLOON DILATION: SHX5330

## 2021-09-11 SURGERY — COLONOSCOPY WITH PROPOFOL
Anesthesia: General

## 2021-09-11 MED ORDER — LACTATED RINGERS IV SOLN
INTRAVENOUS | Status: DC
  Administered 2021-09-11: 1000 mL via INTRAVENOUS

## 2021-09-11 MED ORDER — PROPOFOL 500 MG/50ML IV EMUL
INTRAVENOUS | Status: DC | PRN
  Administered 2021-09-11: 150 ug/kg/min via INTRAVENOUS

## 2021-09-11 MED ORDER — LIDOCAINE HCL (CARDIAC) PF 100 MG/5ML IV SOSY
PREFILLED_SYRINGE | INTRAVENOUS | Status: DC | PRN
  Administered 2021-09-11: 50 mg via INTRAVENOUS

## 2021-09-11 MED ORDER — PROPOFOL 10 MG/ML IV BOLUS
INTRAVENOUS | Status: DC | PRN
  Administered 2021-09-11: 100 mg via INTRAVENOUS

## 2021-09-11 NOTE — Discharge Instructions (Addendum)
EGD Discharge instructions Please read the instructions outlined below and refer to this sheet in the next few weeks. These discharge instructions provide you with general information on caring for yourself after you leave the hospital. Your doctor may also give you specific instructions. While your treatment has been planned according to the most current medical practices available, unavoidable complications occasionally occur. If you have any problems or questions after discharge, please call your doctor. ACTIVITY You may resume your regular activity but move at a slower pace for the next 24 hours.  Take frequent rest periods for the next 24 hours.  Walking will help expel (get rid of) the air and reduce the bloated feeling in your abdomen.  No driving for 24 hours (because of the anesthesia (medicine) used during the test).  You may shower.  Do not sign any important legal documents or operate any machinery for 24 hours (because of the anesthesia used during the test).  NUTRITION Drink plenty of fluids.  You may resume your normal diet.  Begin with a light meal and progress to your normal diet.  Avoid alcoholic beverages for 24 hours or as instructed by your caregiver.  MEDICATIONS You may resume your normal medications unless your caregiver tells you otherwise.  WHAT YOU CAN EXPECT TODAY You may experience abdominal discomfort such as a feeling of fullness or "gas" pains.  FOLLOW-UP Your doctor will discuss the results of your test with you.  SEEK IMMEDIATE MEDICAL ATTENTION IF ANY OF THE FOLLOWING OCCUR: Excessive nausea (feeling sick to your stomach) and/or vomiting.  Severe abdominal pain and distention (swelling).  Trouble swallowing.  Temperature over 101 F (37.8 C).  Rectal bleeding or vomiting of blood.    Colonoscopy Discharge Instructions  Read the instructions outlined below and refer to this sheet in the next few weeks. These discharge instructions provide you with  general information on caring for yourself after you leave the hospital. Your doctor may also give you specific instructions. While your treatment has been planned according to the most current medical practices available, unavoidable complications occasionally occur.   ACTIVITY You may resume your regular activity, but move at a slower pace for the next 24 hours.  Take frequent rest periods for the next 24 hours.  Walking will help get rid of the air and reduce the bloated feeling in your belly (abdomen).  No driving for 24 hours (because of the medicine (anesthesia) used during the test).   Do not sign any important legal documents or operate any machinery for 24 hours (because of the anesthesia used during the test).  NUTRITION Drink plenty of fluids.  You may resume your normal diet as instructed by your doctor.  Begin with a light meal and progress to your normal diet. Heavy or fried foods are harder to digest and may make you feel sick to your stomach (nauseated).  Avoid alcoholic beverages for 24 hours or as instructed.  MEDICATIONS You may resume your normal medications unless your doctor tells you otherwise.  WHAT YOU CAN EXPECT TODAY Some feelings of bloating in the abdomen.  Passage of more gas than usual.  Spotting of blood in your stool or on the toilet paper.  IF YOU HAD POLYPS REMOVED DURING THE COLONOSCOPY: No aspirin products for 7 days or as instructed.  No alcohol for 7 days or as instructed.  Eat a soft diet for the next 24 hours.  FINDING OUT THE RESULTS OF YOUR TEST Not all test results are available  during your visit. If your test results are not back during the visit, make an appointment with your caregiver to find out the results. Do not assume everything is normal if you have not heard from your caregiver or the medical facility. It is important for you to follow up on all of your test results.  SEEK IMMEDIATE MEDICAL ATTENTION IF: You have more than a spotting of  blood in your stool.  Your belly is swollen (abdominal distention).  You are nauseated or vomiting.  You have a temperature over 101.  You have abdominal pain or discomfort that is severe or gets worse throughout the day.   Your EGD revealed mild amount inflammation in your stomach.  I took biopsies of this to rule out infection with a bacteria called H. pylori.  Await pathology results, my office will contact you.  You did have a tightening of your esophagus which I stretched with a balloon today.  Hopefully this helps with your swallowing.  Continue on omeprazole 20 mg twice daily.  Your colonoscopy revealed 2 polyp(s) which I removed successfully. Await pathology results, my office will contact you. I recommend repeating colonoscopy in 5 years for surveillance purposes. You also have diverticulosis and internal hemorrhoids. I would recommend increasing fiber in your diet or adding OTC Benefiber/Metamucil. Be sure to drink at least 4 to 6 glasses of water daily.   Follow-up with GI in 4-5 months  I hope you have a great rest of your week!  Elon Alas. Abbey Chatters, D.O. Gastroenterology and Hepatology [facility]

## 2021-09-11 NOTE — Interval H&P Note (Signed)
History and Physical Interval Note:  09/11/2021 10:41 AM  Jamie Hunt  has presented today for surgery, with the diagnosis of h/o colon polyps, dysphagia, gerd.  The various methods of treatment have been discussed with the patient and family. After consideration of risks, benefits and other options for treatment, the patient has consented to  Procedure(s) with comments: COLONOSCOPY WITH PROPOFOL (N/A) - 10:45am ESOPHAGOGASTRODUODENOSCOPY (EGD) WITH PROPOFOL (N/A) BALLOON DILATION (N/A) as a surgical intervention.  The patient's history has been reviewed, patient examined, no change in status, stable for surgery.  I have reviewed the patient's chart and labs.  Questions were answered to the patient's satisfaction.     Eloise Harman

## 2021-09-11 NOTE — Op Note (Signed)
[facility] Patient Name: Jamie Hunt Procedure Date: 09/11/2021 10:44 AM MRN: [id number] Date of Birth: [birth year] Attending MD: Elon Alas. Abbey Chatters DO CSN: [id number] Age: 70 Admit Type: Outpatient Procedure:                Upper GI endoscopy Indications:              Dysphagia, Heartburn Providers:                Elon Alas. Abbey Chatters, DO, Jessica Boudreaux, Randa Spike, Technician Referring MD:              Medicines:                See the Anesthesia note for documentation of the                            administered medications Complications:            No immediate complications. Estimated Blood Loss:     Estimated blood loss was minimal. Procedure:                Pre-Anesthesia Assessment:                           - The anesthesia plan was to use monitored                            anesthesia care (MAC).                           After obtaining informed consent, the endoscope was                            passed under direct vision. Throughout the                            procedure, the patient's blood pressure, pulse, and                            oxygen saturations were monitored continuously. The                            GIF-H190 ([id number]) scope was introduced through the                            mouth, and advanced to the second part of duodenum.                            The upper GI endoscopy was accomplished without                            difficulty. The patient tolerated the procedure                            well. Scope In: 10:53:43 AM Scope Out:  10:59:07 AM Total Procedure Duration: 0 hours 5 minutes 24 seconds  Findings:      A mild Schatzki ring was found in the distal esophagus. A TTS dilator       was passed through the scope. Dilation with an 18-19-20 mm balloon       dilator was performed to 20 mm. The dilation site was examined and       showed mild mucosal disruption and moderate improvement in luminal        narrowing.      Localized mild inflammation characterized by erythema was found in the       gastric antrum. Biopsies were taken with a cold forceps for Helicobacter       pylori testing.      The duodenal bulb, first portion of the duodenum and second portion of       the duodenum were normal. Impression:               - Mild Schatzki ring. Dilated.                           - Gastritis. Biopsied.                           - Normal duodenal bulb, first portion of the                            duodenum and second portion of the duodenum. Moderate Sedation:      Per Anesthesia Care Recommendation:           - Patient has a contact number available for                            emergencies. The signs and symptoms of potential                            delayed complications were discussed with the                            patient. Return to normal activities tomorrow.                            Written discharge instructions were provided to the                            patient.                           - Resume previous diet.                           - Continue present medications.                           - Await pathology results.                           - Repeat upper endoscopy PRN for retreatment.                           -  Use Prilosec (omeprazole) 20 mg PO BID.                           - Return to GI clinic in 4 months. Procedure Code(s):        --- Professional ---                           [phone], Esophagogastroduodenoscopy, flexible,                            transoral; with transendoscopic balloon dilation of                            esophagus (less than 30 mm diameter)                           43239, 59, Esophagogastroduodenoscopy, flexible,                            transoral; with biopsy, single or multiple Diagnosis Code(s):        --- Professional ---                           K22.2, Esophageal obstruction                           K29.70,  Gastritis, unspecified, without bleeding                           R13.10, Dysphagia, unspecified                           R12, Heartburn CPT copyright 2019 [employer]. All rights reserved. The codes documented in this report are preliminary and upon coder review may  be revised to meet current compliance requirements. Elon Alas. Abbey Chatters, DO [place] Abbey Chatters, DO 09/11/2021 11:01:20 AM This report has been signed electronically. Number of Addenda: 0

## 2021-09-11 NOTE — Anesthesia Postprocedure Evaluation (Signed)
Anesthesia Post Note  Patient: Valda Favia  Procedure(s) Performed: COLONOSCOPY WITH PROPOFOL ESOPHAGOGASTRODUODENOSCOPY (EGD) WITH PROPOFOL BALLOON DILATION BIOPSY  Patient location during evaluation: Phase II Anesthesia Type: General Level of consciousness: awake Pain management: pain level controlled Vital Signs Assessment: post-procedure vital signs reviewed and stable Respiratory status: spontaneous breathing and respiratory function stable Cardiovascular status: blood pressure returned to baseline and stable Postop Assessment: no headache and no apparent nausea or vomiting Anesthetic complications: no Comments: Late entry   No notable events documented.   Last Vitals:  Vitals:   09/11/21 0940 09/11/21 1124  BP: (!) 147/78 119/75  Pulse: 81 81  Resp: 14 14  Temp: 36.8 C   SpO2: 97%     Last Pain:  Vitals:   09/11/21 1124  TempSrc: Oral  PainSc: 0-No pain                 Louann Sjogren

## 2021-09-11 NOTE — Op Note (Signed)
[facility] Patient Name: Jamie Hunt Procedure Date: 09/11/2021 11:01 AM MRN: [id number] Date of Birth: 07/29/1951 Attending MD: Elon Alas. Abbey Chatters DO CSN: [id number] Age: 70 Admit Type: Outpatient Procedure:                Colonoscopy Indications:              High risk colon cancer surveillance: Personal                            history of colonic polyps Providers:                Elon Alas. Abbey Chatters, DO, Jessica Boudreaux, Randa Spike, Technician Referring MD:              Medicines:                See the Anesthesia note for documentation of the                            administered medications Complications:            No immediate complications. Estimated Blood Loss:     Estimated blood loss was minimal. Procedure:                Pre-Anesthesia Assessment:                           - The anesthesia plan was to use monitored                            anesthesia care (MAC).                           After obtaining informed consent, the colonoscope                            was passed under direct vision. Throughout the                            procedure, the patient's blood pressure, pulse, and                            oxygen saturations were monitored continuously. The                            [id number]-HQ190L ([id number]) scope was introduced through                            the anus and advanced to the the cecum, identified                            by appendiceal orifice and ileocecal valve. The                            colonoscopy was technically difficult and complex  due to a redundant colon and significant looping.                            Successful completion of the procedure was aided by                            applying abdominal pressure. The patient tolerated                            the procedure well. The quality of the bowel                            preparation was evaluated using the  BBPS [facility]                            Bowel Preparation Scale) with scores of: Right                            Colon = 2 (minor amount of residual staining, small                            fragments of stool and/or opaque liquid, but mucosa                            seen well), Transverse Colon = 3 (entire mucosa                            seen well with no residual staining, small                            fragments of stool or opaque liquid) and Left Colon                            = 3 (entire mucosa seen well with no residual                            staining, small fragments of stool or opaque                            liquid). The total BBPS score equals 8. The quality                            of the bowel preparation was good. Scope In: 11:02:45 AM Scope Out: 03:00:92 AM Scope Withdrawal Time: 0 hours 10 minutes 38 seconds  Total Procedure Duration: 0 hours 15 minutes 26 seconds  Findings:      Non-bleeding internal hemorrhoids were found during endoscopy.      Multiple medium-mouthed diverticula were found in the transverse colon       and ascending colon.      Two sessile polyps were found in the ascending colon and cecum. The       polyps were 7 to 9 mm in size. These polyps were removed with a cold  snare. Resection and retrieval were complete.      The exam was otherwise without abnormality. Impression:               - Non-bleeding internal hemorrhoids.                           - Diverticulosis in the transverse colon and in the                            ascending colon.                           - Two 7 to 9 mm polyps in the ascending colon and                            in the cecum, removed with a cold snare. Resected                            and retrieved.                           - The examination was otherwise normal. Moderate Sedation:      Per Anesthesia Care Recommendation:           - Patient has a contact number available for                             emergencies. The signs and symptoms of potential                            delayed complications were discussed with the                            patient. Return to normal activities tomorrow.                            Written discharge instructions were provided to the                            patient.                           - Resume previous diet.                           - Continue present medications.                           - Await pathology results.                           - Repeat colonoscopy in 5 years for surveillance.                           - Return to GI clinic in 4 months. Procedure Code(s):        --- Professional ---  45385, Colonoscopy, flexible; with removal of                            tumor(s), polyp(s), or other lesion(s) by snare                            technique Diagnosis Code(s):        --- Professional ---                           Z86.010, Personal history of colonic polyps                           K64.8, Other hemorrhoids                           K63.5, Polyp of colon                           K57.30, Diverticulosis of large intestine without                            perforation or abscess without bleeding CPT copyright 2019 American Medical Association. All rights reserved. The codes documented in this report are preliminary and upon coder review may  be revised to meet current compliance requirements. Elon Alas. Abbey Chatters, DO [place] Abbey Chatters, DO 09/11/2021 11:21:47 AM This report has been signed electronically. Number of Addenda: 0

## 2021-09-11 NOTE — Transfer of Care (Signed)
Immediate Anesthesia Transfer of Care Note  Patient: Jamie Hunt  Procedure(s) Performed: COLONOSCOPY WITH PROPOFOL ESOPHAGOGASTRODUODENOSCOPY (EGD) WITH PROPOFOL BALLOON DILATION BIOPSY  Patient Location: Endoscopy Unit  Anesthesia Type:General  Level of Consciousness: awake  Airway & Oxygen Therapy: Patient Spontanous Breathing  Post-op Assessment: Report given to RN and Post -op Vital signs reviewed and stable  Post vital signs: Reviewed and stable  Last Vitals:  Vitals Value Taken Time  BP 119/75 09/11/21 1124  Temp    Pulse 81 09/11/21 1124  Resp 14 09/11/21 1124  SpO2      Last Pain:  Vitals:   09/11/21 1124  TempSrc: Oral  PainSc: 0-No pain      Patients Stated Pain Goal: 4 (83/46/21 9471)  Complications: No notable events documented.

## 2021-09-11 NOTE — Anesthesia Preprocedure Evaluation (Signed)
Anesthesia Evaluation  Patient identified by MRN, date of birth, ID band Patient awake    Reviewed: Allergy & Precautions, H&P , NPO status , Patient's Chart, lab work & pertinent test results, reviewed documented beta blocker date and time   History of Anesthesia Complications (+) PONV  Airway Mallampati: II  TM Distance: >3 FB Neck ROM: full    Dental no notable dental hx.    Pulmonary neg pulmonary ROS,    Pulmonary exam normal breath sounds clear to auscultation       Cardiovascular Exercise Tolerance: Good hypertension, negative cardio ROS   Rhythm:regular Rate:Normal     Neuro/Psych negative neurological ROS  negative psych ROS   GI/Hepatic negative GI ROS, Neg liver ROS, GERD  Medicated,  Endo/Other  negative endocrine ROS  Renal/GU negative Renal ROS  negative genitourinary   Musculoskeletal   Abdominal   Peds  Hematology negative hematology ROS (+) Blood dyscrasia, anemia ,   Anesthesia Other Findings   Reproductive/Obstetrics negative OB ROS                             Anesthesia Physical Anesthesia Plan  ASA: 2  Anesthesia Plan: General   Post-op Pain Management:    Induction:   PONV Risk Score and Plan: Propofol infusion  Airway Management Planned:   Additional Equipment:   Intra-op Plan:   Post-operative Plan:   Informed Consent: I have reviewed the patients History and Physical, chart, labs and discussed the procedure including the risks, benefits and alternatives for the proposed anesthesia with the patient or authorized representative who has indicated his/her understanding and acceptance.     Dental Advisory Given  Plan Discussed with: CRNA  Anesthesia Plan Comments:         Anesthesia Quick Evaluation

## 2021-09-14 LAB — SURGICAL PATHOLOGY

## 2021-09-16 ENCOUNTER — Encounter (HOSPITAL_COMMUNITY): Payer: Self-pay | Admitting: Internal Medicine

## 2021-09-16 DIAGNOSIS — M1990 Unspecified osteoarthritis, unspecified site: Secondary | ICD-10-CM | POA: Diagnosis not present

## 2021-09-16 DIAGNOSIS — I10 Essential (primary) hypertension: Secondary | ICD-10-CM | POA: Diagnosis not present

## 2021-10-13 ENCOUNTER — Ambulatory Visit: Payer: PPO | Admitting: Orthopaedic Surgery

## 2021-10-13 ENCOUNTER — Ambulatory Visit: Payer: PPO

## 2021-10-13 ENCOUNTER — Encounter: Payer: Self-pay | Admitting: Orthopaedic Surgery

## 2021-10-13 ENCOUNTER — Other Ambulatory Visit: Payer: Self-pay

## 2021-10-13 VITALS — BP 155/81 | HR 71 | Ht 71.0 in | Wt 212.8 lb

## 2021-10-13 DIAGNOSIS — G8929 Other chronic pain: Secondary | ICD-10-CM

## 2021-10-13 DIAGNOSIS — M25552 Pain in left hip: Secondary | ICD-10-CM | POA: Diagnosis not present

## 2021-10-13 MED ORDER — HYDROCODONE-ACETAMINOPHEN 5-325 MG PO TABS: ORAL_TABLET | ORAL | 0 refills | Status: DC

## 2021-10-13 NOTE — Progress Notes (Signed)
My thigh hurts  She has had left thigh, hip and knee pain for over two years.  It is getting worse.  She has more pain when first getting up from a seated position.  It takes her a few seconds to get going.  She has no trauma, no numbness.  She takes Tylenol for pain which helps a little.  She started using a cane.  She has pain rolling on the left hip at night.  She has no weakness.  ROM of the left hip is decreased, flexion 90, internal 5, external 10, abduction 20, adduction 20, limp to the left.  Knee has ROM 0 to 105 with effusion and crepitus but knee is stable.  NV intact.  Encounter Diagnosis  Name Primary?   Chronic left hip pain Yes   X-rays were done of the left hip, reported separately. She has significant degenerative changes of the left hip.   I will get MRI.  I have told her about possible total hip replacement.  I will begin Vicodin.  I have reviewed the [place] web site prior to prescribing narcotic medicine for this patient.  Return in three weeks.  Call if any problem.  Precautions discussed.  Electronically Signed Sanjuana Kava, MD 1/3/20231:53 PM

## 2021-10-14 NOTE — Addendum Note (Signed)
Addended by: Elizabeth Sauer on: 10/14/2021 08:39 AM   Modules accepted: Orders

## 2021-10-17 DIAGNOSIS — E785 Hyperlipidemia, unspecified: Secondary | ICD-10-CM | POA: Diagnosis not present

## 2021-10-17 DIAGNOSIS — I10 Essential (primary) hypertension: Secondary | ICD-10-CM | POA: Diagnosis not present

## 2021-10-23 ENCOUNTER — Ambulatory Visit (HOSPITAL_COMMUNITY)
Admission: RE | Admit: 2021-10-23 | Discharge: 2021-10-23 | Disposition: A | Discharge status: Home / Self Care | Payer: PPO | Source: Ambulatory Visit | Attending: Orthopaedic Surgery | Admitting: Orthopaedic Surgery

## 2021-10-23 ENCOUNTER — Other Ambulatory Visit: Payer: Self-pay

## 2021-10-23 DIAGNOSIS — G8929 Other chronic pain: Secondary | ICD-10-CM | POA: Insufficient documentation

## 2021-10-23 DIAGNOSIS — M25552 Pain in left hip: Secondary | ICD-10-CM | POA: Insufficient documentation

## 2021-10-23 DIAGNOSIS — R600 Localized edema: Secondary | ICD-10-CM | POA: Diagnosis not present

## 2021-10-23 DIAGNOSIS — M1612 Unilateral primary osteoarthritis, left hip: Secondary | ICD-10-CM | POA: Diagnosis not present

## 2021-11-03 ENCOUNTER — Ambulatory Visit: Payer: PPO | Admitting: Orthopaedic Surgery

## 2021-11-03 ENCOUNTER — Other Ambulatory Visit: Payer: Self-pay

## 2021-11-03 ENCOUNTER — Encounter: Payer: Self-pay | Admitting: Orthopaedic Surgery

## 2021-11-03 DIAGNOSIS — G8929 Other chronic pain: Secondary | ICD-10-CM

## 2021-11-03 DIAGNOSIS — M160 Bilateral primary osteoarthritis of hip: Secondary | ICD-10-CM

## 2021-11-03 NOTE — Progress Notes (Signed)
My hip hurts more.  She had MRI of the hip on the left showing: IMPRESSION: 1. Severe osteoarthritis of the left hip. 2. Mild-moderate osteoarthritis of the right hip with trace right peritrochanteric bursal fluid.   I have explained the findings to her.  I have recommended consideration of a total hip.  I will have her see Dr. Ninfa Linden. She agrees.  I have independently reviewed the MRI.    She uses a cane.  She has limited motion in the left hip with limp to the left.  NV intact.  Encounter Diagnosis  Name Primary?   Chronic left hip pain Yes   To see Dr. Ninfa Linden for consideration of a total hip.  Call if any problem.  Precautions discussed.  Electronically Signed Sanjuana Kava, MD 1/24/202310:24 AM

## 2021-11-17 DIAGNOSIS — I10 Essential (primary) hypertension: Secondary | ICD-10-CM | POA: Diagnosis not present

## 2021-11-17 DIAGNOSIS — M1990 Unspecified osteoarthritis, unspecified site: Secondary | ICD-10-CM | POA: Diagnosis not present

## 2021-11-23 ENCOUNTER — Ambulatory Visit: Payer: PPO | Admitting: Orthopaedic Surgery

## 2021-11-23 ENCOUNTER — Encounter: Payer: Self-pay | Admitting: Orthopaedic Surgery

## 2021-11-23 ENCOUNTER — Other Ambulatory Visit: Payer: Self-pay

## 2021-11-23 VITALS — Ht 71.0 in | Wt 210.0 lb

## 2021-11-23 DIAGNOSIS — G8929 Other chronic pain: Secondary | ICD-10-CM | POA: Diagnosis not present

## 2021-11-23 DIAGNOSIS — M25552 Pain in left hip: Secondary | ICD-10-CM

## 2021-11-23 DIAGNOSIS — M1612 Unilateral primary osteoarthritis, left hip: Secondary | ICD-10-CM

## 2021-11-23 NOTE — Progress Notes (Signed)
Office Visit Note   Patient: Jamie Hunt           Date of Birth: Sep 29, 1951           MRN: [id number] Visit Date: 11/23/2021              Requested by: Sanjuana Kava, MD [address] [place],  [place] [address] PCP: Carrolyn Meiers, MD   Assessment & Plan: Visit Diagnoses:  1. Chronic left hip pain   2. Unilateral primary osteoarthritis, left hip     Plan: I did go over the patient's x-rays and clinical exam with her and her daughter.  She has severe end-stage arthritis of her left hip and I agree that a hip replacement would be helpful for her.  We talked about the risks and benefits of the surgery and what to expect with an intraoperative and postoperative course.  I showed her hip replacement model and gave her handout describing what is involved as well.  They are interested in scheduling surgery.  All questions and concerns were answered and addressed.  We will be in touch with the schedule.  Follow-Up Instructions: Return for 2 weeks post-op.   Orders:  No orders of the defined types were placed in this encounter.  No orders of the defined types were placed in this encounter.     Procedures: No procedures performed   Clinical Data: No additional findings.   Subjective: Chief Complaint  Patient presents with   Left Hip - Pain  The patient is a very pleasant 71 year old female referred from Dr. Luna Glasgow from orthopedic surgery in [place] to evaluate and treat severe osteoarthritis of the left hip.  She does ambulate with a cane.  Her daughter is with her today.  She has daily left hip pain that is getting worse.  It can be 10 out of 10 at times and has been getting worse for over a year now.  It is definitely affecting her mobility, her quality of life and her actives daily living.  Both plain films and MRI of her left hip confirm severe osteoarthritis.  It has been getting worse for over a year now.  She is interested in hip replacement surgery at  this standpoint.  She is not a diabetic.  She does have high blood pressure but reports good control and is compliant with her medications.  She does live alone in [place] and her daughter lives in [place].  HPI  Review of Systems There is currently listed no headache, chest pain, shortness of breath, fever, chills, nausea, vomiting  Objective: Vital Signs: Ht 5\' 11"  (1.803 m)    Wt 210 lb (95.3 kg)    BMI 29.29 kg/m   Physical Exam She is alert and orient x3 and in no acute distress Ortho Exam Examination of her left hip shows significant stiffness with internal and external rotation as well as severe pain in the groin with rotation.  She is ambulating with a cane.  Her right hip moves more smoothly. Specialty Comments:  No specialty comments available.  Imaging: No results found. X-rays on the canopy system of her pelvis and left hip shows severe end-stage arthritis left hip.  There is significant loss of the joint space as well as particular osteophytes and cystic changes.  The right hip has a well-maintained joint space.  PMFS History: Patient Active Problem List   Diagnosis Date Noted   Unilateral primary osteoarthritis, left hip 11/23/2021   GERD (gastroesophageal reflux  disease) 08/14/2021   H/O adenomatous polyp of colon 08/14/2021   Dysphagia 08/14/2021   Gastritis and gastroduodenitis 05/19/2016   Normocytic anemia 03/03/2016   Rectal bleeding 05/28/2014   Abdominal pain, bilateral upper quadrant 05/28/2014   Past Medical History:  Diagnosis Date   Acid reflux    Anemia    Arthritis    High cholesterol    Hypertension    PONV (postoperative nausea and vomiting)    Shingles     Family History  Problem Relation Age of Onset   Colon cancer Neg Hx    Liver disease Neg Hx     Past Surgical History:  Procedure Laterality Date   ABDOMINAL HYSTERECTOMY     BACK SURGERY     BALLOON DILATION N/A 09/11/2021   Procedure: BALLOON DILATION;  Surgeon: Eloise Harman, DO;  Location[facility];  Service: Endoscopy;  Laterality: N/A;   BIOPSY  09/11/2021   Procedure: BIOPSY;  Surgeon: Eloise Harman, DO;  Location: [facility];  Service: Endoscopy;;   CATARACT EXTRACTION W/PHACO Right 05/18/2019   Procedure: CATARACT EXTRACTION PHACO AND INTRAOCULAR LENS PLACEMENT (IOC);  Surgeon: [name], MD;  Location: [facility];  Service: Ophthalmology;  Laterality: Right;  CDE: 6.76   CATARACT EXTRACTION W/PHACO Left 06/01/2019   Procedure: CATARACT EXTRACTION PHACO AND INTRAOCULAR LENS PLACEMENT (IOC);  Surgeon: [name], MD;  Location: [facility];  Service: Ophthalmology;  Laterality: Left;  left, CDE: 8.69   CHOLECYSTECTOMY     COLONOSCOPY  05/08/2009   SLF:A few scattered diverticula throughout the entire colon.  Small  internal hemorrhoids.  Otherwise, no polyps, masses, inflammatory changes, or AVMs seen.  Normal retroflexed view of the rectum   COLONOSCOPY N/A 06/03/2014   SLF:  1. six colon polyps removed. 2. Moderate sized internal hemorrhoids. 3. The left colon is redundant 4.  Moderate diverticulosis throughout the entire examed colon. next TCS in10 years with overtube.   COLONOSCOPY WITH PROPOFOL N/A 09/11/2021   Procedure: COLONOSCOPY WITH PROPOFOL;  Surgeon: Eloise Harman, DO;  [facility];  Service: Endoscopy;  Laterality: N/A;  10:45am   ESOPHAGOGASTRODUODENOSCOPY N/A 06/03/2014   SLF:  1. small hiatal hernia   ESOPHAGOGASTRODUODENOSCOPY N/A 02/16/2016   slf: gastritis, schazki ring   ESOPHAGOGASTRODUODENOSCOPY (EGD) WITH PROPOFOL N/A 09/11/2021   Procedure: ESOPHAGOGASTRODUODENOSCOPY (EGD) WITH PROPOFOL;  Surgeon: Eloise Harman, DO;  [facility];  Service: Endoscopy;  Laterality: N/A;   GIVENS CAPSULE STUDY N/A 03/19/2016   Procedure: GIVENS CAPSULE STUDY;  Surgeon: Danie Binder, MD;  Location: [facility];  Service: Endoscopy;  Laterality: N/A;  0700   VASCULAR SURGERY     varicose veins   Social  History   Occupational History   Not on file  Tobacco Use   Smoking status: Never   Smokeless tobacco: Never   Tobacco comments:    Never smoked  Vaping Use   Vaping Use: Never used  Substance and Sexual Activity   Alcohol use: No    Alcohol/week: 0.0 standard drinks   Drug use: No   Sexual activity: Not on file

## 2021-12-01 ENCOUNTER — Emergency Department (HOSPITAL_COMMUNITY): Payer: PPO

## 2021-12-01 ENCOUNTER — Encounter (HOSPITAL_COMMUNITY): Payer: Self-pay | Admitting: *Deleted

## 2021-12-01 ENCOUNTER — Emergency Department (HOSPITAL_COMMUNITY)
Admission: EM | Admit: 2021-12-01 | Discharge: 2021-12-01 | Disposition: A | Discharge status: Home / Self Care | Payer: PPO | Attending: Emergency Medicine | Admitting: Emergency Medicine

## 2021-12-01 ENCOUNTER — Other Ambulatory Visit: Payer: Self-pay

## 2021-12-01 DIAGNOSIS — R1030 Lower abdominal pain, unspecified: Secondary | ICD-10-CM | POA: Diagnosis not present

## 2021-12-01 DIAGNOSIS — R112 Nausea with vomiting, unspecified: Secondary | ICD-10-CM | POA: Insufficient documentation

## 2021-12-01 DIAGNOSIS — Z9049 Acquired absence of other specified parts of digestive tract: Secondary | ICD-10-CM | POA: Diagnosis not present

## 2021-12-01 DIAGNOSIS — K769 Liver disease, unspecified: Secondary | ICD-10-CM | POA: Diagnosis not present

## 2021-12-01 DIAGNOSIS — Z7982 Long term (current) use of aspirin: Secondary | ICD-10-CM | POA: Insufficient documentation

## 2021-12-01 DIAGNOSIS — R197 Diarrhea, unspecified: Secondary | ICD-10-CM | POA: Insufficient documentation

## 2021-12-01 DIAGNOSIS — K5730 Diverticulosis of large intestine without perforation or abscess without bleeding: Secondary | ICD-10-CM | POA: Diagnosis not present

## 2021-12-01 LAB — COMPREHENSIVE METABOLIC PANEL
ALT: 20 U/L (ref 0–44)
AST: 20 U/L (ref 15–41)
Albumin: 4.1 g/dL (ref 3.5–5.0)
Alkaline Phosphatase: 58 U/L (ref 38–126)
Anion gap: 10 (ref 5–15)
BUN: 15 mg/dL (ref 8–23)
CO2: 26 mmol/L (ref 22–32)
Calcium: 9.4 mg/dL (ref 8.9–10.3)
Chloride: 103 mmol/L (ref 98–111)
Creatinine, Ser: 1.07 mg/dL — ABNORMAL HIGH (ref 0.44–1.00)
GFR, Estimated: 56 mL/min — ABNORMAL LOW (ref >60)
Glucose, Bld: 112 mg/dL — ABNORMAL HIGH (ref 70–99)
Potassium: 3.5 mmol/L (ref 3.5–5.1)
Sodium: 139 mmol/L (ref 135–145)
Total Bilirubin: 1 mg/dL (ref 0.3–1.2)
Total Protein: 7.6 g/dL (ref 6.5–8.1)

## 2021-12-01 LAB — CBC WITH DIFFERENTIAL/PLATELET
Abs Immature Granulocytes: 0.01 10*3/uL (ref 0.00–0.07)
Basophils Absolute: 0 10*3/uL (ref 0.0–0.1)
Basophils Relative: 0 %
Eosinophils Absolute: 0 10*3/uL (ref 0.0–0.5)
Eosinophils Relative: 0 %
HCT: 34.9 % — ABNORMAL LOW (ref 36.0–46.0)
Hemoglobin: 12.1 g/dL (ref 12.0–15.0)
Immature Granulocytes: 0 %
Lymphocytes Relative: 6 %
Lymphs Abs: 0.4 10*3/uL — ABNORMAL LOW (ref 0.7–4.0)
MCH: 34.5 pg — ABNORMAL HIGH (ref 26.0–34.0)
MCHC: 34.7 g/dL (ref 30.0–36.0)
MCV: 99.4 fL (ref 80.0–100.0)
Monocytes Absolute: 0.4 10*3/uL (ref 0.1–1.0)
Monocytes Relative: 5 %
Neutro Abs: 6.2 10*3/uL (ref 1.7–7.7)
Neutrophils Relative %: 89 %
Platelets: 242 10*3/uL (ref 150–400)
RBC: 3.51 MIL/uL — ABNORMAL LOW (ref 3.87–5.11)
RDW: 12.7 % (ref 11.5–15.5)
WBC: 7 10*3/uL (ref 4.0–10.5)
nRBC: 0 % (ref 0.0–0.2)

## 2021-12-01 LAB — URINALYSIS, ROUTINE W REFLEX MICROSCOPIC
Bilirubin Urine: NEGATIVE
Glucose, UA: NEGATIVE mg/dL
Hgb urine dipstick: NEGATIVE
Ketones, ur: NEGATIVE mg/dL
Nitrite: NEGATIVE
Protein, ur: NEGATIVE mg/dL
Specific Gravity, Urine: 1.01 (ref 1.005–1.030)
pH: 7 (ref 5.0–8.0)

## 2021-12-01 LAB — LIPASE, BLOOD: Lipase: 34 U/L (ref 11–51)

## 2021-12-01 MED ORDER — SODIUM CHLORIDE 0.9 % IV BOLUS
500.0000 mL | Freq: Once | INTRAVENOUS | Status: AC
  Administered 2021-12-01: 500 mL via INTRAVENOUS

## 2021-12-01 MED ORDER — IOHEXOL 300 MG/ML  SOLN
100.0000 mL | Freq: Once | INTRAMUSCULAR | Status: AC | PRN
  Administered 2021-12-01: 100 mL via INTRAVENOUS

## 2021-12-01 MED ORDER — ONDANSETRON HCL 4 MG PO TABS: 4.0000 mg | ORAL_TABLET | Freq: Three times a day (TID) | ORAL | 0 refills | Status: DC | PRN

## 2021-12-01 MED ORDER — ONDANSETRON HCL 4 MG/2ML IJ SOLN
4.0000 mg | Freq: Once | INTRAMUSCULAR | Status: AC
  Administered 2021-12-01: 4 mg via INTRAVENOUS
  Filled 2021-12-01: qty 2

## 2021-12-01 MED ORDER — KETOROLAC TROMETHAMINE 30 MG/ML IJ SOLN
30.0000 mg | Freq: Once | INTRAMUSCULAR | Status: AC
  Administered 2021-12-01: 30 mg via INTRAVENOUS
  Filled 2021-12-01: qty 1

## 2021-12-01 NOTE — ED Triage Notes (Signed)
Pt c/o n/v/d and some cramping in lower abdomen since last night.

## 2021-12-01 NOTE — Discharge Instructions (Addendum)
You have been seen and discharged from the emergency department.  Your blood work and CAT scan showed no acute finding.  You are most likely suffering from a viral gastroenteritis/stomach flu.  Anticipate symptoms may last 48 to 72 hours.  Use prescriptive medication for symptom control.  Stay well-hydrated.  Follow a bland diet.  Follow-up with your primary provider for further evaluation and further care. Take home medications as prescribed. If you have any worsening symptoms or further concerns for your health please return to an emergency department for further evaluation.

## 2021-12-01 NOTE — ED Provider Notes (Signed)
[facility] Provider Note   CSN: [id number] Arrival date & time: 12/01/21  3532     History  Chief Complaint  Patient presents with   Emesis    Jamie Hunt is a 71 y.o. female.  HPI  71 year old female past medical history of cholecystectomy, hysterectomy presents to the emergency department with concern for nausea/vomiting/diarrhea and lower abdominal cramping.  Patient states that this started last night around 1 AM.  Started with nausea/vomiting and progressed to diarrhea and abdominal cramping.  She denies any blood in the emesis/stools.  Denies any documented fever but endorses chills and fatigue.  No recent sick contacts or traveling.  No genitourinary symptoms.  Home Medications Prior to Admission medications   Medication Sig Start Date End Date Taking? Authorizing Provider  acetaminophen (TYLENOL) 650 MG CR tablet Take 650 mg by mouth every 8 (eight) hours as needed for pain.    [provider]  aspirin EC 81 MG tablet Take 81 mg by mouth in the morning.    [provider]  CALCIUM PO Take 1 tablet by mouth in the morning.    [provider]  cholecalciferol (VITAMIN D3) 25 MCG (1000 UT) tablet Take 1,000 Units by mouth in the morning.    [provider]  docusate sodium (COLACE) 100 MG capsule Take 100 mg by mouth 2 (two) times daily.    [provider]  escitalopram (LEXAPRO) 10 MG tablet Take 10 mg by mouth at bedtime. 06/21/21   [provider]  fluticasone (FLONASE) 50 MCG/ACT nasal spray Place 1 spray into both nostrils daily for 10 days. Patient taking differently: Place 1 spray into both nostrils daily as needed for allergies. 09/01/17 09/07/24  Evalee Jefferson, PA-C  hydrALAZINE (APRESOLINE) 50 MG tablet Take 50 mg by mouth 2 (two) times a day.  01/24/18   [provider]  hydrochlorothiazide (HYDRODIURIL) 25 MG tablet Take 25 mg by mouth in the morning.    [provider]   HYDROcodone-acetaminophen (NORCO/VICODIN) 5-325 MG tablet One tablet every four hours as needed for acute pain.  Limit of five days per [place]. 10/13/21   Sanjuana Kava, MD  losartan (COZAAR) 100 MG tablet Take 100 mg by mouth in the morning. 10/27/15   [provider]  lovastatin (MEVACOR) 20 MG tablet Take 20 mg by mouth at bedtime.    [provider]  montelukast (SINGULAIR) 10 MG tablet Take 10 mg by mouth at bedtime.    [provider]  Multiple Vitamin (MULTIVITAMIN WITH MINERALS) TABS tablet Take 1 tablet by mouth in the morning.    [provider]  omeprazole (PRILOSEC) 20 MG capsule Take one capsule 30 minutes before breakfast daily. You may take a second dose before supper if needed. 08/14/21   Mahala Menghini, PA-C  Polyethyl Glycol-Propyl Glycol (SYSTANE OP) Place 1 drop into both eyes in the morning and at bedtime.    [provider]  polyethylene glycol-electrolytes (NULYTELY) 420 g solution As directed 08/14/21   Eloise Harman, DO  Potassium 99 MG TABS Take 99 mg by mouth in the morning.    [provider]  simethicone (MYLICON) 80 MG chewable tablet Chew 80-160 mg by mouth every 6 (six) hours as needed for flatulence.    [provider]  traZODone (DESYREL) 50 MG tablet Take 50 mg by mouth at bedtime. 07/18/21   [provider]  verapamil (CALAN-SR) 240 MG CR tablet Take 240 mg  by mouth at bedtime. 02/12/16   [provider]      Allergies    Codeine and Penicillins    Review of Systems   Review of Systems  Constitutional:  Positive for appetite change and chills. Negative for fever.  Respiratory:  Negative for shortness of breath.   Cardiovascular:  Negative for chest pain.  Gastrointestinal:  Positive for abdominal pain, diarrhea, nausea and vomiting.  Genitourinary:  Negative for dysuria.  Skin:  Negative for rash.  Neurological:  Negative for headaches.   Physical Exam Updated  Vital Signs BP 137/78 (BP Location: Right Arm)    Pulse 81    Temp 98.6 F (37 C) (Oral)    Resp 16    Ht 5\' 11"  (1.803 m)    Wt 95.3 kg    SpO2 96%    BMI 29.29 kg/m  Physical Exam Vitals and nursing note reviewed.  Constitutional:      Appearance: Normal appearance.  HENT:     Head: Normocephalic.     Mouth/Throat:     Mouth: Mucous membranes are moist.  Cardiovascular:     Rate and Rhythm: Normal rate.  Pulmonary:     Effort: Pulmonary effort is normal. No respiratory distress.  Abdominal:     General: There is no distension.     Palpations: Abdomen is soft.     Tenderness: There is abdominal tenderness. There is no guarding or rebound.     Comments: TTP lower quadrants  Skin:    General: Skin is warm.  Neurological:     Mental Status: She is alert and oriented to person, place, and time. Mental status is at baseline.  Psychiatric:        Mood and Affect: Mood normal.    ED Results / Procedures / Treatments   Labs (all labs ordered are listed, but only abnormal results are displayed) Labs Reviewed  CBC WITH DIFFERENTIAL/PLATELET - Abnormal; Notable for the following components:      Result Value   RBC 3.51 (*)    HCT 34.9 (*)    MCH 34.5 (*)    Lymphs Abs 0.4 (*)    All other components within normal limits  COMPREHENSIVE METABOLIC PANEL - Abnormal; Notable for the following components:   Glucose, Bld 112 (*)    Creatinine, Ser 1.07 (*)    GFR, Estimated 56 (*)    All other components within normal limits  URINALYSIS, ROUTINE W REFLEX MICROSCOPIC - Abnormal; Notable for the following components:   Leukocytes,Ua TRACE (*)    Bacteria, UA RARE (*)    All other components within normal limits  LIPASE, BLOOD    EKG None  Radiology No results found.  Procedures Procedures    Medications Ordered in ED Medications  sodium chloride 0.9 % bolus 500 mL (has no administration in time range)  ondansetron (ZOFRAN) injection 4 mg (has no administration in time  range)  ketorolac (TORADOL) 30 MG/ML injection 30 mg (has no administration in time range)    ED Course/ Medical Decision Making/ A&P                           Medical Decision Making Amount and/or Complexity of Data Reviewed Labs: ordered. Radiology: ordered.  Risk Prescription drug management.   71 year old female presents emergency department with lower abdominal cramping with nausea/vomiting/diarrhea.  This all started last night.  Vitals are normal on arrival.  She has mild discomfort in  the lower abdomen but otherwise benign exam.  Blood work is reassuring, no significant leukocytosis, belly labs are normal.  UA unremarkable. CT of the abdomen pelvis shows no acute finding.  After IV medication she feels improved.  This is most likely viral gastroenteritis, plan for symptomatic treatment and outpatient follow-up.  Patient at this time appears safe and stable for discharge and close outpatient follow up. Discharge plan and strict return to ED precautions discussed, patient verbalizes understanding and agreement.        Final Clinical Impression(s) / ED Diagnoses Final diagnoses:  None    Rx / DC Orders ED Discharge Orders     None         Lorelle Gibbs, DO 12/01/21 1119

## 2021-12-15 DIAGNOSIS — M1990 Unspecified osteoarthritis, unspecified site: Secondary | ICD-10-CM | POA: Diagnosis not present

## 2021-12-15 DIAGNOSIS — I10 Essential (primary) hypertension: Secondary | ICD-10-CM | POA: Diagnosis not present

## 2021-12-25 ENCOUNTER — Other Ambulatory Visit: Payer: Self-pay

## 2022-01-07 DIAGNOSIS — I10 Essential (primary) hypertension: Secondary | ICD-10-CM | POA: Diagnosis not present

## 2022-01-07 DIAGNOSIS — L03031 Cellulitis of right toe: Secondary | ICD-10-CM | POA: Diagnosis not present

## 2022-01-11 ENCOUNTER — Other Ambulatory Visit (HOSPITAL_COMMUNITY): Payer: Self-pay | Admitting: Internal Medicine

## 2022-01-11 DIAGNOSIS — Z1231 Encounter for screening mammogram for malignant neoplasm of breast: Secondary | ICD-10-CM

## 2022-01-28 ENCOUNTER — Other Ambulatory Visit: Payer: Self-pay | Admitting: Physician Assistant

## 2022-01-28 DIAGNOSIS — M1612 Unilateral primary osteoarthritis, left hip: Secondary | ICD-10-CM

## 2022-02-01 ENCOUNTER — Encounter: Payer: Self-pay | Admitting: Gastroenterology

## 2022-02-01 ENCOUNTER — Ambulatory Visit: Payer: PPO | Admitting: Gastroenterology

## 2022-02-01 VITALS — BP 122/74 | HR 71 | Temp 96.8°F | Ht 71.0 in | Wt 207.0 lb

## 2022-02-01 DIAGNOSIS — K2990 Gastroduodenitis, unspecified, without bleeding: Secondary | ICD-10-CM

## 2022-02-01 DIAGNOSIS — Z86010 Personal history of colonic polyps: Secondary | ICD-10-CM

## 2022-02-01 DIAGNOSIS — K219 Gastro-esophageal reflux disease without esophagitis: Secondary | ICD-10-CM

## 2022-02-01 DIAGNOSIS — K2970 Gastritis, unspecified, without bleeding: Secondary | ICD-10-CM

## 2022-02-01 NOTE — Progress Notes (Signed)
? ? ? ?GI Office Note   ? ?Referring Provider: Carrolyn Meiers* ?Primary Care Physician:  Carrolyn Meiers, MD  ?Primary Gastroenterologist: Elon Alas. Abbey Chatters, DO ? ?Chief Complaint  ? ?Chief Complaint  ?Patient presents with  ? Follow-up  ?  Pt states that she still has indigestion, but if she watches what she eats everything is fine.   ? ? ?History of Present Illness  ? ?Jamie Hunt is a 71 y.o. female presenting today for follow-up.  Patient last seen November 2022.  She has a history of GERD, dysphagia, constipation. ? ?Overall doing okay.  Certain foods still trigger the reflux/indigestion. Does ok if avoids trigger foods, like fried foods.  If she has breakthrough reflux she typically takes a second omeprazole in the evening.  She states it works pretty quickly for her.  Has not tried any antacids.  Seen in the ED back in February for acute onset vomiting and diarrhea.  Symptoms resolved after couple of days.  Work-up was unremarkable including labs and CT with contrast as outlined below.  Currently bowel movements doing okay, she maintains bowel regimen including stool softeners and daily fiber supplement.  Her weight is down a couple of pounds but she has decreased her portion size because of her GERD type symptoms.  She is scheduled for left hip replacement on Friday.   ? ? ?EGD December 2022: ?- Mild Schatzki ring. Dilated. ?- Gastritis. Biopsied.  Reactive gastropathy, no H. pylori. ?- Normal duodenal bulb, first portion of the duodenum and second portion of the ?duodenum. ? ?Colonoscopy December 2022: ?- Non-bleeding internal hemorrhoids. ?- Diverticulosis in the transverse colon and in the ascending colon. ?- Two 7 to 9 mm polyps in the ascending colon and in the cecum, removed with a cold ?snare. Resected and retrieved.  Multiple fragments of tubular adenomas with no high-grade dysplasia or malignancy ?- The examination was otherwise normal. ?-Next colonoscopy December  2027. ? 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 ?   ? ?CT A/P with contrast 11/2021 ? ?IMPRESSION: ?1. No acute findings are noted in the abdomen or pelvis to account ?for the patient's symptoms. ?2. Colonic diverticulosis without evidence of acute diverticulitis ?at this time. ?3. Aortic atherosclerosis. ?4. Additional incidental findings, as above. ?  ? ? ? ?Medications  ? ?Current Outpatient Medications  ?Medication Sig Dispense Refill  ? acetaminophen (TYLENOL) 650 MG CR tablet Take 1,300 mg by mouth every 8 (eight) hours as needed for pain.    ? aspirin EC 81 MG tablet Take 81 mg by mouth in the morning.    ? Calcium Citrate-Vitamin D (CALCIUM + D PO) Take 1 tablet by mouth daily.    ? cholecalciferol (VITAMIN D3) 25 MCG (1000 UT) tablet Take 1,000 Units by mouth in the morning.    ? docusate sodium (COLACE) 100 MG capsule Take 100 mg by mouth 2 (two) times daily.    ? escitalopram (LEXAPRO) 10 MG tablet Take 10 mg by mouth at bedtime.    ? fluticasone (FLONASE) 50 MCG/ACT nasal spray Place 1 spray into both nostrils daily for 10 days. (Patient taking differently: Place 1 spray into both nostrils daily as needed for allergies.) 16 g 0  ? hydrALAZINE (APRESOLINE) 50 MG tablet Take 50 mg by mouth 2 (two) times a day.   3  ? hydrochlorothiazide (HYDRODIURIL) 25 MG tablet Take 25 mg by mouth in the morning.    ?  losartan (COZAAR) 100 MG tablet Take 100 mg by mouth in the morning.    ? lovastatin (MEVACOR) 20 MG tablet Take 20 mg by mouth at bedtime.    ? montelukast (SINGULAIR) 10 MG tablet Take 10 mg by mouth at bedtime.    ? Multiple Vitamin (MULTIVITAMIN WITH MINERALS) TABS tablet Take 1 tablet by mouth in the morning.    ? omeprazole (PRILOSEC) 20 MG capsule Take one capsule 30 minutes before breakfast daily. You may take a second dose before supper if needed. 180 capsule 3  ? Polyethyl Glycol-Propyl Glycol  (SYSTANE OP) Place 1 drop into both eyes 3 (three) times daily.    ? Potassium 99 MG TABS Take 99 mg by mouth in the morning.    ? simethicone (MYLICON) 80 MG chewable tablet Chew 80-160 mg by mouth every 6 (six) hours as needed for flatulence.    ? traZODone (DESYREL) 50 MG tablet Take 50 mg by mouth at bedtime.    ? verapamil (CALAN-SR) 240 MG CR tablet Take 240 mg by mouth at bedtime.    ? ?No current facility-administered medications for this visit.  ? ? ?Allergies  ? ?Allergies as of 02/01/2022 - Review Complete 02/01/2022  ?Allergen Reaction Noted  ? Codeine Nausea And Vomiting 04/22/2014  ? Penicillins  11/30/2011  ? ?   ? ?Review of Systems  ? ?General: Negative for anorexia,  fever, chills, fatigue, weakness. See hpi ?ENT: Negative for hoarseness, difficulty swallowing , nasal congestion. ?CV: Negative for chest pain, angina, palpitations, dyspnea on exertion, peripheral edema.  ?Respiratory: Negative for dyspnea at rest, dyspnea on exertion, cough, sputum, wheezing.  ?GI: See history of present illness. ?GU:  Negative for dysuria, hematuria, urinary incontinence, urinary frequency, nocturnal urination.  ?Endo: Negative for unusual weight change.  ?   ?Physical Exam  ? ?BP 122/74 (BP Location: Right Arm, Patient Position: Sitting, Cuff Size: Normal)   Pulse 71   Temp (!) 96.8 ?F (36 ?C) (Temporal)   Ht '5\' 11"'$  (1.803 m)   Wt 207 lb (93.9 kg)   SpO2 97%   BMI 28.87 kg/m?  ?  ?General: Well-nourished, well-developed in no acute distress.  ?Eyes: No icterus. ?Mouth: Oropharyngeal mucosa moist and pink , no lesions erythema or exudate. ?Lungs: Clear to auscultation bilaterally.  ?Heart: Regular rate and rhythm, no murmurs rubs or gallops.  ?Abdomen: Bowel sounds are normal, nontender, nondistended, no hepatosplenomegaly or masses,  ?no abdominal bruits or hernia , no rebound or guarding. Rectus diastasis. ?Rectal: not performed  ?Extremities: No lower extremity edema. No clubbing or deformities. ?Neuro:  Alert and oriented x 4   ?Skin: Warm and dry, no jaundice.   ?Psych: Alert and cooperative, normal mood and affect. ? ?Labs  ? ?Lab Results  ?Component Value Date  ? LIPASE 34 12/01/2021  ? ?Lab Results  ?Component Value Date  ? CREATININE 1.07 (H) 12/01/2021  ? BUN 15 12/01/2021  ? NA 139 12/01/2021  ? K 3.5 12/01/2021  ? CL 103 12/01/2021  ? CO2 26 12/01/2021  ? ?Lab Results  ?Component Value Date  ? WBC 7.0 12/01/2021  ? HGB 12.1 12/01/2021  ? HCT 34.9 (L) 12/01/2021  ? MCV 99.4 12/01/2021  ? PLT 242 12/01/2021  ? ?Lab Results  ?Component Value Date  ? ALT 20 12/01/2021  ? AST 20 12/01/2021  ? ALKPHOS 58 12/01/2021  ? BILITOT 1.0 12/01/2021  ? ? ?Imaging Studies  ? ?No results found. ? ?Assessment  ? ?GERD/dysphagia: Swallowing  much improved after EGD with dilation.  Takes omeprazole every morning before breakfast.  Will take an additional dose if she has reflux after dinner but does not require this regularly.  As long as she avoids trigger foods she does well but some food she prefers not to completely eliminate.  Fried foods seem to be the worst.  We will also increase bloating.  Was seen in the emergency department in February due to acute onset vomiting and diarrhea.  Work-up unremarkable including CT with contrast. ? ?Constipation: Currently well controlled with daily fiber supplement and stool softeners.  With upcoming hip replacement surgery, likely use of narcotics, advised her to stay on top of her bowels.  She is aware that she will likely be provided suggestions at time of discharge but she could use MiraLAX daily if needed.  She is also more than welcome to call with any problems postoperatively she has any flare of her GI symptoms. ? ?History of colon polyps: Next surveillance colonoscopy in December 2027. ? ? ?PLAN  ? ?Continue omeprazole once to twice daily. ?Continue fiber supplement daily. ?Continue stool softeners daily.  If she needs additional assistance, could add MiraLAX 1 capful  daily. ?Reinforced antireflux measures.  She may not be able to tolerate some of her trigger foods but if she does prefer to try them she should eat small portions and limit how frequently she tries to consume them. ?If she has addition

## 2022-02-01 NOTE — Patient Instructions (Addendum)
Continue omeprazole '20mg'$  daily before breakfast. You can continue second dose as needed prior to evening meal.  ?If you need to take something for more quick relief of breakthrough reflux, you can use over the counter TUMS or Pepcid Complete.  ?If your constipation worsens after your upcoming hip surgery, you can also take miralax one capful daily to keep stools moving. ?Monitor your weight once per week, take weight is same type clothing and first thing in the morning to get most accurate weights. Call if you have further weight loss, more than 5 pounds.  ?Call if you have any questions or concerns.  ?Return office visit with Dr. Abbey Chatters in six months for follow up.  ? ?Food Choices for Gastroesophageal Reflux Disease, Adult ?When you have gastroesophageal reflux disease (GERD), the foods you eat and your eating habits are very important. Choosing the right foods can help ease the discomfort of GERD. Consider working with a dietitian to help you make healthy food choices. ?What are tips for following this plan? ?Reading food labels ?Look for foods that are low in saturated fat. Foods that have less than 5% of daily value (DV) of fat and 0 g of trans fats may help with your symptoms. ?Cooking ?Cook foods using methods other than frying. This may include baking, steaming, grilling, or broiling. These are all methods that do not need a lot of fat for cooking. ?To add flavor, try to use herbs that are low in spice and acidity. ?Meal planning ? ?Choose healthy foods that are low in fat, such as fruits, vegetables, whole grains, low-fat dairy products, lean meats, fish, and poultry. ?Eat frequent, small meals instead of three large meals each day. Eat your meals slowly, in a relaxed setting. Avoid bending over or lying down until 2-3 hours after eating. ?Limit high-fat foods such as fatty meats or fried foods. ?Limit your intake of fatty foods, such as oils, butter, and shortening. ?Avoid the following as told by your  health care provider: ?Foods that cause symptoms. These may be different for different people. Keep a food diary to keep track of foods that cause symptoms. ?Alcohol. ?Drinking large amounts of liquid with meals. ?Eating meals during the 2-3 hours before bed. ?Lifestyle ?Maintain a healthy weight. Ask your health care provider what weight is healthy for you. If you need to lose weight, work with your health care provider to do so safely. ?Exercise for at least 30 minutes on 5 or more days each week, or as told by your health care provider. ?Avoid wearing clothes that fit tightly around your waist and chest. ?Do not use any products that contain nicotine or tobacco. These products include cigarettes, chewing tobacco, and vaping devices, such as e-cigarettes. If you need help quitting, ask your health care provider. ?Sleep with the head of your bed raised. Use a wedge under the mattress or blocks under the bed frame to raise the head of the bed. ?Chew sugar-free gum after mealtimes. ?What foods should I eat? ? ?Eat a healthy, well-balanced diet of fruits, vegetables, whole grains, low-fat dairy products, lean meats, fish, and poultry. Each person is different. Foods that may trigger symptoms in one person may not trigger any symptoms in another person. Work with your health care provider to identify foods that are safe for you. ?The items listed above may not be a complete list of recommended foods and beverages. Contact a dietitian for more information. ?What foods should I avoid? ?Limiting some of these foods may  help manage the symptoms of GERD. Everyone is different. Consult a dietitian or your health care provider to help you identify the exact foods to avoid, if any. ?Fruits ?Any fruits prepared with added fat. Any fruits that cause symptoms. For some people this may include citrus fruits, such as oranges, grapefruit, pineapple, and lemons. ?Vegetables ?Deep-fried vegetables. [place] fries. Any vegetables  prepared with added fat. Any vegetables that cause symptoms. For some people, this may include tomatoes and tomato products, chili peppers, onions and garlic, and horseradish. ?Grains ?Pastries or quick breads with added fat. ?Meats and other proteins ?High-fat meats, such as fatty beef or pork, hot dogs, ribs, ham, sausage, salami, and bacon. Fried meat or protein, including fried fish and fried chicken. Nuts and nut butters, in large amounts. ?Dairy ?Whole milk and chocolate milk. Sour cream. Cream. Ice cream. Cream cheese. Milkshakes. ?Fats and oils ?Butter. Margarine. Shortening. Ghee. ?Beverages ?Coffee and tea, with or without caffeine. Carbonated beverages. Sodas. Energy drinks. Fruit juice made with acidic fruits, such as orange or grapefruit. Tomato juice. Alcoholic drinks. ?Sweets and desserts ?Chocolate and cocoa. Donuts. ?Seasonings and condiments ?Pepper. Peppermint and spearmint. Added salt. Any condiments, herbs, or seasonings that cause symptoms. For some people, this may include curry, hot sauce, or vinegar-based salad dressings. ?The items listed above may not be a complete list of foods and beverages to avoid. Contact a dietitian for more information. ?Questions to ask your health care provider ?Diet and lifestyle changes are usually the first steps that are taken to manage symptoms of GERD. If diet and lifestyle changes do not improve your symptoms, talk with your health care provider about taking medicines. ?Where to find more information ?International Foundation for Gastrointestinal Disorders: aboutgerd.org ?Summary ?When you have gastroesophageal reflux disease (GERD), food and lifestyle choices may be very helpful in easing the discomfort of GERD. ?Eat frequent, small meals instead of three large meals each day. Eat your meals slowly, in a relaxed setting. Avoid bending over or lying down until 2-3 hours after eating. ?Limit high-fat foods such as fatty meats or fried foods. ?This  information is not intended to replace advice given to you by your health care provider. Make sure you discuss any questions you have with your health care provider. ?Document Revised: 04/07/2020 Document Reviewed: 04/07/2020 ?Elsevier Patient Education ? [employer]. ? ?

## 2022-02-02 NOTE — Patient Instructions (Signed)
DUE TO COVID-19 ONLY TWO VISITORS  (aged 70 and older)  ARE ALLOWED TO COME WITH YOU AND STAY IN THE WAITING ROOM ONLY DURING PRE OP AND PROCEDURE.   ?**NO VISITORS ARE ALLOWED IN THE SHORT STAY AREA OR RECOVERY ROOM!!** ? ?IF YOU WILL BE ADMITTED INTO THE HOSPITAL YOU ARE ALLOWED ONLY FOUR SUPPORT PEOPLE DURING VISITATION HOURS ONLY (7 AM -8PM)   ?The support person(s) must pass our screening, gel in and out, and wear a mask at all times, including in the patient?s room. ?Patients must also wear a mask when staff or their support person are in the room. ?Visitors GUEST BADGE MUST BE WORN VISIBLY  ?One adult visitor may remain with you overnight and MUST be in the room by 8 P.M. ?  ? ? Your procedure is scheduled on: 02/05/22 ? ? Report to [facility] Main Entrance ? ?  Report to admitting at 8:15 AM ? ? Call this number if you have problems the morning of surgery [phone] ? ? Do not eat food :After Midnight. ? ? After Midnight you may have the following liquids until _8:00_____ AM/ DAY OF SURGERY ? ?Water ?Black Coffee (sugar ok, NO MILK/CREAM OR CREAMERS)  ?Tea (sugar ok, NO MILK/CREAM OR CREAMERS) regular and decaf                             ?Plain Jell-O (NO RED)                                           ?Fruit ices (not with fruit pulp, NO RED)                                     ?Popsicles (NO RED)                                                                  ?Juice: apple, WHITE grape, WHITE cranberry ?Sports drinks like Gatorade (NO RED) ?Clear broth(vegetable,chicken,beef) ? ?             ? ?  ?  ?The day of surgery:  ?Drink ONE (1) Pre-Surgery Clear Ensure at 7:45 AM the morning of surgery. Drink in one sitting. Do not sip.  ?This drink was given to you during your hospital  ?pre-op appointment visit. ?Nothing else to drink after completing the  ?Pre-Surgery Clear Ensure at 8:00 AM ?  ?       If you have questions, please contact your surgeon?s office. ? ? ?  ?Oral Hygiene is also important  to reduce your risk of infection.                                    ?Remember - BRUSH YOUR TEETH THE MORNING OF SURGERY WITH YOUR REGULAR TOOTHPASTE ? ? ? Take these medicines the morning of surgery with A SIP OF WATER: Hydralazine, omeprazole ? ? ?                  ?  You may not have any metal on your body including hair pins, jewelry, and body piercing ? ?           Do not wear make-up, lotions, powders, perfumes/cologne, or deodorant ? ?Do not wear nail polish including gel and S&S, artificial/acrylic nails, or any other type of covering on natural nails including finger and toenails. If you have artificial nails, gel coating, etc. that needs to be removed by a nail salon please have this removed prior to surgery or surgery may need to be canceled/ delayed if the surgeon/ anesthesia feels like they are unable to be safely monitored.  ? ?Do not shave  48 hours prior to surgery.  ? ? ? Do not bring valuables to the hospital. [facility] NOT ?            RESPONSIBLE   FOR VALUABLES. ? ? Contacts, dentures or bridgework may not be worn into surgery. ? ? Bring small overnight bag day of surgery. ? . ? ? Special Instructions: Bring a copy of your healthcare power of attorney and living will documents the day of surgery if you haven't scanned them before. ? ?            Please read over the following fact sheets you were given: IF [place] [phone] ? ?   [facility] - Preparing for Surgery ?Before surgery, you can play an important role.  Because skin is not sterile, your skin needs to be as free of germs as possible.  You can reduce the number of germs on your skin by washing with CHG (chlorahexidine gluconate) soap before surgery.  CHG is an antiseptic cleaner which kills germs and bonds with the skin to continue killing germs even after washing. ?Please DO NOT use if you have an allergy to CHG or antibacterial soaps.  If your skin becomes  reddened/irritated stop using the CHG and inform your nurse when you arrive at Short Stay. ?Do not shave (including legs and underarms) for at least 48 hours prior to the first CHG shower.   ?Please follow these instructions carefully: ? 1.  Shower with CHG Soap the night before surgery and the  morning of Surgery. ? 2.  If you choose to wash your hair, wash your hair first as usual with your  normal  shampoo. ? 3.  After you shampoo, rinse your hair and body thoroughly to remove the  shampoo.                    ?        4.  Use CHG as you would any other liquid soap.  You can apply chg directly  to the skin and wash  ?                     Gently with a scrungie or clean washcloth. ? 5.  Apply the CHG Soap to your body ONLY FROM THE NECK DOWN.   Do not use on face/ open      ?                     Wound or open sores. Avoid contact with eyes, ears mouth and genitals (private parts).  ?                     Production manager,  Genitals (private parts) with your normal soap. ?  6.  Wash thoroughly, paying special attention to the area where your surgery  will be performed. ? 7.  Thoroughly rinse your body with warm water from the neck down. ? 8.  DO NOT shower/wash with your normal soap after using and rinsing off  the CHG Soap. ?               9.  Pat yourself dry with a clean towel. ?           10.  Wear clean pajamas. ?           11.  Place clean sheets on your bed the night of your first shower and do not  sleep with pets. ?Day of Surgery : ?Do not apply any lotions/deodorants the morning of surgery.  Please wear clean clothes to the hospital/surgery center. ? ?FAILURE TO FOLLOW THESE INSTRUCTIONS MAY RESULT IN THE CANCELLATION OF YOUR SURGERY ?PATIENT SIGNATURE_________________________________ ? ?NURSE SIGNATURE__________________________________ ? ?________________________________________________________________________  ? ?Incentive Spirometer ? ?An incentive spirometer is a tool that can help keep your lungs  clear and active. This tool measures how well you are filling your lungs with each breath. Taking long deep breaths may help reverse or decrease the chance of developing breathing (pulmonary) problems (especially infection) following: ?A long period of time when you are unable to move or be active. ?BEFORE THE PROCEDURE  ?If the spirometer includes an indicator to show your best effort, your nurse or respiratory therapist will set it to a desired goal. ?If possible, sit up straight or lean slightly forward. Try not to slouch. ?Hold the incentive spirometer in an upright position. ?INSTRUCTIONS FOR USE  ?Sit on the edge of your bed if possible, or sit up as far as you can in bed or on a chair. ?Hold the incentive spirometer in an upright position. ?Breathe out normally. ?Place the mouthpiece in your mouth and seal your lips tightly around it. ?Breathe in slowly and as deeply as possible, raising the piston or the ball toward the top of the column. ?Hold your breath for 3-5 seconds or for as long as possible. Allow the piston or ball to fall to the bottom of the column. ?Remove the mouthpiece from your mouth and breathe out normally. ?Rest for a few seconds and repeat Steps 1 through 7 at least 10 times every 1-2 hours when you are awake. Take your time and take a few normal breaths between deep breaths. ?The spirometer may include an indicator to show your best effort. Use the indicator as a goal to work toward during each repetition. ?After each set of 10 deep breaths, practice coughing to be sure your lungs are clear. If you have an incision (the cut made at the time of surgery), support your incision when coughing by placing a pillow or rolled up towels firmly against it. ?Once you are able to get out of bed, walk around indoors and cough well. You may stop using the incentive spirometer when instructed by your caregiver.  ?RISKS AND COMPLICATIONS ?Take your time so you do not get dizzy or light-headed. ?If you  are in pain, you may need to take or ask for pain medication before doing incentive spirometry. It is harder to take a deep breath if you are having pain. ?AFTER USE ?Rest and breathe slowly and easily. ?It can

## 2022-02-03 ENCOUNTER — Encounter (HOSPITAL_COMMUNITY): Payer: Self-pay

## 2022-02-03 ENCOUNTER — Other Ambulatory Visit: Payer: Self-pay

## 2022-02-03 ENCOUNTER — Encounter (HOSPITAL_COMMUNITY)
Admission: RE | Admit: 2022-02-03 | Discharge: 2022-02-03 | Disposition: A | Discharge status: Home / Self Care | Payer: PPO | Source: Ambulatory Visit | Attending: Orthopaedic Surgery | Admitting: Orthopaedic Surgery

## 2022-02-03 VITALS — BP 155/85 | HR 67 | Temp 97.7°F | Resp 18 | Ht 71.0 in | Wt 205.0 lb

## 2022-02-03 DIAGNOSIS — I10 Essential (primary) hypertension: Secondary | ICD-10-CM | POA: Insufficient documentation

## 2022-02-03 DIAGNOSIS — D649 Anemia, unspecified: Secondary | ICD-10-CM | POA: Diagnosis not present

## 2022-02-03 DIAGNOSIS — I447 Left bundle-branch block, unspecified: Secondary | ICD-10-CM | POA: Diagnosis not present

## 2022-02-03 DIAGNOSIS — M1612 Unilateral primary osteoarthritis, left hip: Secondary | ICD-10-CM | POA: Diagnosis not present

## 2022-02-03 DIAGNOSIS — Z01818 Encounter for other preprocedural examination: Secondary | ICD-10-CM | POA: Diagnosis not present

## 2022-02-03 LAB — CBC
HCT: 32.7 % — ABNORMAL LOW (ref 36.0–46.0)
Hemoglobin: 11.5 g/dL — ABNORMAL LOW (ref 12.0–15.0)
MCH: 34.8 pg — ABNORMAL HIGH (ref 26.0–34.0)
MCHC: 35.2 g/dL (ref 30.0–36.0)
MCV: 99.1 fL (ref 80.0–100.0)
Platelets: 242 10*3/uL (ref 150–400)
RBC: 3.3 MIL/uL — ABNORMAL LOW (ref 3.87–5.11)
RDW: 13.2 % (ref 11.5–15.5)
WBC: 5.3 10*3/uL (ref 4.0–10.5)
nRBC: 0 % (ref 0.0–0.2)

## 2022-02-03 LAB — BASIC METABOLIC PANEL
Anion gap: 7 (ref 5–15)
BUN: 14 mg/dL (ref 8–23)
CO2: 26 mmol/L (ref 22–32)
Calcium: 9.5 mg/dL (ref 8.9–10.3)
Chloride: 108 mmol/L (ref 98–111)
Creatinine, Ser: 0.92 mg/dL (ref 0.44–1.00)
GFR, Estimated: >60 mL/min (ref >60)
Glucose, Bld: 93 mg/dL (ref 70–99)
Potassium: 3.7 mmol/L (ref 3.5–5.1)
Sodium: 141 mmol/L (ref 135–145)

## 2022-02-03 LAB — SURGICAL PCR SCREEN
MRSA, PCR: NEGATIVE
Staphylococcus aureus: NEGATIVE

## 2022-02-03 NOTE — Progress Notes (Signed)
Anesthesia note: ? ?Bowel prep reminder:NAQ ? ?PCP - Dr. Truett Mainland ?Cardiologist -none ?Other-  ? ?Chest x-ray - no ?EKG - 02/03/22-chart ?Stress Test - no ?ECHO - no ?Cardiac Cath - no ? ?Pacemaker/ICD device last checked:NA ? ?Sleep Study - no ?CPAP -  ? ?Pt is pre diabetic-NA ?Fasting Blood Sugar -  ?Checks Blood Sugar _____ ? ?Blood Thinner:ASA 81/ Dr. Legrand Rams ?Blood Thinner Instructions: ?Aspirin Instructions:none ?Last Dose: ? ?Anesthesia review: no ? ?Patient denies shortness of breath, fever, cough and chest pain at PAT appointment ?Pt reports no SOB with any activities. ? ?Patient verbalized understanding of instructions that were given to them at the PAT appointment. Patient was also instructed that they will need to review over the PAT instructions again at home before surgery. yes ?

## 2022-02-04 ENCOUNTER — Telehealth: Payer: Self-pay | Admitting: *Deleted

## 2022-02-04 ENCOUNTER — Encounter (HOSPITAL_COMMUNITY): Payer: Self-pay | Admitting: Physician Assistant

## 2022-02-04 NOTE — Progress Notes (Signed)
Anesthesia Chart Review ? ? Case: [id number] Date/Time: 02/05/22 1045  ? Procedure: LEFT TOTAL HIP ARTHROPLASTY ANTERIOR APPROACH (Left: Hip)  ? Anesthesia type: Spinal  ? Pre-op diagnosis: osteoarthritis left hip  ? Location: [facility]OM 10 / [facility]  ? Surgeons: Mcarthur Rossetti, MD  ? ?  ? ? ?DISCUSSION:71 y.o. never smoker with h/o PONV, HTN, left hip OA scheduled for above procedure 02/05/2022 with Dr. Jean Rosenthal.  ? ?New LBBB on EKG.  Pt will need cardiology evaluation before proceeding with hip replacement.  Discussed with Dr. Trevor Mace office.  ?VS: BP (!) 155/85   Pulse 67   Temp 36.5 ?C (Oral)   Resp 18   Ht '5\' 11"'$  (1.803 m)   Wt 93 kg   SpO2 97%   BMI 28.59 kg/m?  ? ?PROVIDERS: ?[name], Normajean Baxter, MD is PCP  ? ? ?LABS: Labs reviewed: Acceptable for surgery. ?(all labs ordered are listed, but only abnormal results are displayed) ? ?Labs Reviewed  ?CBC - Abnormal; Notable for the following components:  ?    Result Value  ? RBC 3.30 (*)   ? Hemoglobin 11.5 (*)   ? HCT 32.7 (*)   ? MCH 34.8 (*)   ? All other components within normal limits  ?SURGICAL PCR SCREEN  ?BASIC METABOLIC PANEL  ?TYPE AND SCREEN  ? ? ? ?IMAGES: ? ? ?EKG: ?02/03/2022 ?Rate 61 bpm  ?Sinus rhythm with 1st degree A-V block ?Left axis deviation ?Left bundle branch block ?Abnormal ECG ?When compared with ECG of 19-Nov-2012 12:57, ?PREVIOUS ECG IS PRESENT ?Left bundle branch block New since previous tracing ? ?CV: ? ?Past Medical History:  ?Diagnosis Date  ? Acid reflux   ? Anemia   ? Arthritis   ? High cholesterol   ? Hypertension   ? PONV (postoperative nausea and vomiting)   ? Shingles   ? ? ?Past Surgical History:  ?Procedure Laterality Date  ? ABDOMINAL HYSTERECTOMY  1995  ? BACK SURGERY  2014  ? L4-L5  ? BALLOON DILATION N/A 09/11/2021  ? Procedure: BALLOON DILATION;  Surgeon: Eloise Harman, DO;  [facility];  Service: Endoscopy;  Laterality: N/A;  ? BIOPSY  09/11/2021  ? Procedure: BIOPSY;   Surgeon: Eloise Harman, DO;  [facility];  Service: Endoscopy;;  ? CATARACT EXTRACTION W/PHACO Right 05/18/2019  ? Procedure: CATARACT EXTRACTION PHACO AND INTRAOCULAR LENS PLACEMENT (IOC);  Surgeon: [name], MD;  Location[facility];  Service: Ophthalmology;  Laterality: Right;  CDE: 6.76  ? CATARACT EXTRACTION W/PHACO Left 06/01/2019  ? Procedure: CATARACT EXTRACTION PHACO AND INTRAOCULAR LENS PLACEMENT (IOC);  Surgeon: [name], MD;  Location: [facility];  Service: Ophthalmology;  Laterality: Left;  left, CDE: 8.69  ? CHOLECYSTECTOMY  2000  ? COLONOSCOPY  05/08/2009  ? SLF:A few scattered diverticula throughout the entire colon.  Small  internal hemorrhoids.  Otherwise, no polyps, masses, inflammatory changes, or AVMs seen.  Normal retroflexed view of the rectum  ? COLONOSCOPY N/A 06/03/2014  ? SLF:  1. six colon polyps removed. 2. Moderate sized internal hemorrhoids. 3. The left colon is redundant 4.  Moderate diverticulosis throughout the entire examed colon. next TCS in10 years with overtube.  ? COLONOSCOPY WITH PROPOFOL N/A 09/11/2021  ? Procedure: COLONOSCOPY WITH PROPOFOL;  Surgeon: Eloise Harman, DO;  [facility];  Service: Endoscopy;  Laterality: N/A;  10:45am  ? ESOPHAGOGASTRODUODENOSCOPY N/A 06/03/2014  ? SLF:  1. small hiatal hernia  ?  ESOPHAGOGASTRODUODENOSCOPY N/A 02/16/2016  ? slf: gastritis, schazki ring  ? ESOPHAGOGASTRODUODENOSCOPY (EGD) WITH PROPOFOL N/A 09/11/2021  ? Procedure: ESOPHAGOGASTRODUODENOSCOPY (EGD) WITH PROPOFOL;  Surgeon: Eloise Harman, DO;  [facility];  Service: Endoscopy;  Laterality: N/A;  ? GIVENS CAPSULE STUDY N/A 03/19/2016  ? Procedure: GIVENS CAPSULE STUDY;  Surgeon: Danie Binder, MD;  [facility];  Service: Endoscopy;  Laterality: N/A;  0700  ? VASCULAR SURGERY  1992  ? varicose veins  ? ? ?MEDICATIONS: ? acetaminophen (TYLENOL) 650 MG CR tablet  ? aspirin EC 81 MG tablet  ? Calcium Citrate-Vitamin D  (CALCIUM + D PO)  ? cholecalciferol (VITAMIN D3) 25 MCG (1000 UT) tablet  ? docusate sodium (COLACE) 100 MG capsule  ? escitalopram (LEXAPRO) 10 MG tablet  ? fluticasone (FLONASE) 50 MCG/ACT nasal spray  ? hydrALAZINE (APRESOLINE) 50 MG tablet  ? hydrochlorothiazide (HYDRODIURIL) 25 MG tablet  ? losartan (COZAAR) 100 MG tablet  ? lovastatin (MEVACOR) 20 MG tablet  ? montelukast (SINGULAIR) 10 MG tablet  ? Multiple Vitamin (MULTIVITAMIN WITH MINERALS) TABS tablet  ? omeprazole (PRILOSEC) 20 MG capsule  ? Polyethyl Glycol-Propyl Glycol (SYSTANE OP)  ? Potassium 99 MG TABS  ? simethicone (MYLICON) 80 MG chewable tablet  ? traZODone (DESYREL) 50 MG tablet  ? verapamil (CALAN-SR) 240 MG CR tablet  ? ?No current facility-administered medications for this encounter.  ? ? ? ?Konrad Felix Ward, PA-C ?[facility] ?[phone] ? ? ? ? ? ?

## 2022-02-04 NOTE — Telephone Encounter (Signed)
Attempted Ortho bundle pre-op call; no answer and left VM requesting call back. 

## 2022-02-04 NOTE — Anesthesia Preprocedure Evaluation (Deleted)
Anesthesia Evaluation  ? ? ?Airway ? ? ? ? ? ? ? Dental ?  ?Pulmonary ? ?  ? ? ? ? ? ? ? Cardiovascular ?hypertension,  ? ? ?  ?Neuro/Psych ?  ? GI/Hepatic ?  ?Endo/Other  ? ? Renal/GU ?  ? ?  ?Musculoskeletal ? ? Abdominal ?  ?Peds ? Hematology ?  ?Anesthesia Other Findings ? ? Reproductive/Obstetrics ? ?  ? ? ? ? ? ? ? ? ? ? ? ? ? ?  ?  ? ? ? ? ? ? ? ? ?Anesthesia Physical ?Anesthesia Plan ? ?ASA:  ? ?Anesthesia Plan:   ? ?Post-op Pain Management:   ? ?Induction:  ? ?PONV Risk Score and Plan:  ? ?Airway Management Planned:  ? ?Additional Equipment:  ? ?Intra-op Plan:  ? ?Post-operative Plan:  ? ?Informed Consent:  ? ?Plan Discussed with:  ? ?Anesthesia Plan Comments: (See PAT note 02/03/2022)  ? ? ? ? ? ? ?Anesthesia Quick Evaluation ? ?

## 2022-02-05 ENCOUNTER — Ambulatory Visit (HOSPITAL_COMMUNITY): Admission: RE | Admit: 2022-02-05 | Payer: PPO | Source: Home / Self Care | Admitting: Orthopaedic Surgery

## 2022-02-05 ENCOUNTER — Encounter (HOSPITAL_COMMUNITY): Admission: RE | Payer: Self-pay | Source: Home / Self Care

## 2022-02-05 DIAGNOSIS — D649 Anemia, unspecified: Secondary | ICD-10-CM

## 2022-02-05 DIAGNOSIS — Z01818 Encounter for other preprocedural examination: Secondary | ICD-10-CM

## 2022-02-05 LAB — TYPE AND SCREEN
ABO/RH(D): B POS
Antibody Screen: NEGATIVE

## 2022-02-05 SURGERY — ARTHROPLASTY, HIP, TOTAL, ANTERIOR APPROACH
Anesthesia: Spinal | Site: Hip | Laterality: Left

## 2022-02-07 DIAGNOSIS — K219 Gastro-esophageal reflux disease without esophagitis: Secondary | ICD-10-CM | POA: Diagnosis not present

## 2022-02-07 DIAGNOSIS — I10 Essential (primary) hypertension: Secondary | ICD-10-CM | POA: Diagnosis not present

## 2022-02-09 NOTE — Progress Notes (Signed)
?Cardiology Office Note:   ? ?Date:  02/10/2022  ? ?ID:  Jamie Hunt, DOB [birth year], MRN [id number] ? ?PCP:  Jamie Meiers, MD  ? ?[facility] Providers ?Cardiologist:  Jamie Sciara, MD ?Referring MD: Jamie Hunt*  ? ?Chief Complaint/Reason for Referral: New left bundle branch block; preoperative cardiovascular evaluation ? ?ASSESSMENT:   ? ?1. Preoperative cardiovascular examination   ?2. LBBB (left bundle branch block)   ? ? ?PLAN:   ? ?In order of problems listed above: ?1.  Preoperative cardiovascular exam: Given the patient's limited mobility, we will obtain echocardiogram and Lexiscan stress test to evaluate further.  Only if there are high risk findings with any further evaluation be required.  Follow-up in 1 year or earlier if needed. ?2.  Left bundle branch block: In conjunction with a first-degree AV block this is likely due to senile degeneration of conduction system and less likely obstructive coronary artery disease as this would suggest she has developed extensive myocardial necrosis. ? ? ?Shared Decision Making/Informed Consent ?The risks [chest pain, shortness of breath, cardiac arrhythmias, dizziness, blood pressure fluctuations, myocardial infarction, stroke/transient ischemic attack, nausea, vomiting, allergic reaction, radiation exposure, metallic taste sensation and life-threatening complications (estimated to be 1 in 10,000)], benefits (risk stratification, diagnosing coronary artery disease, treatment guidance) and alternatives of a nuclear stress test were discussed in detail with Jamie Hunt and she agrees to proceed.  ? ?Dispo:  Return in about 1 year (around 02/11/2023).  ? ?  ? ?Medication Adjustments/Labs and Tests Ordered: ?Current medicines are reviewed at length with the patient today.  Concerns regarding medicines are outlined above. ? ?The following changes have been made:  no change  ? ?Labs/tests ordered: ?No orders of the defined types were placed in this  encounter. ? ? ?Medication Changes: ?No orders of the defined types were placed in this encounter. ? ? ? ?Current medicines are reviewed at length with the patient today.  The patient does not have concerns regarding medicines. ? ? ?History of Present Illness:   ? ?FOCUSED PROBLEM LIST:   ?1.  Severe osteoarthritis left hip ?2.  Bifascicular block with first-degree and left bundle branch block on EKG April 2023 ?3.  Hypertension ?4.  Hyperlipidemia ? ?The patient is a 71 y.o. female with the indicated medical history here for recommendations regarding new left bundle branch block seen preoperative EKG.  The patient was seen recently in preparation for orthopedic surgery for her left hip.  An EKG demonstrated a new left bundle branch block compared to a previous EKG done in 2014. ? ?The patient is able to do some of her activities of daily living but she uses a cane and she is very slow with this.  She denies any exertional angina or shortness of breath.  She occasionally gets chest pain at rest sometimes after eating.  She is required no emergency room visits or hospitalizations recently.  She is compliant with her medications.  She is very motivated to get her hip repaired so she can be more mobile.  She is otherwise well without complaints. ? ?   ?  ? ? ? ?Current Medications: ?Current Meds  ?Medication Sig  ? acetaminophen (TYLENOL) 650 MG CR tablet Take 1,300 mg by mouth every 8 (eight) hours as needed for pain.  ? aspirin EC 81 MG tablet Take 81 mg by mouth in the morning.  ? Calcium Citrate-Vitamin D (CALCIUM + D PO) Take 1 tablet by mouth daily.  ? cholecalciferol (VITAMIN  D3) 25 MCG (1000 UT) tablet Take 1,000 Units by mouth in the morning.  ? docusate sodium (COLACE) 100 MG capsule Take 100 mg by mouth 2 (two) times daily.  ? escitalopram (LEXAPRO) 10 MG tablet Take 10 mg by mouth at bedtime.  ? fluticasone (FLONASE) 50 MCG/ACT nasal spray Place 1 spray into both nostrils daily for 10 days. (Patient taking  differently: Place 1 spray into both nostrils daily as needed for allergies.)  ? hydrALAZINE (APRESOLINE) 50 MG tablet Take 50 mg by mouth 2 (two) times a day.   ? hydrochlorothiazide (HYDRODIURIL) 25 MG tablet Take 25 mg by mouth in the morning.  ? losartan (COZAAR) 100 MG tablet Take 100 mg by mouth in the morning.  ? lovastatin (MEVACOR) 20 MG tablet Take 20 mg by mouth at bedtime.  ? montelukast (SINGULAIR) 10 MG tablet Take 10 mg by mouth at bedtime.  ? Multiple Vitamin (MULTIVITAMIN WITH MINERALS) TABS tablet Take 1 tablet by mouth in the morning.  ? omeprazole (PRILOSEC) 20 MG capsule Take one capsule 30 minutes before breakfast daily. You may take a second dose before supper if needed.  ? Polyethyl Glycol-Propyl Glycol (SYSTANE OP) Place 1 drop into both eyes 3 (three) times daily.  ? Potassium 99 MG TABS Take 99 mg by mouth in the morning.  ? simethicone (MYLICON) 80 MG chewable tablet Chew 80-160 mg by mouth every 6 (six) hours as needed for flatulence.  ? traZODone (DESYREL) 50 MG tablet Take 50 mg by mouth at bedtime.  ? verapamil (CALAN-SR) 240 MG CR tablet Take 240 mg by mouth at bedtime.  ?  ? ?Allergies:    ?Codeine and Penicillins  ? ?Social History:   ?Social History  ? ?Tobacco Use  ? Smoking status: Never  ? Smokeless tobacco: Never  ? Tobacco comments:  ?  Never smoked  ?Vaping Use  ? Vaping Use: Never used  ?Substance Use Topics  ? Alcohol use: No  ?  Alcohol/week: 0.0 standard drinks  ? Drug use: No  ?  ? ?Family Hx: ?Family History  ?Problem Relation Age of Onset  ? Colon cancer Neg Hx   ? Liver disease Neg Hx   ?  ? ?Review of Systems:   ?Please see the history of present illness.    ?All other systems reviewed and are negative. ?  ? ? ?EKGs/Labs/Other Test Reviewed:   ? ?EKG:  EKG performed today that I personally reviewed demonstrates sinus rhythm with first-degree AV block and left bundle branch block ? ?Prior CV studies: ?None relevant ? ?Other studies Reviewed: ?Review of the additional  studies/records demonstrates: CT abdomen pelvis 2023 demonstrates aortic atherosclerosis without aneurysm ? ?Recent Labs: ?12/01/2021: ALT 20 ?02/03/2022: BUN 14; Creatinine, Ser 0.92; Hemoglobin 11.5; Platelets 242; Potassium 3.7; Sodium 141  ? ?Recent Lipid Panel ?No results found for: CHOL, TRIG, HDL, LDLCALC, LDLDIRECT ? ?Risk Assessment/Calculations:   ? ?  ?    ? ?Physical Exam:   ? ?VS:  BP 128/80   Pulse 78   Ht '5\' 11"'$  (1.803 m)   Wt 210 lb (95.3 kg)   SpO2 95%   BMI 29.29 kg/m?    ?Wt Readings from Last 3 Encounters:  ?02/10/22 210 lb (95.3 kg)  ?02/03/22 205 lb (93 kg)  ?02/01/22 207 lb (93.9 kg)  ?  ?GENERAL:  No apparent distress, AOx3 ?HEENT:  No carotid bruits, +2 carotid impulses, no scleral icterus ?CAR: RRR no murmurs, gallops, rubs, or thrills ?RES:  Clear to auscultation bilaterally ?ABD:  Soft, nontender, nondistended, positive bowel sounds x 4 ?VASC:  +2 radial pulses, +2 carotid pulses, palpable pedal pulses ?NEURO:  CN 2-12 grossly intact; motor and sensory grossly intact ?PSYCH:  No active depression or anxiety ?EXT:  No edema, ecchymosis, or cyanosis ? ?Signed, ?Early Osmond, MD  ?02/10/2022 4:24 PM    ?[place] ?[address], [place], [place]  [address] ?Phone: [phone]; Fax: [phone]  ? ?Note:  This document was prepared using Dragon voice recognition software and may include unintentional dictation errors. ?

## 2022-02-10 ENCOUNTER — Encounter: Payer: Self-pay | Admitting: *Deleted

## 2022-02-10 ENCOUNTER — Ambulatory Visit: Payer: PPO | Admitting: Internal Medicine

## 2022-02-10 VITALS — BP 128/80 | HR 78 | Ht 71.0 in | Wt 210.0 lb

## 2022-02-10 DIAGNOSIS — Z01810 Encounter for preprocedural cardiovascular examination: Secondary | ICD-10-CM

## 2022-02-10 DIAGNOSIS — I447 Left bundle-branch block, unspecified: Secondary | ICD-10-CM

## 2022-02-10 NOTE — Patient Instructions (Signed)
Medication Instructions:  ?No changes today ? ?*If you need a refill on your cardiac medications before your next appointment, please call your pharmacy* ? ? ?Lab Work: ?none ? ? ?Testing/Procedures: ?Your physician has requested that you have an echocardiogram. Echocardiography is a painless test that uses sound waves to create images of your heart. It provides your doctor with information about the size and shape of your heart and how well your heart?s chambers and valves are working. This procedure takes approximately one hour. There are no restrictions for this procedure. ? ?Your physician has requested that you have a lexiscan myoview. For further information please visit [email or web address]. Please follow instruction sheet, as given. ? ? ?Follow-Up: ?At [facility], you and your health needs are our priority.  As part of our continuing mission to provide you with exceptional heart care, we have created designated Provider Care Teams.  These Care Teams include your primary Cardiologist (physician) and Advanced Practice Providers (APPs -  Physician Assistants and Nurse Practitioners) who all work together to provide you with the care you need, when you need it. ? ? ?Your next appointment:   ?12 month(s) ? ?The format for your next appointment:   ?In Person ? ?Provider:   ?Early Osmond, MD   ? ? ?Important Information About Sugar ? ? ? ? ?  ?

## 2022-02-18 ENCOUNTER — Encounter: Payer: PPO | Admitting: Orthopaedic Surgery

## 2022-02-23 ENCOUNTER — Telehealth (HOSPITAL_COMMUNITY): Payer: Self-pay | Admitting: *Deleted

## 2022-02-23 ENCOUNTER — Encounter (HOSPITAL_COMMUNITY): Payer: Self-pay | Admitting: *Deleted

## 2022-02-23 NOTE — Telephone Encounter (Signed)
Left message on voicemail in reference to upcoming appointment scheduled for 03/01/22 Phone number given for a call back so details instructions can be given.  Letter with instructions sent via my chart.  Kirstie Peri ? ? ?

## 2022-03-01 ENCOUNTER — Ambulatory Visit (HOSPITAL_BASED_OUTPATIENT_CLINIC_OR_DEPARTMENT_OTHER): Payer: PPO

## 2022-03-01 ENCOUNTER — Ambulatory Visit (HOSPITAL_COMMUNITY): Payer: PPO | Attending: Cardiovascular Disease

## 2022-03-01 DIAGNOSIS — I447 Left bundle-branch block, unspecified: Secondary | ICD-10-CM | POA: Diagnosis not present

## 2022-03-01 DIAGNOSIS — Z01810 Encounter for preprocedural cardiovascular examination: Secondary | ICD-10-CM

## 2022-03-01 LAB — MYOCARDIAL PERFUSION IMAGING
LV dias vol: 85 mL (ref 46–106)
LV sys vol: 22 mL
Nuc Stress EF: 74 %
Peak HR: 94 {beats}/min
Rest HR: 63 {beats}/min
Rest Nuclear Isotope Dose: 10.2 mCi
SDS: 3
SRS: 0
SSS: 3
Stress Nuclear Isotope Dose: 30.5 mCi
TID: 0.86

## 2022-03-01 LAB — ECHOCARDIOGRAM COMPLETE
Area-P 1/2: 3.42 cm2
Height: 71 in
S' Lateral: 3.2 cm
Weight: 3360 oz

## 2022-03-01 MED ORDER — REGADENOSON 0.4 MG/5ML IV SOLN
0.4000 mg | Freq: Once | INTRAVENOUS | Status: AC
  Administered 2022-03-01: 0.4 mg via INTRAVENOUS

## 2022-03-01 MED ORDER — TECHNETIUM TC 99M TETROFOSMIN IV KIT
10.2000 | PACK | Freq: Once | INTRAVENOUS | Status: AC | PRN
  Administered 2022-03-01: 10.2 via INTRAVENOUS

## 2022-03-01 MED ORDER — TECHNETIUM TC 99M TETROFOSMIN IV KIT
30.5000 | PACK | Freq: Once | INTRAVENOUS | Status: AC | PRN
  Administered 2022-03-01: 30.5 via INTRAVENOUS

## 2022-03-04 ENCOUNTER — Telehealth: Payer: Self-pay | Admitting: Internal Medicine

## 2022-03-04 NOTE — Telephone Encounter (Signed)
Patient has been informed of results of echo and stress test.  Routed to Dr. Ninfa Linden.

## 2022-03-04 NOTE — Telephone Encounter (Signed)
Left message for patient to call back  

## 2022-03-04 NOTE — Telephone Encounter (Signed)
Patient returning call.

## 2022-03-04 NOTE — Telephone Encounter (Signed)
Patient is calling requesting a callback to discuss her lexiscan and echo results.

## 2022-03-09 DIAGNOSIS — K219 Gastro-esophageal reflux disease without esophagitis: Secondary | ICD-10-CM | POA: Diagnosis not present

## 2022-03-09 DIAGNOSIS — Z0001 Encounter for general adult medical examination with abnormal findings: Secondary | ICD-10-CM | POA: Diagnosis not present

## 2022-03-09 DIAGNOSIS — Z1389 Encounter for screening for other disorder: Secondary | ICD-10-CM | POA: Diagnosis not present

## 2022-03-09 DIAGNOSIS — Z23 Encounter for immunization: Secondary | ICD-10-CM | POA: Diagnosis not present

## 2022-03-09 DIAGNOSIS — E785 Hyperlipidemia, unspecified: Secondary | ICD-10-CM | POA: Diagnosis not present

## 2022-03-09 DIAGNOSIS — Z1331 Encounter for screening for depression: Secondary | ICD-10-CM | POA: Diagnosis not present

## 2022-03-09 DIAGNOSIS — F339 Major depressive disorder, recurrent, unspecified: Secondary | ICD-10-CM | POA: Diagnosis not present

## 2022-03-09 DIAGNOSIS — I10 Essential (primary) hypertension: Secondary | ICD-10-CM | POA: Diagnosis not present

## 2022-03-29 ENCOUNTER — Ambulatory Visit (HOSPITAL_COMMUNITY)
Admission: RE | Admit: 2022-03-29 | Discharge: 2022-03-29 | Disposition: A | Discharge status: Home / Self Care | Payer: PPO | Source: Ambulatory Visit | Attending: Internal Medicine | Admitting: Internal Medicine

## 2022-03-29 DIAGNOSIS — Z1231 Encounter for screening mammogram for malignant neoplasm of breast: Secondary | ICD-10-CM | POA: Insufficient documentation

## 2022-04-09 DIAGNOSIS — E785 Hyperlipidemia, unspecified: Secondary | ICD-10-CM | POA: Diagnosis not present

## 2022-04-09 DIAGNOSIS — I10 Essential (primary) hypertension: Secondary | ICD-10-CM | POA: Diagnosis not present

## 2022-04-20 ENCOUNTER — Other Ambulatory Visit: Payer: Self-pay | Admitting: Physician Assistant

## 2022-04-20 ENCOUNTER — Other Ambulatory Visit: Payer: Self-pay

## 2022-04-20 DIAGNOSIS — M1612 Unilateral primary osteoarthritis, left hip: Secondary | ICD-10-CM

## 2022-04-26 NOTE — Patient Instructions (Signed)
DUE TO COVID-19 ONLY TWO VISITORS  (aged 70 and older)  ARE ALLOWED TO COME WITH YOU AND STAY IN THE WAITING ROOM ONLY DURING PRE OP AND PROCEDURE.   **NO VISITORS ARE ALLOWED IN THE SHORT STAY AREA OR RECOVERY ROOM!!**  IF YOU WILL BE ADMITTED INTO THE HOSPITAL YOU ARE ALLOWED ONLY FOUR SUPPORT PEOPLE DURING VISITATION HOURS ONLY (7 AM -8PM)   The support person(s) must pass our screening, gel in and out, and wear a mask at all times, including in the patient's room. Patients must also wear a mask when staff or their support person are in the room. Visitors GUEST BADGE MUST BE WORN VISIBLY  One adult visitor may remain with you overnight and MUST be in the room by 8 P.M.     Your procedure is scheduled on: 04/30/22   Report to [facility] Main Entrance    Report to admitting at 7:45 AM   Call this number if you have problems the morning of surgery [phone]   Do not eat food :After Midnight.   After Midnight you may have the following liquids until 7:15 AM DAY OF SURGERY  Water Black Coffee (sugar ok, NO MILK/CREAM OR CREAMERS)  Tea (sugar ok, NO MILK/CREAM OR CREAMERS) regular and decaf                             Plain Jell-O (NO RED)                                           Fruit ices (not with fruit pulp, NO RED)                                     Popsicles (NO RED)                                                                  Juice: apple, WHITE grape, WHITE cranberry Sports drinks like Gatorade (NO RED) Clear broth(vegetable,chicken,beef      The day of surgery:  Drink ONE (1) Pre-Surgery Clear Ensure at 7:15 AM the morning of surgery. Drink in one sitting. Do not sip.  This drink was given to you during your hospital  pre-op appointment visit. Nothing else to drink after completing the  Pre-Surgery Clear Ensure.          If you have questions, please contact your surgeon's office.   FOLLOW BOWEL PREP AND ANY ADDITIONAL PRE OP INSTRUCTIONS YOU  RECEIVED FROM YOUR SURGEON'S OFFICE!!!     Oral Hygiene is also important to reduce your risk of infection.                                    Remember - BRUSH YOUR TEETH THE MORNING OF SURGERY WITH YOUR REGULAR TOOTHPASTE   Take these medicines the morning of surgery with A SIP OF WATER: Tylenol, Hydralazine, Omeprazole  You may not have any metal on your body including hair pins, jewelry, and body piercing             Do not wear make-up, lotions, powders, perfumes, or deodorant              Men may shave face and neck.   Do not bring valuables to the hospital. [place].   Bring small overnight bag day of surgery.   DO NOT [place]. PHARMACY WILL DISPENSE MEDICATIONS LISTED ON YOUR MEDICATION LIST TO YOU DURING YOUR ADMISSION [place]!               Please read over the following fact sheets you were given: IF YOU HAVE QUESTIONS ABOUT YOUR PRE-OP INSTRUCTIONS PLEASE CALL [facility] - Preparing for Surgery Before surgery, you can play an important role.  Because skin is not sterile, your skin needs to be as free of germs as possible.  You can reduce the number of germs on your skin by washing with CHG (chlorahexidine gluconate) soap before surgery.  CHG is an antiseptic cleaner which kills germs and bonds with the skin to continue killing germs even after washing. Please DO NOT use if you have an allergy to CHG or antibacterial soaps.  If your skin becomes reddened/irritated stop using the CHG and inform your nurse when you arrive at Short Stay. Do not shave (including legs and underarms) for at least 48 hours prior to the first CHG shower.  You may shave your face/neck.  Please follow these instructions carefully:  1.  Shower with CHG Soap the night before surgery and the  morning of surgery.  2.  If you choose to wash your hair, wash your  hair first as usual with your normal  shampoo.  3.  After you shampoo, rinse your hair and body thoroughly to remove the shampoo.                             4.  Use CHG as you would any other liquid soap.  You can apply chg directly to the skin and wash.  Gently with a scrungie or clean washcloth.  5.  Apply the CHG Soap to your body ONLY FROM THE NECK DOWN.   Do   not use on face/ open                           Wound or open sores. Avoid contact with eyes, ears mouth and   genitals (private parts).                       Wash face,  Genitals (private parts) with your normal soap.             6.  Wash thoroughly, paying special attention to the area where your    surgery  will be performed.  7.  Thoroughly rinse your body with warm water from the neck down.  8.  DO NOT shower/wash with your normal soap after using and rinsing off the CHG Soap.                9.  Pat yourself dry with a clean towel.  10.  Wear clean pajamas.            11.  Place clean sheets on your bed the night of your first shower and do not  sleep with pets. Day of Surgery : Do not apply any lotions/deodorants the morning of surgery.  Please wear clean clothes to the hospital/surgery center.  FAILURE TO FOLLOW THESE INSTRUCTIONS MAY RESULT IN THE CANCELLATION OF YOUR SURGERY  PATIENT SIGNATURE_________________________________  NURSE SIGNATURE__________________________________  ________________________________________________________________________   Adam Phenix  An incentive spirometer is a tool that can help keep your lungs clear and active. This tool measures how well you are filling your lungs with each breath. Taking long deep breaths may help reverse or decrease the chance of developing breathing (pulmonary) problems (especially infection) following: A long period of time when you are unable to move or be active. BEFORE THE PROCEDURE  If the spirometer includes an indicator to show your best  effort, your nurse or respiratory therapist will set it to a desired goal. If possible, sit up straight or lean slightly forward. Try not to slouch. Hold the incentive spirometer in an upright position. INSTRUCTIONS FOR USE  Sit on the edge of your bed if possible, or sit up as far as you can in bed or on a chair. Hold the incentive spirometer in an upright position. Breathe out normally. Place the mouthpiece in your mouth and seal your lips tightly around it. Breathe in slowly and as deeply as possible, raising the piston or the ball toward the top of the column. Hold your breath for 3-5 seconds or for as long as possible. Allow the piston or ball to fall to the bottom of the column. Remove the mouthpiece from your mouth and breathe out normally. Rest for a few seconds and repeat Steps 1 through 7 at least 10 times every 1-2 hours when you are awake. Take your time and take a few normal breaths between deep breaths. The spirometer may include an indicator to show your best effort. Use the indicator as a goal to work toward during each repetition. After each set of 10 deep breaths, practice coughing to be sure your lungs are clear. If you have an incision (the cut made at the time of surgery), support your incision when coughing by placing a pillow or rolled up towels firmly against it. Once you are able to get out of bed, walk around indoors and cough well. You may stop using the incentive spirometer when instructed by your caregiver.  RISKS AND COMPLICATIONS Take your time so you do not get dizzy or light-headed. If you are in pain, you may need to take or ask for pain medication before doing incentive spirometry. It is harder to take a deep breath if you are having pain. AFTER USE Rest and breathe slowly and easily. It can be helpful to keep track of a log of your progress. Your caregiver can provide you with a simple table to help with this. If you are using the spirometer at home, follow  these instructions: [place] IF:  You are having difficultly using the spirometer. You have trouble using the spirometer as often as instructed. Your pain medication is not giving enough relief while using the spirometer. You develop fever of 100.5 F (38.1 C) or higher. SEEK IMMEDIATE MEDICAL CARE IF:  You cough up bloody sputum that had not been present before. You develop fever of 102 F (38.9 C) or greater. You develop worsening pain at  or near the incision site. MAKE SURE YOU:  Understand these instructions. Will watch your condition. Will get help right away if you are not doing well or get worse. Document Released: 02/07/2007 Document Revised: 12/20/2011 Document Reviewed: 04/10/2007 Exit[employer] Patient Information 2014 [employer], [place].   ________________________________________________________________________  WHAT IS A BLOOD TRANSFUSION? Blood Transfusion Information  A transfusion is the replacement of blood or some of its parts. Blood is made up of multiple cells which provide different functions. Red blood cells carry oxygen and are used for blood loss replacement. White blood cells fight against infection. Platelets control bleeding. Plasma helps clot blood. Other blood products are available for specialized needs, such as hemophilia or other clotting disorders. BEFORE THE TRANSFUSION  Who gives blood for transfusions?  Healthy volunteers who are fully evaluated to make sure their blood is safe. This is blood bank blood. Transfusion therapy is the safest it has ever been in the practice of medicine. Before blood is taken from a donor, a complete history is taken to make sure that person has no history of diseases nor engages in risky social behavior (examples are intravenous drug use or sexual activity with multiple partners). The donor's travel history is screened to minimize risk of transmitting infections, such as malaria. The donated blood is tested for signs of  infectious diseases, such as HIV and hepatitis. The blood is then tested to be sure it is compatible with you in order to minimize the chance of a transfusion reaction. If you or a relative donates blood, this is often done in anticipation of surgery and is not appropriate for emergency situations. It takes many days to process the donated blood. RISKS AND COMPLICATIONS Although transfusion therapy is very safe and saves many lives, the main dangers of transfusion include:  Getting an infectious disease. Developing a transfusion reaction. This is an allergic reaction to something in the blood you were given. Every precaution is taken to prevent this. The decision to have a blood transfusion has been considered carefully by your caregiver before blood is given. Blood is not given unless the benefits outweigh the risks. AFTER THE TRANSFUSION Right after receiving a blood transfusion, you will usually feel much better and more energetic. This is especially true if your red blood cells have gotten low (anemic). The transfusion raises the level of the red blood cells which carry oxygen, and this usually causes an energy increase. The nurse administering the transfusion will monitor you carefully for complications. HOME CARE INSTRUCTIONS  No special instructions are needed after a transfusion. You may find your energy is better. Speak with your caregiver about any limitations on activity for underlying diseases you may have. SEEK MEDICAL CARE IF:  Your condition is not improving after your transfusion. You develop redness or irritation at the intravenous (IV) site. SEEK IMMEDIATE MEDICAL CARE IF:  Any of the following symptoms occur over the next 12 hours: Shaking chills. You have a temperature by mouth above 102 F (38.9 C), not controlled by medicine. Chest, back, or muscle pain. People around you feel you are not acting correctly or are confused. Shortness of breath or difficulty  breathing. Dizziness and fainting. You get a rash or develop hives. You have a decrease in urine output. Your urine turns a dark color or changes to pink, red, or brown. Any of the following symptoms occur over the next 10 days: You have a temperature by mouth above 102 F (38.9 C), not controlled by medicine. Shortness of breath. Weakness  after normal activity. The white part of the eye turns yellow (jaundice). You have a decrease in the amount of urine or are urinating less often. Your urine turns a dark color or changes to pink, red, or brown. Document Released: 09/24/2000 Document Revised: 12/20/2011 Document Reviewed: 05/13/2008 [facility] Patient Information 2014 [place], [place].  _______________________________________________________________________

## 2022-04-26 NOTE — Progress Notes (Signed)
COVID Vaccine Completed: yes x2  Date of COVID positive in last 90 days:  PCP - Rosita Fire, MD Cardiologist - Lenna Sciara, DO  Cardiac clearance??  Chest x-ray -  EKG - 02/10/22 Epic Stress Test - 03/01/22 Epic ECHO - 03/01/22 Epic Cardiac Cath -  Pacemaker/ICD device last checked: Spinal Cord Stimulator:  Bowel Prep -   Sleep Study -  CPAP -   Fasting Blood Sugar -  Checks Blood Sugar _____ times a day  Blood Thinner Instructions: Aspirin Instructions: Last Dose:  Activity level:  Can go up a flight of stairs and perform activities of daily living without stopping and without symptoms of chest pain or shortness of breath.  Able to exercise without symptoms  Unable to go up a flight of stairs without symptoms of     Anesthesia review: 1st degree AV blovk, LBBB  Patient denies shortness of breath, fever, cough and chest pain at PAT appointment  Patient verbalized understanding of instructions that were given to them at the PAT appointment. Patient was also instructed that they will need to review over the PAT instructions again at home before surgery.

## 2022-04-27 ENCOUNTER — Encounter (HOSPITAL_COMMUNITY)
Admission: RE | Admit: 2022-04-27 | Discharge: 2022-04-27 | Disposition: A | Discharge status: Home / Self Care | Payer: PPO | Source: Ambulatory Visit | Attending: Orthopaedic Surgery | Admitting: Orthopaedic Surgery

## 2022-04-27 ENCOUNTER — Encounter (HOSPITAL_COMMUNITY): Payer: Self-pay

## 2022-04-27 VITALS — BP 139/87 | HR 69 | Temp 98.5°F | Resp 12 | Ht 71.0 in | Wt 204.0 lb

## 2022-04-27 DIAGNOSIS — Z01818 Encounter for other preprocedural examination: Secondary | ICD-10-CM

## 2022-04-27 DIAGNOSIS — Z01812 Encounter for preprocedural laboratory examination: Secondary | ICD-10-CM | POA: Insufficient documentation

## 2022-04-27 DIAGNOSIS — M1612 Unilateral primary osteoarthritis, left hip: Secondary | ICD-10-CM | POA: Diagnosis not present

## 2022-04-27 DIAGNOSIS — I2510 Atherosclerotic heart disease of native coronary artery without angina pectoris: Secondary | ICD-10-CM | POA: Insufficient documentation

## 2022-04-27 LAB — BASIC METABOLIC PANEL
Anion gap: 7 (ref 5–15)
BUN: 14 mg/dL (ref 8–23)
CO2: 25 mmol/L (ref 22–32)
Calcium: 9.4 mg/dL (ref 8.9–10.3)
Chloride: 108 mmol/L (ref 98–111)
Creatinine, Ser: 0.97 mg/dL (ref 0.44–1.00)
GFR, Estimated: >60 mL/min (ref >60)
Glucose, Bld: 82 mg/dL (ref 70–99)
Potassium: 3.4 mmol/L — ABNORMAL LOW (ref 3.5–5.1)
Sodium: 140 mmol/L (ref 135–145)

## 2022-04-27 LAB — CBC
HCT: 31.7 % — ABNORMAL LOW (ref 36.0–46.0)
Hemoglobin: 11 g/dL — ABNORMAL LOW (ref 12.0–15.0)
MCH: 34.2 pg — ABNORMAL HIGH (ref 26.0–34.0)
MCHC: 34.7 g/dL (ref 30.0–36.0)
MCV: 98.4 fL (ref 80.0–100.0)
Platelets: 230 10*3/uL (ref 150–400)
RBC: 3.22 MIL/uL — ABNORMAL LOW (ref 3.87–5.11)
RDW: 12.8 % (ref 11.5–15.5)
WBC: 6.3 10*3/uL (ref 4.0–10.5)
nRBC: 0 % (ref 0.0–0.2)

## 2022-04-27 LAB — SURGICAL PCR SCREEN
MRSA, PCR: NEGATIVE
Staphylococcus aureus: NEGATIVE

## 2022-04-29 ENCOUNTER — Telehealth: Payer: Self-pay | Admitting: *Deleted

## 2022-04-29 NOTE — H&P (Signed)
TOTAL HIP ADMISSION H&P  Patient is admitted for left total hip arthroplasty.  Subjective:  Chief Complaint: left hip pain  HPI: Jamie Hunt, 71 y.o. female, has a history of pain and functional disability in the left hip(s) due to arthritis and patient has failed non-surgical conservative treatments for greater than 12 weeks to include NSAID's and/or analgesics, flexibility and strengthening excercises, use of assistive devices, and activity modification.  Onset of symptoms was gradual starting 2 years ago with gradually worsening course since that time.The patient noted no past surgery on the left hip(s).  Patient currently rates pain in the left hip at 10 out of 10 with activity. Patient has night pain, worsening of pain with activity and weight bearing, pain that interfers with activities of daily living, and pain with passive range of motion. Patient has evidence of subchondral sclerosis, periarticular osteophytes, and joint space narrowing by imaging studies. This condition presents safety issues increasing the risk of falls.  There is no current active infection.  Patient Active Problem List   Diagnosis Date Noted   Unilateral primary osteoarthritis, left hip 11/23/2021   GERD (gastroesophageal reflux disease) 08/14/2021   H/O adenomatous polyp of colon 08/14/2021   Dysphagia 08/14/2021   Gastritis and gastroduodenitis 05/19/2016   Normocytic anemia 03/03/2016   Rectal bleeding 05/28/2014   Abdominal pain, bilateral upper quadrant 05/28/2014   Past Medical History:  Diagnosis Date   Acid reflux    Anemia    Arthritis    High cholesterol    Hypertension    LBBB (left bundle branch block)    PONV (postoperative nausea and vomiting)    Shingles     Past Surgical History:  Procedure Laterality Date   ABDOMINAL HYSTERECTOMY  1995   BACK SURGERY  2014   L4-L5   BALLOON DILATION N/A 09/11/2021   Procedure: BALLOON DILATION;  Surgeon: Eloise Harman, DO;  Location[facility];  Service: Endoscopy;  Laterality: N/A;   BIOPSY  09/11/2021   Procedure: BIOPSY;  Surgeon: Eloise Harman, DO;  Location: [facility];  Service: Endoscopy;;   CATARACT EXTRACTION W/PHACO Right 05/18/2019   Procedure: CATARACT EXTRACTION PHACO AND INTRAOCULAR LENS PLACEMENT (IOC);  Surgeon: [name], MD;  Location: [facility] OR[facility];  Service: Ophthalmology;  Laterality: Right;  CDE: 6.76   CATARACT EXTRACTION W/PHACO Left 06/01/2019   Procedure: CATARACT EXTRACTION PHACO AND INTRAOCULAR LENS PLACEMENT (IOC);  Surgeon: [name], MD;  Location: [facility];  Service: Ophthalmology;  Laterality: Left;  left, CDE: 8.69   CHOLECYSTECTOMY  2000   COLONOSCOPY  05/08/2009   SLF:A few scattered diverticula throughout the entire colon.  Small  internal hemorrhoids.  Otherwise, no polyps, masses, inflammatory changes, or AVMs seen.  Normal retroflexed view of the rectum   COLONOSCOPY N/A 06/03/2014   SLF:  1. six colon polyps removed. 2. Moderate sized internal hemorrhoids. 3. The left colon is redundant 4.  Moderate diverticulosis throughout the entire examed colon. next TCS in10 years with overtube.   COLONOSCOPY WITH PROPOFOL N/A 09/11/2021   Procedure: COLONOSCOPY WITH PROPOFOL;  Surgeon: Eloise Harman, DO;  [facility];  Service: Endoscopy;  Laterality: N/A;  10:45am   ESOPHAGOGASTRODUODENOSCOPY N/A 06/03/2014   SLF:  1. small hiatal hernia   ESOPHAGOGASTRODUODENOSCOPY N/A 02/16/2016   slf: gastritis, schazki ring   ESOPHAGOGASTRODUODENOSCOPY (EGD) WITH PROPOFOL N/A 09/11/2021   Procedure: ESOPHAGOGASTRODUODENOSCOPY (EGD) WITH PROPOFOL;  Surgeon: Eloise Harman, DO;  [facility];  Service:  Endoscopy;  Laterality: N/A;   GIVENS CAPSULE STUDY N/A 03/19/2016   Procedure: GIVENS CAPSULE STUDY;  Surgeon: Danie Binder, MD;  Location: [facility];  Service: Endoscopy;  Laterality: N/A;  0700   VASCULAR SURGERY  1992   varicose veins    No current  facility-administered medications for this encounter.   Current Outpatient Medications  Medication Sig Dispense Refill Last Dose   acetaminophen (TYLENOL) 650 MG CR tablet Take 1,300 mg by mouth in the morning and at bedtime.      aspirin EC 81 MG tablet Take 81 mg by mouth in the morning.      Calcium Citrate-Vitamin D (CALCIUM + D PO) Take 1 tablet by mouth every morning.      cholecalciferol (VITAMIN D3) 25 MCG (1000 UT) tablet Take 1,000 Units by mouth in the morning.      docusate sodium (COLACE) 100 MG capsule Take 100 mg by mouth 2 (two) times daily.      escitalopram (LEXAPRO) 10 MG tablet Take 10 mg by mouth at bedtime.      famciclovir (FAMVIR) 250 MG tablet Take 250 mg by mouth 2 (two) times daily.      fluticasone (FLONASE) 50 MCG/ACT nasal spray Place 1 spray into both nostrils daily for 10 days. (Patient taking differently: Place 1 spray into both nostrils daily as needed for allergies (bronchitis).) 16 g 0    hydrALAZINE (APRESOLINE) 50 MG tablet Take 50 mg by mouth 2 (two) times a day.   3    hydrochlorothiazide (HYDRODIURIL) 25 MG tablet Take 25 mg by mouth in the morning.      losartan (COZAAR) 100 MG tablet Take 100 mg by mouth in the morning.      lovastatin (MEVACOR) 20 MG tablet Take 20 mg by mouth at bedtime.      Menthol, Topical Analgesic, (BIOFREEZE) 10 % CREA Apply 1 Application topically daily as needed (pain). Sometimes use roll-on      montelukast (SINGULAIR) 10 MG tablet Take 10 mg by mouth at bedtime.      Multiple Vitamin (MULTIVITAMIN WITH MINERALS) TABS tablet Take 1 tablet by mouth in the morning.      omeprazole (PRILOSEC) 20 MG capsule Take one capsule 30 minutes before breakfast daily. You may take a second dose before supper if needed. 180 capsule 3    Polyethyl Glycol-Propyl Glycol (SYSTANE OP) Place 1 drop into both eyes 3 (three) times daily.      Potassium 99 MG TABS Take 99 mg by mouth in the morning.      simethicone (MYLICON) 80 MG chewable tablet  Chew 80-160 mg by mouth every 6 (six) hours as needed for flatulence.      traZODone (DESYREL) 50 MG tablet Take 50 mg by mouth at bedtime.      verapamil (CALAN-SR) 240 MG CR tablet Take 240 mg by mouth at bedtime.      Allergies  Allergen Reactions   Anesthetics, Ester Nausea And Vomiting    Not sure which one   Codeine Nausea And Vomiting   Penicillins     Yeast infection Did it involve swelling of the face/tongue/throat, SOB, or low BP? No Did it involve sudden or severe rash/hives, skin peeling, or any reaction on the inside of your mouth or nose? No Did you need to seek medical attention at a hospital or doctor's office? Yes When did it last happen?      10 + year If all above  answers are "NO", may proceed with cephalosporin use.     Social History   Tobacco Use   Smoking status: Never   Smokeless tobacco: Never   Tobacco comments:    Never smoked  Substance Use Topics   Alcohol use: No    Alcohol/week: 0.0 standard drinks of alcohol    Family History  Problem Relation Age of Onset   Colon cancer Neg Hx    Liver disease Neg Hx      Review of Systems  Musculoskeletal:  Positive for gait problem.  All other systems reviewed and are negative.   Objective:  Physical Exam Vitals reviewed.  Constitutional:      Appearance: Normal appearance.  HENT:     Head: Normocephalic and atraumatic.  Eyes:     Extraocular Movements: Extraocular movements intact.     Pupils: Pupils are equal, round, and reactive to light.  Cardiovascular:     Rate and Rhythm: Normal rate and regular rhythm.  Pulmonary:     Effort: Pulmonary effort is normal.     Breath sounds: Normal breath sounds.  Abdominal:     Palpations: Abdomen is soft.  Musculoskeletal:     Cervical back: Normal range of motion and neck supple.     Left hip: Tenderness and bony tenderness present. Decreased range of motion. Decreased strength.  Neurological:     Mental Status: She is alert and oriented to  person, place, and time.  Psychiatric:        Behavior: Behavior normal.     Vital signs in last 24 hours:    Labs:   Estimated body mass index is 28.45 kg/m as calculated from the following:   Height as of 04/27/22: '5\' 11"'$  (1.803 m).   Weight as of 04/27/22: 92.5 kg.   Imaging Review Plain radiographs demonstrate severe degenerative joint disease of the left hip(s). The bone quality appears to be good for age and reported activity level.      Assessment/Plan:  End stage arthritis, left hip(s)  The patient history, physical examination, clinical judgement of the provider and imaging studies are consistent with end stage degenerative joint disease of the left hip(s) and total hip arthroplasty is deemed medically necessary. The treatment options including medical management, injection therapy, arthroscopy and arthroplasty were discussed at length. The risks and benefits of total hip arthroplasty were presented and reviewed. The risks due to aseptic loosening, infection, stiffness, dislocation/subluxation,  thromboembolic complications and other imponderables were discussed.  The patient acknowledged the explanation, agreed to proceed with the plan and consent was signed. Patient is being admitted for inpatient treatment for surgery, pain control, PT, OT, prophylactic antibiotics, VTE prophylaxis, progressive ambulation and ADL's and discharge planning.The patient is planning to be discharged home with home health services

## 2022-04-29 NOTE — Care Plan (Signed)
[facility] RNCM call to patient to discuss her Left total knee arthroplasty with Dr. Ninfa Linden on 04/30/22. Patient is an Ortho bundle patient through [facility] and is agreeable to case management. She has a daughter that will be helping her after discharge. She will need a RW and 3in1/BSC prior to discharge. Anticipate HHPT will be needed after discharge. Referral made to [facility] after choice provided. Reviewed all post op care instructions. Will continue to follow for needs.

## 2022-04-29 NOTE — Telephone Encounter (Signed)
[facility]tho [facility] Pre-op call completed. 

## 2022-04-30 ENCOUNTER — Encounter (HOSPITAL_COMMUNITY)
Admission: RE | Disposition: A | Discharge status: Home / Self Care | Payer: Self-pay | Source: Ambulatory Visit | Attending: Orthopaedic Surgery

## 2022-04-30 ENCOUNTER — Observation Stay (HOSPITAL_COMMUNITY)
Admission: RE | Admit: 2022-04-30 | Discharge: 2022-05-02 | Disposition: A | Discharge status: Home / Self Care | Payer: PPO | Source: Ambulatory Visit | Attending: Orthopaedic Surgery | Admitting: Orthopaedic Surgery

## 2022-04-30 ENCOUNTER — Other Ambulatory Visit: Payer: Self-pay

## 2022-04-30 ENCOUNTER — Observation Stay (HOSPITAL_COMMUNITY): Payer: PPO

## 2022-04-30 ENCOUNTER — Ambulatory Visit (HOSPITAL_COMMUNITY): Payer: PPO

## 2022-04-30 ENCOUNTER — Ambulatory Visit (HOSPITAL_BASED_OUTPATIENT_CLINIC_OR_DEPARTMENT_OTHER): Payer: PPO | Admitting: Anesthesiology

## 2022-04-30 ENCOUNTER — Ambulatory Visit (HOSPITAL_COMMUNITY): Payer: PPO | Admitting: Anesthesiology

## 2022-04-30 ENCOUNTER — Encounter (HOSPITAL_COMMUNITY): Payer: Self-pay | Admitting: Orthopaedic Surgery

## 2022-04-30 DIAGNOSIS — M1612 Unilateral primary osteoarthritis, left hip: Secondary | ICD-10-CM

## 2022-04-30 DIAGNOSIS — I10 Essential (primary) hypertension: Secondary | ICD-10-CM | POA: Insufficient documentation

## 2022-04-30 DIAGNOSIS — Z79899 Other long term (current) drug therapy: Secondary | ICD-10-CM | POA: Insufficient documentation

## 2022-04-30 DIAGNOSIS — Z96642 Presence of left artificial hip joint: Secondary | ICD-10-CM | POA: Diagnosis not present

## 2022-04-30 DIAGNOSIS — Z7982 Long term (current) use of aspirin: Secondary | ICD-10-CM | POA: Insufficient documentation

## 2022-04-30 DIAGNOSIS — Z471 Aftercare following joint replacement surgery: Secondary | ICD-10-CM | POA: Diagnosis not present

## 2022-04-30 DIAGNOSIS — R531 Weakness: Secondary | ICD-10-CM | POA: Diagnosis not present

## 2022-04-30 HISTORY — PX: TOTAL HIP ARTHROPLASTY: SHX124

## 2022-04-30 LAB — TYPE AND SCREEN
ABO/RH(D): B POS
Antibody Screen: NEGATIVE

## 2022-04-30 SURGERY — ARTHROPLASTY, HIP, TOTAL, ANTERIOR APPROACH
Anesthesia: Spinal | Site: Hip | Laterality: Left

## 2022-04-30 MED ORDER — ASPIRIN 81 MG PO CHEW
81.0000 mg | CHEWABLE_TABLET | Freq: Two times a day (BID) | ORAL | Status: DC
  Administered 2022-04-30 – 2022-05-02 (×4): 81 mg via ORAL
  Filled 2022-04-30 (×4): qty 1

## 2022-04-30 MED ORDER — ONDANSETRON HCL 4 MG/2ML IJ SOLN
INTRAMUSCULAR | Status: AC
  Filled 2022-04-30: qty 2

## 2022-04-30 MED ORDER — 0.9 % SODIUM CHLORIDE (POUR BTL) OPTIME
TOPICAL | Status: DC | PRN
  Administered 2022-04-30: 1000 mL

## 2022-04-30 MED ORDER — CEFAZOLIN SODIUM-DEXTROSE 2-4 GM/100ML-% IV SOLN
2.0000 g | INTRAVENOUS | Status: AC
  Administered 2022-04-30: 2 g via INTRAVENOUS
  Filled 2022-04-30: qty 100

## 2022-04-30 MED ORDER — TRANEXAMIC ACID-NACL 1000-0.7 MG/100ML-% IV SOLN
1000.0000 mg | INTRAVENOUS | Status: AC
  Administered 2022-04-30: 1000 mg via INTRAVENOUS
  Filled 2022-04-30: qty 100

## 2022-04-30 MED ORDER — METHOCARBAMOL 500 MG IVPB - SIMPLE MED
INTRAVENOUS | Status: AC
  Filled 2022-04-30: qty 55

## 2022-04-30 MED ORDER — BUPIVACAINE IN DEXTROSE 0.75-8.25 % IT SOLN
INTRATHECAL | Status: DC | PRN
  Administered 2022-04-30: 1.6 mL via INTRATHECAL

## 2022-04-30 MED ORDER — OXYCODONE HCL 5 MG PO TABS
5.0000 mg | ORAL_TABLET | ORAL | Status: DC | PRN
  Administered 2022-04-30: 5 mg via ORAL
  Administered 2022-04-30 – 2022-05-02 (×6): 10 mg via ORAL
  Filled 2022-04-30 (×3): qty 2
  Filled 2022-04-30: qty 1
  Filled 2022-04-30: qty 2

## 2022-04-30 MED ORDER — ONDANSETRON HCL 4 MG/2ML IJ SOLN
4.0000 mg | Freq: Four times a day (QID) | INTRAMUSCULAR | Status: DC | PRN
  Administered 2022-04-30 – 2022-05-02 (×3): 4 mg via INTRAVENOUS
  Filled 2022-04-30 (×3): qty 2

## 2022-04-30 MED ORDER — DEXAMETHASONE SODIUM PHOSPHATE 10 MG/ML IJ SOLN
INTRAMUSCULAR | Status: AC
  Filled 2022-04-30: qty 1

## 2022-04-30 MED ORDER — PROPOFOL 500 MG/50ML IV EMUL
INTRAVENOUS | Status: DC | PRN
  Administered 2022-04-30: 80 ug/kg/min via INTRAVENOUS

## 2022-04-30 MED ORDER — METOCLOPRAMIDE HCL 5 MG PO TABS
5.0000 mg | ORAL_TABLET | Freq: Three times a day (TID) | ORAL | Status: DC | PRN
  Administered 2022-05-02: 5 mg via ORAL
  Filled 2022-04-30: qty 1

## 2022-04-30 MED ORDER — VERAPAMIL HCL ER 240 MG PO TBCR
240.0000 mg | EXTENDED_RELEASE_TABLET | Freq: Every day | ORAL | Status: DC
  Administered 2022-04-30 – 2022-05-01 (×2): 240 mg via ORAL
  Filled 2022-04-30 (×3): qty 1

## 2022-04-30 MED ORDER — LACTATED RINGERS IV SOLN: INTRAVENOUS | Status: DC

## 2022-04-30 MED ORDER — DIPHENHYDRAMINE HCL 12.5 MG/5ML PO ELIX: 12.5000 mg | ORAL_SOLUTION | ORAL | Status: DC | PRN

## 2022-04-30 MED ORDER — CHLORHEXIDINE GLUCONATE 0.12 % MT SOLN
15.0000 mL | Freq: Once | OROMUCOSAL | Status: AC
  Administered 2022-04-30: 15 mL via OROMUCOSAL

## 2022-04-30 MED ORDER — POTASSIUM CHLORIDE CRYS ER 10 MEQ PO TBCR
10.0000 meq | EXTENDED_RELEASE_TABLET | Freq: Every day | ORAL | Status: DC
  Administered 2022-05-01 – 2022-05-02 (×2): 10 meq via ORAL
  Filled 2022-04-30 (×2): qty 1

## 2022-04-30 MED ORDER — POLYETHYLENE GLYCOL 3350 17 G PO PACK
17.0000 g | PACK | Freq: Every day | ORAL | Status: DC | PRN
  Administered 2022-05-02: 17 g via ORAL
  Filled 2022-04-30: qty 1

## 2022-04-30 MED ORDER — ACETAMINOPHEN 325 MG PO TABS: 325.0000 mg | ORAL_TABLET | Freq: Four times a day (QID) | ORAL | Status: DC | PRN

## 2022-04-30 MED ORDER — CEFAZOLIN SODIUM-DEXTROSE 1-4 GM/50ML-% IV SOLN
1.0000 g | Freq: Four times a day (QID) | INTRAVENOUS | Status: AC
  Administered 2022-04-30 (×2): 1 g via INTRAVENOUS
  Filled 2022-04-30 (×2): qty 50

## 2022-04-30 MED ORDER — DOCUSATE SODIUM 100 MG PO CAPS
100.0000 mg | ORAL_CAPSULE | Freq: Two times a day (BID) | ORAL | Status: DC
  Administered 2022-04-30 – 2022-05-02 (×4): 100 mg via ORAL
  Filled 2022-04-30 (×4): qty 1

## 2022-04-30 MED ORDER — METOCLOPRAMIDE HCL 5 MG/ML IJ SOLN
5.0000 mg | Freq: Three times a day (TID) | INTRAMUSCULAR | Status: DC | PRN
  Administered 2022-04-30: 10 mg via INTRAVENOUS
  Filled 2022-04-30: qty 2

## 2022-04-30 MED ORDER — HYDROCHLOROTHIAZIDE 25 MG PO TABS
25.0000 mg | ORAL_TABLET | Freq: Every day | ORAL | Status: DC
  Administered 2022-05-01 – 2022-05-02 (×2): 25 mg via ORAL
  Filled 2022-04-30 (×2): qty 1

## 2022-04-30 MED ORDER — METHOCARBAMOL 500 MG PO TABS
500.0000 mg | ORAL_TABLET | Freq: Four times a day (QID) | ORAL | Status: DC | PRN
  Administered 2022-04-30 – 2022-05-02 (×3): 500 mg via ORAL
  Filled 2022-04-30 (×3): qty 1

## 2022-04-30 MED ORDER — POVIDONE-IODINE 10 % EX SWAB
2.0000 | Freq: Once | CUTANEOUS | Status: AC
  Administered 2022-04-30: 2 via TOPICAL

## 2022-04-30 MED ORDER — VITAMIN D 25 MCG (1000 UNIT) PO TABS
1000.0000 [IU] | ORAL_TABLET | Freq: Every day | ORAL | Status: DC
  Administered 2022-05-01 – 2022-05-02 (×2): 1000 [IU] via ORAL
  Filled 2022-04-30 (×2): qty 1

## 2022-04-30 MED ORDER — MIDAZOLAM HCL 2 MG/2ML IJ SOLN
INTRAMUSCULAR | Status: DC | PRN
  Administered 2022-04-30: 2 mg via INTRAVENOUS

## 2022-04-30 MED ORDER — MENTHOL 3 MG MT LOZG
1.0000 | LOZENGE | OROMUCOSAL | Status: DC | PRN
Start: 2022-04-30 — End: 2022-05-02

## 2022-04-30 MED ORDER — MONTELUKAST SODIUM 10 MG PO TABS
10.0000 mg | ORAL_TABLET | Freq: Every day | ORAL | Status: DC
Start: 2022-04-30 — End: 2022-05-02
  Administered 2022-04-30 – 2022-05-01 (×2): 10 mg via ORAL
  Filled 2022-04-30 (×2): qty 1

## 2022-04-30 MED ORDER — PHENOL 1.4 % MT LIQD: 1.0000 | OROMUCOSAL | Status: DC | PRN

## 2022-04-30 MED ORDER — FENTANYL CITRATE PF 50 MCG/ML IJ SOSY
25.0000 ug | PREFILLED_SYRINGE | INTRAMUSCULAR | Status: DC | PRN
  Administered 2022-04-30: 50 ug via INTRAVENOUS

## 2022-04-30 MED ORDER — MIDAZOLAM HCL 2 MG/2ML IJ SOLN
INTRAMUSCULAR | Status: AC
  Filled 2022-04-30: qty 2

## 2022-04-30 MED ORDER — FENTANYL CITRATE PF 50 MCG/ML IJ SOSY
PREFILLED_SYRINGE | INTRAMUSCULAR | Status: AC
  Filled 2022-04-30: qty 1

## 2022-04-30 MED ORDER — ONDANSETRON HCL 4 MG PO TABS: 4.0000 mg | ORAL_TABLET | Freq: Four times a day (QID) | ORAL | Status: DC | PRN

## 2022-04-30 MED ORDER — OXYCODONE HCL 5 MG PO TABS
10.0000 mg | ORAL_TABLET | ORAL | Status: DC | PRN
  Administered 2022-05-01 – 2022-05-02 (×3): 10 mg via ORAL
  Filled 2022-04-30 (×4): qty 2
  Filled 2022-04-30: qty 3
  Filled 2022-04-30: qty 2

## 2022-04-30 MED ORDER — ACETAMINOPHEN 500 MG PO TABS: 1000.0000 mg | ORAL_TABLET | Freq: Once | ORAL | Status: DC

## 2022-04-30 MED ORDER — ESCITALOPRAM OXALATE 10 MG PO TABS
10.0000 mg | ORAL_TABLET | Freq: Every day | ORAL | Status: DC
  Administered 2022-04-30 – 2022-05-01 (×2): 10 mg via ORAL
  Filled 2022-04-30 (×2): qty 1

## 2022-04-30 MED ORDER — SODIUM CHLORIDE 0.9 % IR SOLN
Status: DC | PRN
  Administered 2022-04-30: 1000 mL

## 2022-04-30 MED ORDER — HYDROMORPHONE HCL 1 MG/ML IJ SOLN
0.5000 mg | INTRAMUSCULAR | Status: DC | PRN
  Administered 2022-04-30: 0.5 mg via INTRAVENOUS
  Administered 2022-04-30: 1 mg via INTRAVENOUS
  Filled 2022-04-30 (×2): qty 1

## 2022-04-30 MED ORDER — ONDANSETRON HCL 4 MG/2ML IJ SOLN
INTRAMUSCULAR | Status: DC | PRN
  Administered 2022-04-30: 4 mg via INTRAVENOUS

## 2022-04-30 MED ORDER — ALUM & MAG HYDROXIDE-SIMETH 200-200-20 MG/5ML PO SUSP
30.0000 mL | ORAL | Status: DC | PRN
  Administered 2022-04-30: 30 mL via ORAL
  Filled 2022-04-30: qty 30

## 2022-04-30 MED ORDER — LOSARTAN POTASSIUM 50 MG PO TABS
100.0000 mg | ORAL_TABLET | Freq: Every day | ORAL | Status: DC
  Administered 2022-05-01: 100 mg via ORAL
  Filled 2022-04-30 (×2): qty 2

## 2022-04-30 MED ORDER — POTASSIUM 99 MG PO TABS: 99.0000 mg | ORAL_TABLET | Freq: Every morning | ORAL | Status: DC

## 2022-04-30 MED ORDER — PROPOFOL 10 MG/ML IV BOLUS
INTRAVENOUS | Status: AC
Start: 2022-04-30 — End: ?
  Filled 2022-04-30: qty 20

## 2022-04-30 MED ORDER — VALACYCLOVIR HCL 500 MG PO TABS
1000.0000 mg | ORAL_TABLET | Freq: Every day | ORAL | Status: DC
  Administered 2022-05-01 – 2022-05-02 (×2): 1000 mg via ORAL
  Filled 2022-04-30 (×2): qty 2

## 2022-04-30 MED ORDER — METHOCARBAMOL 500 MG IVPB - SIMPLE MED
500.0000 mg | Freq: Four times a day (QID) | INTRAVENOUS | Status: DC | PRN
  Administered 2022-04-30: 500 mg via INTRAVENOUS

## 2022-04-30 MED ORDER — PROPOFOL 1000 MG/100ML IV EMUL
INTRAVENOUS | Status: AC
  Filled 2022-04-30: qty 100

## 2022-04-30 MED ORDER — HYDRALAZINE HCL 50 MG PO TABS
50.0000 mg | ORAL_TABLET | Freq: Two times a day (BID) | ORAL | Status: DC
  Administered 2022-04-30 – 2022-05-01 (×3): 50 mg via ORAL
  Filled 2022-04-30 (×4): qty 1

## 2022-04-30 MED ORDER — DEXAMETHASONE SODIUM PHOSPHATE 10 MG/ML IJ SOLN
INTRAMUSCULAR | Status: DC | PRN
  Administered 2022-04-30: 8 mg via INTRAVENOUS

## 2022-04-30 MED ORDER — PANTOPRAZOLE SODIUM 40 MG PO TBEC
40.0000 mg | DELAYED_RELEASE_TABLET | Freq: Every day | ORAL | Status: DC
Start: 2022-04-30 — End: 2022-05-02
  Administered 2022-04-30 – 2022-05-02 (×3): 40 mg via ORAL
  Filled 2022-04-30 (×3): qty 1

## 2022-04-30 MED ORDER — STERILE WATER FOR IRRIGATION IR SOLN
Status: DC | PRN
  Administered 2022-04-30: 2000 mL

## 2022-04-30 MED ORDER — LOSARTAN POTASSIUM 50 MG PO TABS: 100.0000 mg | ORAL_TABLET | Freq: Every morning | ORAL | Status: DC

## 2022-04-30 MED ORDER — ORAL CARE MOUTH RINSE: 15.0000 mL | Freq: Once | OROMUCOSAL | Status: AC

## 2022-04-30 MED ORDER — SODIUM CHLORIDE 0.9 % IV SOLN: INTRAVENOUS | Status: DC

## 2022-04-30 SURGICAL SUPPLY — 46 items
ACETAB CUP W/GRIPTION 54 (Plate) ×2 IMPLANT
APL SKNCLS STERI-STRIP NONHPOA (GAUZE/BANDAGES/DRESSINGS)
BAG COUNTER SPONGE SURGICOUNT (BAG) ×2 IMPLANT
BAG SPEC THK2 15X12 ZIP CLS (MISCELLANEOUS)
BAG SPNG CNTER NS LX DISP (BAG) ×1
BAG ZIPLOCK 12X15 (MISCELLANEOUS) IMPLANT
BENZOIN TINCTURE PRP APPL 2/3 (GAUZE/BANDAGES/DRESSINGS) IMPLANT
BLADE SAW SGTL 18X1.27X75 (BLADE) ×2 IMPLANT
COVER PERINEAL POST (MISCELLANEOUS) ×2 IMPLANT
COVER SURGICAL LIGHT HANDLE (MISCELLANEOUS) ×2 IMPLANT
CUP ACETAB W/GRIPTION 54 (Plate) IMPLANT
DRAPE FOOT SWITCH (DRAPES) ×2 IMPLANT
DRAPE STERI IOBAN 125X83 (DRAPES) ×2 IMPLANT
DRAPE U-SHAPE 47X51 STRL (DRAPES) ×4 IMPLANT
DRSG AQUACEL AG ADV 3.5X10 (GAUZE/BANDAGES/DRESSINGS) ×2 IMPLANT
DURAPREP 26ML APPLICATOR (WOUND CARE) ×2 IMPLANT
ELECT REM PT RETURN 15FT ADLT (MISCELLANEOUS) ×2 IMPLANT
GAUZE XEROFORM 1X8 LF (GAUZE/BANDAGES/DRESSINGS) ×2 IMPLANT
GLOVE BIO SURGEON STRL SZ7.5 (GLOVE) ×2 IMPLANT
GLOVE BIOGEL PI IND STRL 8 (GLOVE) ×2 IMPLANT
GLOVE BIOGEL PI INDICATOR 8 (GLOVE) ×2
GLOVE ECLIPSE 8.0 STRL XLNG CF (GLOVE) ×2 IMPLANT
GOWN STRL REUS W/ TWL XL LVL3 (GOWN DISPOSABLE) ×2 IMPLANT
GOWN STRL REUS W/TWL XL LVL3 (GOWN DISPOSABLE) ×4
HANDPIECE INTERPULSE COAX TIP (DISPOSABLE) ×2
HEAD M SROM 36MM PLUS 1.5 (Hips) IMPLANT
HOLDER FOLEY CATH W/STRAP (MISCELLANEOUS) ×2 IMPLANT
KIT TURNOVER KIT A (KITS) ×1 IMPLANT
LINER NEUTRAL 54X36MM PLUS 4 (Hips) ×1 IMPLANT
PACK ANTERIOR HIP CUSTOM (KITS) ×2 IMPLANT
SET HNDPC FAN SPRY TIP SCT (DISPOSABLE) ×1 IMPLANT
SROM M HEAD 36MM PLUS 1.5 (Hips) ×2 IMPLANT
STAPLER VISISTAT 35W (STAPLE) IMPLANT
STEM FEMORAL SZ6 HIGH ACTIS (Stem) ×1 IMPLANT
STRIP CLOSURE SKIN 1/2X4 (GAUZE/BANDAGES/DRESSINGS) IMPLANT
SUT ETHIBOND NAB CT1 #1 30IN (SUTURE) ×2 IMPLANT
SUT ETHILON 2 0 PS N (SUTURE) IMPLANT
SUT MNCRL AB 4-0 PS2 18 (SUTURE) IMPLANT
SUT VIC AB 0 CT1 36 (SUTURE) ×2 IMPLANT
SUT VIC AB 1 CT1 36 (SUTURE) ×2 IMPLANT
SUT VIC AB 2-0 CT1 27 (SUTURE) ×4
SUT VIC AB 2-0 CT1 TAPERPNT 27 (SUTURE) ×2 IMPLANT
TOWEL OR 17X26 10 PK STRL BLUE (TOWEL DISPOSABLE) ×1 IMPLANT
TRAY FOLEY MTR SLVR 14FR STAT (SET/KITS/TRAYS/PACK) ×1 IMPLANT
TRAY FOLEY MTR SLVR 16FR STAT (SET/KITS/TRAYS/PACK) IMPLANT
YANKAUER SUCT BULB TIP NO VENT (SUCTIONS) ×2 IMPLANT

## 2022-04-30 NOTE — Anesthesia Preprocedure Evaluation (Addendum)
Anesthesia Evaluation  Patient identified by MRN, date of birth, ID band Patient awake    Reviewed: Allergy & Precautions, H&P , NPO status , Patient's Chart, lab work & pertinent test results  History of Anesthesia Complications (+) PONV and history of anesthetic complications  Airway Mallampati: III  TM Distance: >3 FB Neck ROM: Full    Dental no notable dental hx. (+) Teeth Intact, Dental Advisory Given   Pulmonary neg pulmonary ROS,    Pulmonary exam normal breath sounds clear to auscultation       Cardiovascular hypertension, Pt. on medications + dysrhythmias  Rhythm:Regular Rate:Normal     Neuro/Psych negative neurological ROS  negative psych ROS   GI/Hepatic Neg liver ROS, GERD  Medicated,  Endo/Other  negative endocrine ROS  Renal/GU negative Renal ROS  negative genitourinary   Musculoskeletal  (+) Arthritis , Osteoarthritis,    Abdominal   Peds  Hematology  (+) Blood dyscrasia, anemia ,   Anesthesia Other Findings   Reproductive/Obstetrics negative OB ROS                            Anesthesia Physical Anesthesia Plan  ASA: 2  Anesthesia Plan: Spinal   Post-op Pain Management:    Induction: Intravenous  PONV Risk Score and Plan: 4 or greater and Ondansetron, Dexamethasone and Propofol infusion  Airway Management Planned: Natural Airway and Simple Face Mask  Additional Equipment:   Intra-op Plan:   Post-operative Plan: Extubation in OR  Informed Consent: I have reviewed the patients History and Physical, chart, labs and discussed the procedure including the risks, benefits and alternatives for the proposed anesthesia with the patient or authorized representative who has indicated his/her understanding and acceptance.     Dental advisory given  Plan Discussed with: CRNA  Anesthesia Plan Comments:        Anesthesia Quick Evaluation

## 2022-04-30 NOTE — TOC Transition Note (Signed)
Transition of Care [facility]) - CM/SW Discharge Note   Patient Details  Name: Jamie Hunt MRN: [id number] Date of Birth: Aug 20, 1951  [facility] CM/SW Contact:  Lennart Pall, LCSW Phone Number: 04/30/2022, 1:39 PM   Clinical Narrative:     Met with pt and confirming she does need RW and 3n1 commode - no DME agency pref - referral placed with [place] for delivery to her room.  Pt prearranged with [place] for HHPT services.  No further TOC needs.  Final next level of care: Home/Self Care Barriers to Discharge: No Barriers Identified   Patient Goals and CMS Choice Patient states their goals for this hospitalization and ongoing recovery are:: return home      Discharge Placement                       Discharge Plan and Services                DME Arranged: 3-N-1, Walker rolling DME Agency: [employer] Date DME Agency Contacted: 04/30/22 Time DME Agency Contacted: [id number] Representative spoke with at DME Agency: [place]: PT [place]: [place] Determinants of Health (SDOH) Interventions     Readmission Risk Interventions     No data to display

## 2022-04-30 NOTE — Transfer of Care (Signed)
Immediate Anesthesia Transfer of Care Note  Patient: Jamie Hunt  Procedure(s) Performed: LEFT TOTAL HIP ARTHROPLASTY ANTERIOR APPROACH (Left: Hip)  Patient Location: PACU  Anesthesia Type:Spinal  Level of Consciousness: awake, alert  and oriented  Airway & Oxygen Therapy: Patient Spontanous Breathing and Patient connected to face mask oxygen  Post-op Assessment: Report given to RN and Post -op Vital signs reviewed and stable  Post vital signs: Reviewed and stable  Last Vitals:  Vitals Value Taken Time  BP 141/89   Temp    Pulse 79 04/30/22 1144  Resp 16 04/30/22 1144  SpO2 100 % 04/30/22 1144  Vitals shown include unvalidated device data.  Last Pain:  Vitals:   04/30/22 0825  TempSrc: Oral  PainSc:          Complications: No notable events documented.

## 2022-04-30 NOTE — Anesthesia Procedure Notes (Signed)
Procedure Name: MAC Date/Time: 04/30/2022 10:04 AM  Performed by: Niel Hummer, CRNAPre-anesthesia Checklist: Patient identified, Emergency Drugs available, Suction available and Patient being monitored Oxygen Delivery Method: Simple face mask

## 2022-04-30 NOTE — Discharge Instructions (Signed)

## 2022-04-30 NOTE — Anesthesia Procedure Notes (Signed)
Spinal  Patient location during procedure: OR Start time: 04/30/2022 10:07 AM End time: 04/30/2022 10:10 AM Reason for block: surgical anesthesia Staffing Performed: resident/CRNA  Resident/CRNA: Niel Hummer, CRNA Performed by: Niel Hummer, CRNA Authorized by: Roderic Palau, MD   Preanesthetic Checklist Completed: patient identified, IV checked, risks and benefits discussed, surgical consent, monitors and equipment checked, pre-op evaluation and timeout performed Spinal Block Patient position: sitting Prep: DuraPrep Patient monitoring: heart rate, continuous pulse ox and blood pressure Approach: midline Location: L3-4 Injection technique: single-shot Needle Needle type: Pencan  Needle gauge: 24 G Needle length: 10 cm Additional Notes Expiration date of kit checked. Clear CSF prior to injection. Dr. Ola Spurr present.

## 2022-04-30 NOTE — Interval H&P Note (Signed)
History and Physical Interval Note: The patient understands that she is here today for a left hip replacement to treat her left hip osteoarthritis.  There has been no acute or interval change in her medical status.  See H&P.  The risks and benefits of surgery been explained in detail and informed consent is obtained.  The left operative hip has been marked.  04/30/2022 8:42 AM  Jamie Hunt  has presented today for surgery, with the diagnosis of osteoarthritis left hip.  The various methods of treatment have been discussed with the patient and family. After consideration of risks, benefits and other options for treatment, the patient has consented to  Procedure(s): LEFT TOTAL HIP ARTHROPLASTY ANTERIOR APPROACH (Left) as a surgical intervention.  The patient's history has been reviewed, patient examined, no change in status, stable for surgery.  I have reviewed the patient's chart and labs.  Questions were answered to the patient's satisfaction.     Mcarthur Rossetti

## 2022-04-30 NOTE — Care Plan (Signed)
Ortho Bundle Case Management Note  Patient Details  Name: Jamie Hunt MRN: [id number] Date of Birth: 1951-01-25                  [name] RNCM call to patient to discuss her Left total knee arthroplasty with Dr. Ninfa Linden on 04/30/22. Patient is an Ortho bundle patient through [facility] and is agreeable to case management. She has a daughter that will be helping her after discharge. Her RW and 3in1/BSC have already been ordered by hospital SW. Appreciate assistance with this. Anticipate HHPT will be needed after discharge. Referral made to [facility] after choice provided. Reviewed all post op care instructions. Will continue to follow for needs.   DME Arranged:  3-N-1, Walker rolling DME Agency:  [facility]  HH Arranged:  PT HH Agency:  [place]  Additional Comments: Please contact me with any questions of if this plan should need to change.    04/30/2022, 2:23 PM

## 2022-04-30 NOTE — Anesthesia Postprocedure Evaluation (Signed)
Anesthesia Post Note  Patient: Jamie Hunt  Procedure(s) Performed: LEFT TOTAL HIP ARTHROPLASTY ANTERIOR APPROACH (Left: Hip)     Patient location during evaluation: PACU Anesthesia Type: Spinal Level of consciousness: oriented and awake and alert Pain management: pain level controlled Vital Signs Assessment: post-procedure vital signs reviewed and stable Respiratory status: spontaneous breathing, respiratory function stable and patient connected to nasal cannula oxygen Cardiovascular status: blood pressure returned to baseline and stable Postop Assessment: no headache, no backache, no apparent nausea or vomiting, spinal receding and patient able to bend at knees Anesthetic complications: no   No notable events documented.  Last Vitals:  Vitals:   04/30/22 1215 04/30/22 1230  BP: (!) 158/93 (!) 173/97  Pulse: 65 (!) 58  Resp: 17 14  Temp: 36.4 C   SpO2: 100% 100%    Last Pain:  Vitals:   04/30/22 1230  TempSrc:   PainSc: 5                  Carlitos Bottino,W. EDMOND

## 2022-04-30 NOTE — Op Note (Signed)
Operative Note  Date of operation: 04/30/2022 Preoperative diagnosis: Severe arthritis and degenerative joint disease left hip Postoperative diagnosis: Same  Procedure: Left total hip arthroplasty through direct anterior approach  Implants: DePuy sector GRIPTION acetabular size 54, size 36+4 neutral polythene liner, size 6 Actis femoral component with high offset, 36+1.5 metal hip ball  Surgeon: Lind Guest. Ninfa Linden, MD Assistant: Hulen Shouts, PA-C  Anesthesia: Spinal Blood loss: 761 cc Complications: None  Indications: The patient is a very pleasant and active 71 year old female well-known to me.  She is well-documented militating arthritis involving her left hip and has tried and failed all forms of conservative treatment for well over 12 months now.  Her left hip pain is daily and is detrimentally affecting her mobility, her quality of life, and her actives daily living.  At this point she does wish proceed with a total hip arthroplasty given her daily pain.  We reviewed this as well.  We talked in length in detail about the risk of acute blood loss anemia, neurovascular injury, fracture, infection, DVT, implant failure, leg length differences, dislocation and skin and soft tissue issues.  We talked about her goals being hopefully decrease pain, improve mobility, and improve quality of life.  Procedure description: After informed consent was obtained and appropriate left hip was marked she is brought to the operating room and set up in the stretcher where spinal anesthesia was obtained.  She was laid in a supine position and a Foley catheter was placed.  I then placed traction boots on both her feet.  Next she was placed supine on the Hana fracture table to panel patient placed in both legs were placed in inline skeletal traction devices but no traction applied.  Her left operative hip was then prepped and draped with DuraPrep and sterile drapes.  A timeout was called and she identified  as the correct left hip and patient.  An incision was then made just distal and inferior to the anterior superior iliac spine care to slightly obliquely down the leg.  We dissected down to its fascia a lot of muscle and then the tensor fascia was divided longitudinally to proceed with a direct anterior approach to the hip.  Identified and called our circumflex vessels noted of the hip capsule over the hip capsule type format finding a moderate joint effusion and significant para-articular osteophytes around the lateral femoral head neck.  We then made a femoral neck cut with an oscillating saw just proximal to the lesser trochanter and completed this with an osteotome.  I placed a corkscrew guide in the femoral head and the femoral head was removed.  A wide area of cartilage deficit was seen as well as significant osteophytes.  I then placed a bent Hohmann over the medial acetabular rim and removed particular osteophytes around the acetabulum as well as remnants of the acetabular labrum and other debris.  We then began reaming under regularization of from a size 43 reamer and stop resentments going up to size 53 reamer with all reamers placed under direct visualization in the last ring was placed under direct fluoroscopy psych obtain my depth of reaming, the inclination and anteversion.  I then placed the real DePuy sector GRIPTION acetabular size 54.  We went with a 36+4 polyliner based off medialization and offset.  Attention was then turned to the femur.  With the leg externally rotated to about 120 degrees, extended, and a deducted, we were then able to place a medial retractor medially and a  Hohmann tractor behind the greater trochanter.  A box cutting ostium was used to enter the femoral canal and a rongure was used to lateralized.  I then began broaching using the Actis broaching system from a size 0 going all the way up to a size 7.  We then trialed a standard offset femoral neck and a 36+1.5 trial hip ball.   The leg was brought back overnight with traction and internal rotation reduced in the pelvis.  And then assessed radiographically and clinically.  Based on this assessment we felt like we needed a little more offset.  We then dislocated the hip and remove the trial components.  We placed the real high offset Actis femoral component size 7 and what was the real 36+1.5 head ball.  Once again reduce this in the acetabulum and we are pleased with range of motion, offset, leg length and stability assessed both mechanically and radiographically.  The soft tissue was then irrigated with normal saline solution.  The joint capsule was closed with interrupted #1 Ethibond suture followed by #1 Vicryl to close the tensor fascia, 0 Vicryl was used to close the deep tissue, and 2-0 Vicryl was used to close the subcutaneous tissue.  The skin was closed with staples.  An Aquacel dressing is applied.  The patient was then taken off of the Hana table and taken to recovery room in stable addition with all final counts being correct and no complications noted.  Benita Stabile, PA-C assisted in the entire case from beginning and and his assistance was crucial and medically necessary for soft tissue management and retraction, helping guide implant placement and a layered closure of the wound.

## 2022-04-30 NOTE — Evaluation (Signed)
Physical Therapy Evaluation Patient Details Name: Jamie Hunt MRN: [id number] DOB: [birth year] Today's Date: 04/30/2022  History of Present Illness  Pt s/p L THR and with hx of back surg and LBBB  Clinical Impression  Pt s/p L THR and presents with decreased L LE strength/ROM and post op pain limiting functional mobility.  Pt should progress to dc home with family assist.     Recommendations for follow up therapy are one component of a multi-disciplinary discharge planning process, led by the attending physician.  Recommendations may be updated based on patient status, additional functional criteria and insurance authorization.  Follow Up Recommendations Follow physician's recommendations for discharge plan and follow up therapies      Assistance Recommended at Discharge Frequent or constant Supervision/Assistance  Patient can return home with the following  A lot of help with walking and/or transfers;A little help with bathing/dressing/bathroom;Assistance with cooking/housework;Assist for transportation;Help with stairs or ramp for entrance    Equipment Recommendations Rolling walker (2 wheels);BSC/3in1  Recommendations for Other Services       Functional Status Assessment Patient has had a recent decline in their functional status and demonstrates the ability to make significant improvements in function in a reasonable and predictable amount of time.     Precautions / Restrictions Precautions Precautions: Fall Restrictions Weight Bearing Restrictions: No Other Position/Activity Restrictions: WBAT      Mobility  Bed Mobility Overal bed mobility: Needs Assistance Bed Mobility: Supine to Sit, Sit to Supine     Supine to sit: Min assist, Mod assist, +2 for physical assistance, +2 for safety/equipment Sit to supine: Min assist, Mod assist, +2 for physical assistance, +2 for safety/equipment   General bed mobility comments: Increased time with cues for sequence and use of  R LE to self assist    Transfers Overall transfer level: Needs assistance Equipment used: Rolling walker (2 wheels) Transfers: Sit to/from Stand Sit to Stand: Min assist, +2 physical assistance, +2 safety/equipment, From elevated surface           General transfer comment: cues for LE management and use of UEs to self assist    Ambulation/Gait Ambulation/Gait assistance: Min assist, +2 physical assistance, +2 safety/equipment Gait Distance (Feet): 3 Feet Assistive device: Rolling walker (2 wheels) Gait Pattern/deviations: Step-to pattern, Decreased step length - right, Decreased step length - left, Shuffle, Trunk flexed Gait velocity: decr     General Gait Details: Pt side stepped up side of bed only - limited by nausea  Stairs            Wheelchair Mobility    Modified Rankin (Stroke Patients Only)       Balance Overall balance assessment: Needs assistance Sitting-balance support: No upper extremity supported, Feet supported Sitting balance-Leahy Scale: Fair     Standing balance support: Bilateral upper extremity supported Standing balance-Leahy Scale: Poor                               Pertinent Vitals/Pain Pain Assessment Pain Assessment: 0-10 Pain Score: 8  Pain Location: L hip Pain Descriptors / Indicators: Aching, Sore Pain Intervention(s): Limited activity within patient's tolerance, Monitored during session, Premedicated before session, Patient requesting pain meds-RN notified, RN gave pain meds during session, Ice applied    Home Living Family/patient expects to be discharged to:: Private residence Living Arrangements: Alone Available Help at Discharge: Family;Available 24 hours/day (dtr will stay with) Type of Home: House Home Access: Stairs to  enter   Entrance Stairs-Number of Steps: 1   Home Layout: One level Home Equipment: Cane - single point      Prior Function Prior Level of Function : Independent/Modified  Independent                     Hand Dominance        Extremity/Trunk Assessment   Upper Extremity Assessment Upper Extremity Assessment: Overall WFL for tasks assessed    Lower Extremity Assessment Lower Extremity Assessment: LLE deficits/detail    Cervical / Trunk Assessment Cervical / Trunk Assessment: Normal  Communication   Communication: No difficulties  Cognition Arousal/Alertness: Awake/alert Behavior During Therapy: WFL for tasks assessed/performed Overall Cognitive Status: Within Functional Limits for tasks assessed                                          General Comments      Exercises Total Joint Exercises Ankle Circles/Pumps: AROM, Both, 15 reps, Supine   Assessment/Plan    PT Assessment Patient needs continued PT services  PT Problem List Decreased strength;Decreased range of motion;Decreased activity tolerance;Decreased balance;Decreased mobility;Decreased knowledge of use of DME;Pain       PT Treatment Interventions DME instruction;Gait training;Stair training;Functional mobility training;Therapeutic activities;Therapeutic exercise;Patient/family education    PT Goals (Current goals can be found in the Care Plan section)  Acute Rehab PT Goals Patient Stated Goal: Regain IND PT Goal Formulation: With patient Time For Goal Achievement: 05/07/22 Potential to Achieve Goals: Good    Frequency 7X/week     Co-evaluation               AM-PAC PT "6 Clicks" Mobility  Outcome Measure Help needed turning from your back to your side while in a flat bed without using bedrails?: A Lot Help needed moving from lying on your back to sitting on the side of a flat bed without using bedrails?: A Lot Help needed moving to and from a bed to a chair (including a wheelchair)?: A Lot Help needed standing up from a chair using your arms (e.g., wheelchair or bedside chair)?: A Lot Help needed to walk in hospital room?: Total Help  needed climbing 3-5 steps with a railing? : Total 6 Click Score: 10    End of Session Equipment Utilized During Treatment: Gait belt Activity Tolerance: Patient tolerated treatment well;Patient limited by fatigue Patient left: in bed;with call bell/phone within reach;with nursing/sitter in room Nurse Communication: Mobility status PT Visit Diagnosis: Difficulty in walking, not elsewhere classified (R26.2)    Time: 3976-7341 PT Time Calculation (min) (ACUTE ONLY): 27 min   Charges:   PT Evaluation $PT Eval Low Complexity: 1 Low          [place] Pager [phone] Office [phone]   Markhi Kleckner 04/30/2022, 4:16 PM

## 2022-05-01 DIAGNOSIS — M1612 Unilateral primary osteoarthritis, left hip: Secondary | ICD-10-CM | POA: Diagnosis not present

## 2022-05-01 LAB — CBC
HCT: 28 % — ABNORMAL LOW (ref 36.0–46.0)
Hemoglobin: 9.7 g/dL — ABNORMAL LOW (ref 12.0–15.0)
MCH: 34.5 pg — ABNORMAL HIGH (ref 26.0–34.0)
MCHC: 34.6 g/dL (ref 30.0–36.0)
MCV: 99.6 fL (ref 80.0–100.0)
Platelets: 216 10*3/uL (ref 150–400)
RBC: 2.81 MIL/uL — ABNORMAL LOW (ref 3.87–5.11)
RDW: 12.6 % (ref 11.5–15.5)
WBC: 9.6 10*3/uL (ref 4.0–10.5)
nRBC: 0 % (ref 0.0–0.2)

## 2022-05-01 LAB — BASIC METABOLIC PANEL
Anion gap: 8 (ref 5–15)
BUN: 16 mg/dL (ref 8–23)
CO2: 25 mmol/L (ref 22–32)
Calcium: 8.8 mg/dL — ABNORMAL LOW (ref 8.9–10.3)
Chloride: 109 mmol/L (ref 98–111)
Creatinine, Ser: 1.01 mg/dL — ABNORMAL HIGH (ref 0.44–1.00)
GFR, Estimated: 60 mL/min — ABNORMAL LOW (ref >60)
Glucose, Bld: 140 mg/dL — ABNORMAL HIGH (ref 70–99)
Potassium: 3.5 mmol/L (ref 3.5–5.1)
Sodium: 142 mmol/L (ref 135–145)

## 2022-05-01 MED ORDER — METHOCARBAMOL 500 MG PO TABS: 500.0000 mg | ORAL_TABLET | Freq: Four times a day (QID) | ORAL | 1 refills | Status: DC | PRN

## 2022-05-01 MED ORDER — OXYCODONE HCL 5 MG PO TABS
5.0000 mg | ORAL_TABLET | Freq: Four times a day (QID) | ORAL | 0 refills | Status: DC | PRN
Start: 2022-05-01 — End: 2022-05-13

## 2022-05-01 MED ORDER — ASPIRIN 81 MG PO CHEW: 81.0000 mg | CHEWABLE_TABLET | Freq: Two times a day (BID) | ORAL | 0 refills | Status: DC

## 2022-05-01 NOTE — Plan of Care (Signed)
  Problem: Education: Goal: Knowledge of General Education information will improve Description Including pain rating scale, medication(s)/side effects and non-pharmacologic comfort measures Outcome: Progressing   Problem: Health Behavior/Discharge Planning: Goal: Ability to manage health-related needs will improve Outcome: Progressing   

## 2022-05-01 NOTE — Progress Notes (Signed)
Subjective: 1 Day Post-Op Procedure(s) (LRB): LEFT TOTAL HIP ARTHROPLASTY ANTERIOR APPROACH (Left) Patient reports pain as moderate.  Acute blood loss anemia from surgery, but minimal drop from her chronic anemia. Some nausea.  Vitals stable.  Objective: Vital signs in last 24 hours: Temp:  [97.6 F (36.4 C)-98.9 F (37.2 C)] 98 F (36.7 C) (07/22 0956) Pulse Rate:  [51-92] 73 (07/22 0956) Resp:  [13-20] 17 (07/22 0956) BP: (119-173)/(61-97) 119/61 (07/22 0956) SpO2:  [94 %-100 %] 94 % (07/22 0956)  Intake/Output from previous day: 07/21 0701 - 07/22 0700 In: 2510.2 [P.O.:120; I.V.:2085.2; IV Piggyback:305] Out: 1850 [Urine:1600; Blood:250] Intake/Output this shift: Total I/O In: 175 [P.O.:175] Out: -   Recent Labs    05/01/22 0335  HGB 9.7*   Recent Labs    05/01/22 0335  WBC 9.6  RBC 2.81*  HCT 28.0*  PLT 216   Recent Labs    05/01/22 0335  NA 142  K 3.5  CL 109  CO2 25  BUN 16  CREATININE 1.01*  GLUCOSE 140*  CALCIUM 8.8*   No results for input(s): "LABPT", "INR" in the last 72 hours.  Sensation intact distally Intact pulses distally Dorsiflexion/Plantar flexion intact Incision: dressing C/D/I   Assessment/Plan: 1 Day Post-Op Procedure(s) (LRB): LEFT TOTAL HIP ARTHROPLASTY ANTERIOR APPROACH (Left) Up with therapy Plan for discharge tomorrow Discharge home with home health      Mcarthur Rossetti 05/01/2022, 11:18 AM

## 2022-05-01 NOTE — Progress Notes (Signed)
Physical Therapy Treatment Patient Details Name: Jamie Hunt MRN: [id number] DOB: 13-Jan-1951 Today's Date: 05/01/2022   History of Present Illness Pt s/p L THR and with hx of back surg and LBBB    PT Comments    Pt very cooperative and progressing steadily with mobility but requiring increased time for all tasks and limited by nausea.  Recommendations for follow up therapy are one component of a multi-disciplinary discharge planning process, led by the attending physician.  Recommendations may be updated based on patient status, additional functional criteria and insurance authorization.  Follow Up Recommendations  Follow physician's recommendations for discharge plan and follow up therapies     Assistance Recommended at Discharge Frequent or constant Supervision/Assistance  Patient can return home with the following A lot of help with walking and/or transfers;A little help with bathing/dressing/bathroom;Assistance with cooking/housework;Assist for transportation;Help with stairs or ramp for entrance   Equipment Recommendations  Rolling walker (2 wheels);BSC/3in1    Recommendations for Other Services       Precautions / Restrictions Precautions Precautions: Fall Restrictions Weight Bearing Restrictions: No Other Position/Activity Restrictions: WBAT     Mobility  Bed Mobility Overal bed mobility: Needs Assistance Bed Mobility: Supine to Sit     Supine to sit: Min assist, Mod assist     General bed mobility comments: Increased time with cues for sequence and use of R LE to self assist    Transfers Overall transfer level: Needs assistance Equipment used: Rolling walker (2 wheels) Transfers: Sit to/from Stand Sit to Stand: Min assist, From elevated surface           General transfer comment: cues for LE management and use of UEs to self assist    Ambulation/Gait Ambulation/Gait assistance: Min assist Gait Distance (Feet): 42 Feet Assistive device: Rolling  walker (2 wheels) Gait Pattern/deviations: Step-to pattern, Decreased step length - right, Decreased step length - left, Shuffle, Trunk flexed Gait velocity: decr     General Gait Details: Cues for sequence, posture and position from [employer]             Wheelchair Mobility    Modified Rankin (Stroke Patients Only)       Balance Overall balance assessment: Needs assistance Sitting-balance support: No upper extremity supported, Feet supported Sitting balance-Leahy Scale: Fair     Standing balance support: Bilateral upper extremity supported Standing balance-Leahy Scale: Poor                              Cognition Arousal/Alertness: Awake/alert Behavior During Therapy: WFL for tasks assessed/performed Overall Cognitive Status: Within Functional Limits for tasks assessed                                          Exercises Total Joint Exercises Ankle Circles/Pumps: AROM, Both, 15 reps, Supine Quad Sets: AROM, Left, 10 reps, Supine Heel Slides: AAROM, Left, 15 reps, Supine Hip ABduction/ADduction: AAROM, Left, 15 reps, Supine    General Comments        Pertinent Vitals/Pain Pain Assessment Pain Assessment: 0-10 Pain Score: 7  Pain Location: L hip Pain Descriptors / Indicators: Aching, Sore Pain Intervention(s): Limited activity within patient's tolerance, Monitored during session, Premedicated before session, Ice applied    Home Living  Prior Function            PT Goals (current goals can now be found in the care plan section) Acute Rehab PT Goals Patient Stated Goal: Regain IND PT Goal Formulation: With patient Time For Goal Achievement: 05/07/22 Potential to Achieve Goals: Good Progress towards PT goals: Progressing toward goals    Frequency    7X/week      PT Plan Current plan remains appropriate    Co-evaluation              AM-PAC PT "6 Clicks" Mobility    Outcome Measure  Help needed turning from your back to your side while in a flat bed without using bedrails?: A Lot Help needed moving from lying on your back to sitting on the side of a flat bed without using bedrails?: A Lot Help needed moving to and from a bed to a chair (including a wheelchair)?: A Lot Help needed standing up from a chair using your arms (e.g., wheelchair or bedside chair)?: A Lot Help needed to walk in hospital room?: A Little Help needed climbing 3-5 steps with a railing? : A Lot 6 Click Score: 13    End of Session Equipment Utilized During Treatment: Gait belt Activity Tolerance: Patient tolerated treatment well;Patient limited by fatigue Patient left: in chair;with call bell/phone within reach;with family/visitor present Nurse Communication: Mobility status PT Visit Diagnosis: Difficulty in walking, not elsewhere classified (R26.2)     Time: 0822-0905 PT Time Calculation (min) (ACUTE ONLY): 43 min  Charges:  $Gait Training: 8-22 mins $Therapeutic Exercise: 8-22 mins $Therapeutic Activity: 8-22 mins                     Jamie Hunt PT Acute Rehabilitation Services Pager [phone] Office [phone]    Jamie Hunt 05/01/2022, 12:52 PM

## 2022-05-01 NOTE — Progress Notes (Signed)
Physical Therapy Treatment Patient Details Name: Jamie Hunt MRN: [id number] DOB: 1951/08/21 Today's Date: 05/01/2022   History of Present Illness Pt s/p L THR and with hx of back surg and LBBB    PT Comments    Pt continues very cooperative and progressing steadily with mobility but requires increased time for all tasks.  Pt performed therex program and assist from bed to ambulate increased distance in hall before returning to bathroom - CNA monitoring.     Recommendations for follow up therapy are one component of a multi-disciplinary discharge planning process, led by the attending physician.  Recommendations may be updated based on patient status, additional functional criteria and insurance authorization.  Follow Up Recommendations  Follow physician's recommendations for discharge plan and follow up therapies     Assistance Recommended at Discharge Frequent or constant Supervision/Assistance  Patient can return home with the following A lot of help with walking and/or transfers;A little help with bathing/dressing/bathroom;Assistance with cooking/housework;Assist for transportation;Help with stairs or ramp for entrance   Equipment Recommendations  Rolling walker (2 wheels);BSC/3in1    Recommendations for Other Services       Precautions / Restrictions Precautions Precautions: Fall Restrictions Weight Bearing Restrictions: No Other Position/Activity Restrictions: WBAT     Mobility  Bed Mobility Overal bed mobility: Needs Assistance Bed Mobility: Supine to Sit     Supine to sit: Min assist, Mod assist     General bed mobility comments: Increased time with cues for sequence and use of R LE to self assist    Transfers Overall transfer level: Needs assistance Equipment used: Rolling walker (2 wheels) Transfers: Sit to/from Stand Sit to Stand: Min assist, From elevated surface           General transfer comment: cues for LE management and use of UEs to self  assist    Ambulation/Gait Ambulation/Gait assistance: Min assist, Min guard Gait Distance (Feet): 74 Feet Assistive device: Rolling walker (2 wheels) Gait Pattern/deviations: Step-to pattern, Decreased step length - right, Decreased step length - left, Shuffle, Trunk flexed Gait velocity: decr     General Gait Details: INcreased time with cues for sequence, posture and position from [employer]             Wheelchair Mobility    Modified Rankin (Stroke Patients Only)       Balance Overall balance assessment: Needs assistance Sitting-balance support: No upper extremity supported, Feet supported Sitting balance-Leahy Scale: Good     Standing balance support: Bilateral upper extremity supported Standing balance-Leahy Scale: Poor                              Cognition Arousal/Alertness: Awake/alert Behavior During Therapy: WFL for tasks assessed/performed Overall Cognitive Status: Within Functional Limits for tasks assessed                                          Exercises Total Joint Exercises Ankle Circles/Pumps: AROM, Both, 15 reps, Supine Quad Sets: AROM, Left, 10 reps, Supine Heel Slides: AAROM, Left, 15 reps, Supine Hip ABduction/ADduction: AAROM, Left, 15 reps, Supine    General Comments        Pertinent Vitals/Pain Pain Assessment Pain Assessment: 0-10 Pain Score: 6  Pain Location: L hip Pain Descriptors / Indicators: Aching, Sore Pain Intervention(s): Limited activity within patient's tolerance, Monitored  during session, Premedicated before session    Home Living                          Prior Function            PT Goals (current goals can now be found in the care plan section) Acute Rehab PT Goals Patient Stated Goal: Regain IND PT Goal Formulation: With patient Time For Goal Achievement: 05/07/22 Potential to Achieve Goals: Good Progress towards PT goals: Progressing toward goals     Frequency    7X/week      PT Plan Current plan remains appropriate    Co-evaluation              AM-PAC PT "6 Clicks" Mobility   Outcome Measure  Help needed turning from your back to your side while in a flat bed without using bedrails?: A Lot Help needed moving from lying on your back to sitting on the side of a flat bed without using bedrails?: A Lot Help needed moving to and from a bed to a chair (including a wheelchair)?: A Lot Help needed standing up from a chair using your arms (e.g., wheelchair or bedside chair)?: A Little Help needed to walk in hospital room?: A Little Help needed climbing 3-5 steps with a railing? : A Lot 6 Click Score: 14    End of Session Equipment Utilized During Treatment: Gait belt Activity Tolerance: Patient tolerated treatment well;Patient limited by fatigue Patient left: Other (comment) (up in bathroom with CNA aware) Nurse Communication: Mobility status PT Visit Diagnosis: Difficulty in walking, not elsewhere classified (R26.2)     Time: 1400-1440 PT Time Calculation (min) (ACUTE ONLY): 40 min  Charges:  $Gait Training: 23-37 mins $Therapeutic Exercise: 8-22 mins $Therapeutic Activity: 8-22 mins                     [place] Pager [phone] Office [phone]    [name] 05/01/2022, 2:48 PM

## 2022-05-02 DIAGNOSIS — M1612 Unilateral primary osteoarthritis, left hip: Secondary | ICD-10-CM | POA: Diagnosis not present

## 2022-05-02 MED ORDER — ONDANSETRON HCL 4 MG PO TABS: 4.0000 mg | ORAL_TABLET | Freq: Three times a day (TID) | ORAL | 0 refills | Status: DC | PRN

## 2022-05-02 NOTE — Progress Notes (Signed)
Patient doing well.  Vital signs stable.  No complaint of chest pain, shortness of breath, calf pain, abdominal pain, fevers, chills.  She performed well with physical therapy and was able to ambulate well and ascend stairs.  No dizziness or lightheadedness with ambulation.  Her and her daughter feel prepared for discharge home today.  On exam, patient has palpable DP pulse of the operative extremity.  No calf tenderness bilaterally.  Incision looks to be healing well with no evidence of infection or dehiscence.  Dressing intact but there was some area where the dressing is stuck to itself and would allow water to seep into the area of the incision.  Dressing was changed.  Leg lengths equal.  Plan is to discharge home today.  Follow-up with Dr. Ninfa Linden in clinic in about 2 weeks.

## 2022-05-02 NOTE — Progress Notes (Signed)
Physical Therapy Treatment Patient Details Name: Jamie Hunt MRN: [id number] DOB: 11/04/1950 Today's Date: 05/02/2022   History of Present Illness Pt s/p L THR and with hx of back surg and LBBB    PT Comments    Pt with noted improvement in quality of movement with therex and with decreased level of assist so move supine to sit but ambulation deferred with onset of N/V.  Pt assisted to chair and RN notified.   Recommendations for follow up therapy are one component of a multi-disciplinary discharge planning process, led by the attending physician.  Recommendations may be updated based on patient status, additional functional criteria and insurance authorization.  Follow Up Recommendations  Follow physician's recommendations for discharge plan and follow up therapies     Assistance Recommended at Discharge Frequent or constant Supervision/Assistance  Patient can return home with the following A little help with walking and/or transfers;A little help with bathing/dressing/bathroom;Assistance with cooking/housework;Assist for transportation;Help with stairs or ramp for entrance   Equipment Recommendations  Rolling walker (2 wheels);BSC/3in1    Recommendations for Other Services       Precautions / Restrictions Precautions Precautions: Fall Restrictions Weight Bearing Restrictions: No Other Position/Activity Restrictions: WBAT     Mobility  Bed Mobility Overal bed mobility: Needs Assistance Bed Mobility: Supine to Sit     Supine to sit: Min assist     General bed mobility comments: Increased time with cues for sequence and use of R LE to self assist    Transfers Overall transfer level: Needs assistance Equipment used: Rolling walker (2 wheels) Transfers: Sit to/from Stand, Bed to chair/wheelchair/BSC Sit to Stand: Min guard   Step pivot transfers: Min guard       General transfer comment: cues for LE management and use of UEs to self assist; step pvt bed to  recliner    Ambulation/Gait         Gait velocity: decr     General Gait Details: bed to chair only 2* N&V   Stairs             Wheelchair Mobility    Modified Rankin (Stroke Patients Only)       Balance Overall balance assessment: Needs assistance Sitting-balance support: No upper extremity supported, Feet supported Sitting balance-Leahy Scale: Good     Standing balance support: Bilateral upper extremity supported Standing balance-Leahy Scale: Poor                              Cognition Arousal/Alertness: Awake/alert Behavior During Therapy: WFL for tasks assessed/performed Overall Cognitive Status: Within Functional Limits for tasks assessed                                          Exercises Total Joint Exercises Ankle Circles/Pumps: AROM, Both, 15 reps, Supine Quad Sets: AROM, Left, 10 reps, Supine Heel Slides: AAROM, Left, 15 reps, Supine Hip ABduction/ADduction: AAROM, Left, 15 reps, Supine    General Comments        Pertinent Vitals/Pain Pain Assessment Pain Assessment: 0-10 Pain Score: 3  Pain Location: L hip Pain Descriptors / Indicators: Aching, Sore Pain Intervention(s): Limited activity within patient's tolerance, Monitored during session, Premedicated before session, Ice applied    Home Living  Prior Function            PT Goals (current goals can now be found in the care plan section) Acute Rehab PT Goals Patient Stated Goal: Regain IND PT Goal Formulation: With patient Time For Goal Achievement: 05/07/22 Potential to Achieve Goals: Good Progress towards PT goals: Progressing toward goals    Frequency    7X/week      PT Plan Current plan remains appropriate    Co-evaluation              AM-PAC PT "6 Clicks" Mobility   Outcome Measure  Help needed turning from your back to your side while in a flat bed without using bedrails?: A Little Help  needed moving from lying on your back to sitting on the side of a flat bed without using bedrails?: A Little Help needed moving to and from a bed to a chair (including a wheelchair)?: A Little Help needed standing up from a chair using your arms (e.g., wheelchair or bedside chair)?: A Little Help needed to walk in hospital room?: Total Help needed climbing 3-5 steps with a railing? : Total 6 Click Score: 14    End of Session Equipment Utilized During Treatment: Gait belt Activity Tolerance: Other (comment) (N&V) Patient left: in chair;with call bell/phone within reach;with family/visitor present;with nursing/sitter in room Nurse Communication: Mobility status PT Visit Diagnosis: Difficulty in walking, not elsewhere classified (R26.2)     Time: 7001-7494 PT Time Calculation (min) (ACUTE ONLY): 35 min  Charges:  $Therapeutic Exercise: 8-22 mins $Therapeutic Activity: 8-22 mins                     Debe Coder PT Acute Rehabilitation Services Pager [phone] Office [phone]    [facility] 05/02/2022, 12:37 PM

## 2022-05-02 NOTE — Progress Notes (Signed)
The patient is alert and oriented and has been seen by her physician. The orders for discharge were written. IV has been removed. Went over discharge instructions with patient and family. She is being discharged via wheelchair with all of her belongings.  

## 2022-05-02 NOTE — Progress Notes (Signed)
Physical Therapy Treatment Patient Details Name: Jamie Hunt MRN: [id number] DOB: 08-24-1951 Today's Date: 05/02/2022   History of Present Illness Pt s/p L THR and with hx of back surg and LBBB    PT Comments    Pt with no c/o nausea this pm.  Pt up to ambulate in hall, negotiated stair, up to bathroom for toileting and hand hygiene at sink, reviewed bed mobility, and reviewed car transfers.  Pt pleased with progress this session. Pt and dtr state both feel comfortable with dc and eager for return home.  Recommendations for follow up therapy are one component of a multi-disciplinary discharge planning process, led by the attending physician.  Recommendations may be updated based on patient status, additional functional criteria and insurance authorization.  Follow Up Recommendations  Follow physician's recommendations for discharge plan and follow up therapies     Assistance Recommended at Discharge Frequent or constant Supervision/Assistance  Patient can return home with the following A little help with walking and/or transfers;A little help with bathing/dressing/bathroom;Assistance with cooking/housework;Assist for transportation;Help with stairs or ramp for entrance   Equipment Recommendations  Rolling walker (2 wheels);BSC/3in1    Recommendations for Other Services       Precautions / Restrictions Precautions Precautions: Fall Restrictions Weight Bearing Restrictions: No Other Position/Activity Restrictions: WBAT     Mobility  Bed Mobility Overal bed mobility: Needs Assistance Bed Mobility: Sit to Supine     Supine to sit: Min assist Sit to supine: Min assist   General bed mobility comments: Increased time with cues for sequence and use of R LE to self assist. Physical assist to manage L LE only    Transfers Overall transfer level: Needs assistance Equipment used: Rolling walker (2 wheels) Transfers: Sit to/from Stand Sit to Stand: Min guard, Supervision    Step pivot transfers: Min guard       General transfer comment: cues for LE management and use of UEs to self assist; up from recliner, to/from [facility] and to EOB    Ambulation/Gait Ambulation/Gait assistance: Min guard, Supervision Gait Distance (Feet): 80 Feet (and 15' into bathroom) Assistive device: Rolling walker (2 wheels) Gait Pattern/deviations: Step-to pattern, Decreased step length - right, Decreased step length - left, Shuffle, Trunk flexed Gait velocity: decr     General Gait Details: min cues for posture and position from RW   Stairs Stairs: Yes Stairs assistance: Min assist Stair Management: No rails, Step to pattern, Forwards, With walker Number of Stairs: 2 General stair comments: single step twice with RW and cues for sequence.  Dtr present   Wheelchair Mobility    Modified Rankin (Stroke Patients Only)       Balance Overall balance assessment: Needs assistance Sitting-balance support: No upper extremity supported, Feet supported Sitting balance-Leahy Scale: Good     Standing balance support: No upper extremity supported Standing balance-Leahy Scale: Fair Standing balance comment: pt at sink for hand hygiene                            Cognition Arousal/Alertness: Awake/alert Behavior During Therapy: WFL for tasks assessed/performed Overall Cognitive Status: Within Functional Limits for tasks assessed                                          Exercises Total Joint Exercises Ankle Circles/Pumps: AROM, Both, 15 reps, Supine Quad  Sets: AROM, Left, 10 reps, Supine Heel Slides: AAROM, Left, 15 reps, Supine Hip ABduction/ADduction: AAROM, Left, 15 reps, Supine    General Comments        Pertinent Vitals/Pain Pain Assessment Pain Assessment: 0-10 Pain Score: 3  Pain Location: L hip Pain Descriptors / Indicators: Aching, Sore Pain Intervention(s): Limited activity within patient's tolerance, Monitored during session,  Premedicated before session    Home Living                          Prior Function            PT Goals (current goals can now be found in the care plan section) Acute Rehab PT Goals Patient Stated Goal: Regain IND PT Goal Formulation: With patient Time For Goal Achievement: 05/07/22 Potential to Achieve Goals: Good Progress towards PT goals: Progressing toward goals    Frequency    7X/week      PT Plan Current plan remains appropriate    Co-evaluation              AM-PAC PT "6 Clicks" Mobility   Outcome Measure  Help needed turning from your back to your side while in a flat bed without using bedrails?: A Little Help needed moving from lying on your back to sitting on the side of a flat bed without using bedrails?: A Little Help needed moving to and from a bed to a chair (including a wheelchair)?: A Little Help needed standing up from a chair using your arms (e.g., wheelchair or bedside chair)?: A Little Help needed to walk in hospital room?: A Little Help needed climbing 3-5 steps with a railing? : A Little 6 Click Score: 18    End of Session Equipment Utilized During Treatment: Gait belt Activity Tolerance: Patient tolerated treatment well Patient left: in bed;with call bell/phone within reach;with family/visitor present Nurse Communication: Mobility status PT Visit Diagnosis: Difficulty in walking, not elsewhere classified (R26.2)     Time: 0141-0301 PT Time Calculation (min) (ACUTE ONLY): 38 min  Charges:  $Gait Training: 8-22 mins $Therapeutic Exercise: 8-22 mins $Therapeutic Activity: 8-22 mins                     Debe Coder PT Acute Rehabilitation Services Pager [phone] Office [phone]    Jamie Hunt 05/02/2022, 12:43 PM

## 2022-05-02 NOTE — Plan of Care (Signed)
  Problem: Education: Goal: Knowledge of General Education information will improve Description Including pain rating scale, medication(s)/side effects and non-pharmacologic comfort measures Outcome: Progressing   

## 2022-05-03 ENCOUNTER — Telehealth: Payer: Self-pay | Admitting: *Deleted

## 2022-05-03 ENCOUNTER — Encounter (HOSPITAL_COMMUNITY): Payer: Self-pay | Admitting: Orthopaedic Surgery

## 2022-05-03 DIAGNOSIS — Z86010 Personal history of colonic polyps: Secondary | ICD-10-CM | POA: Diagnosis not present

## 2022-05-03 DIAGNOSIS — I10 Essential (primary) hypertension: Secondary | ICD-10-CM | POA: Diagnosis not present

## 2022-05-03 DIAGNOSIS — I447 Left bundle-branch block, unspecified: Secondary | ICD-10-CM | POA: Diagnosis not present

## 2022-05-03 DIAGNOSIS — Z96642 Presence of left artificial hip joint: Secondary | ICD-10-CM | POA: Diagnosis not present

## 2022-05-03 DIAGNOSIS — D649 Anemia, unspecified: Secondary | ICD-10-CM | POA: Diagnosis not present

## 2022-05-03 DIAGNOSIS — Z90710 Acquired absence of both cervix and uterus: Secondary | ICD-10-CM | POA: Diagnosis not present

## 2022-05-03 DIAGNOSIS — Z7982 Long term (current) use of aspirin: Secondary | ICD-10-CM | POA: Diagnosis not present

## 2022-05-03 DIAGNOSIS — E7800 Pure hypercholesterolemia, unspecified: Secondary | ICD-10-CM | POA: Diagnosis not present

## 2022-05-03 DIAGNOSIS — Z9049 Acquired absence of other specified parts of digestive tract: Secondary | ICD-10-CM | POA: Diagnosis not present

## 2022-05-03 DIAGNOSIS — Z471 Aftercare following joint replacement surgery: Secondary | ICD-10-CM | POA: Diagnosis not present

## 2022-05-03 DIAGNOSIS — K219 Gastro-esophageal reflux disease without esophagitis: Secondary | ICD-10-CM | POA: Diagnosis not present

## 2022-05-03 NOTE — Telephone Encounter (Signed)
Ortho bundle D/C call completed. 

## 2022-05-07 ENCOUNTER — Telehealth: Payer: Self-pay | Admitting: *Deleted

## 2022-05-07 NOTE — Telephone Encounter (Signed)
Ortho bundle 7 day call completed. 

## 2022-05-09 DIAGNOSIS — I10 Essential (primary) hypertension: Secondary | ICD-10-CM | POA: Diagnosis not present

## 2022-05-09 DIAGNOSIS — J329 Chronic sinusitis, unspecified: Secondary | ICD-10-CM | POA: Diagnosis not present

## 2022-05-12 ENCOUNTER — Telehealth: Payer: Self-pay

## 2022-05-12 NOTE — Telephone Encounter (Signed)
Can you tell/see if this was ordered?

## 2022-05-12 NOTE — Telephone Encounter (Signed)
Brandon with [facility] called and states that the pt was supposed to get a shower chair and she has not received this yet and was wanting to know the status for this. Asked to call pt to advise. S/p left total hip

## 2022-05-13 ENCOUNTER — Encounter: Payer: Self-pay | Admitting: Orthopaedic Surgery

## 2022-05-13 ENCOUNTER — Telehealth: Payer: Self-pay | Admitting: *Deleted

## 2022-05-13 ENCOUNTER — Ambulatory Visit (INDEPENDENT_AMBULATORY_CARE_PROVIDER_SITE_OTHER): Payer: PPO | Admitting: Orthopaedic Surgery

## 2022-05-13 DIAGNOSIS — Z96642 Presence of left artificial hip joint: Secondary | ICD-10-CM

## 2022-05-13 MED ORDER — OXYCODONE HCL 5 MG PO TABS: 5.0000 mg | ORAL_TABLET | Freq: Four times a day (QID) | ORAL | 0 refills | Status: DC | PRN

## 2022-05-13 NOTE — Discharge Summary (Signed)
Patient ID: Jamie Hunt MRN: [id number] DOB/AGE: 12/12/1950 71 y.o.  Admit date: 04/30/2022 Discharge date: 05/02/22  Admission Diagnoses:  Principal Problem:   Unilateral primary osteoarthritis, left hip Active Problems:   Status post total replacement of left hip   Discharge Diagnoses:  Same  Past Medical History:  Diagnosis Date   Acid reflux    Anemia    Arthritis    High cholesterol    Hypertension    LBBB (left bundle branch block)    PONV (postoperative nausea and vomiting)    Shingles     Surgeries: Procedure(s): LEFT TOTAL HIP ARTHROPLASTY ANTERIOR APPROACH on 04/30/2022   Consultants:   Discharged Condition: Improved  Hospital Course: Jamie Hunt is an 71 y.o. female who was admitted 04/30/2022 for operative treatment ofUnilateral primary osteoarthritis, left hip. Patient has severe unremitting pain that affects sleep, daily activities, and work/hobbies. After pre-op clearance the patient was taken to the operating room on 04/30/2022 and underwent  Procedure(s): LEFT TOTAL HIP ARTHROPLASTY ANTERIOR APPROACH.    Patient was given perioperative antibiotics:  Anti-infectives (From admission, onward)    Start     Dose/Rate Route Frequency Ordered Stop   05/01/22 1000  valACYclovir (VALTREX) tablet 1,000 mg  Status:  Discontinued        1,000 mg Oral Daily 04/30/22 1313 05/02/22 2136   04/30/22 1600  ceFAZolin (ANCEF) IVPB 1 g/50 mL premix        1 g 100 mL/hr over 30 Minutes Intravenous Every 6 hours 04/30/22 1313 04/30/22 2229   04/30/22 0815  ceFAZolin (ANCEF) IVPB 2g/100 mL premix        2 g 200 mL/hr over 30 Minutes Intravenous On call to O.R. 04/30/22 0807 04/30/22 1041        Patient was given sequential compression devices, early ambulation, and chemoprophylaxis to prevent DVT.  Patient benefited maximally from hospital stay and there were no complications.    Recent vital signs: No data found.   Recent laboratory studies: No results for  input(s): "WBC", "HGB", "HCT", "PLT", "NA", "K", "CL", "CO2", "BUN", "CREATININE", "GLUCOSE", "INR", "CALCIUM" in the last 72 hours.  Invalid input(s): "PT", "2"   Discharge Medications:   Allergies as of 05/02/2022       Reactions   Anesthetics, Ester Nausea And Vomiting   Not sure which one   Codeine Nausea And Vomiting   Penicillins    Yeast infection Did it involve swelling of the face/tongue/throat, SOB, or low BP? No Did it involve sudden or severe rash/hives, skin peeling, or any reaction on the inside of your mouth or nose? No Did you need to seek medical attention at a hospital or doctor's office? Yes When did it last happen?      10 + year If all above answers are "NO", may proceed with cephalosporin use.        Medication List     STOP taking these medications    aspirin EC 81 MG tablet Replaced by: aspirin 81 MG chewable tablet       TAKE these medications    acetaminophen 650 MG CR tablet Commonly known as: TYLENOL Take 1,300 mg by mouth in the morning and at bedtime.   aspirin 81 MG chewable tablet Chew 1 tablet (81 mg total) by mouth 2 (two) times daily. Replaces: aspirin EC 81 MG tablet   Biofreeze 10 % Crea Generic drug: Menthol (Topical Analgesic) Apply 1 Application topically daily as needed (pain). Sometimes use roll-on  CALCIUM + D PO Take 1 tablet by mouth every morning.   cholecalciferol 25 MCG (1000 UNIT) tablet Commonly known as: VITAMIN D3 Take 1,000 Units by mouth in the morning.   docusate sodium 100 MG capsule Commonly known as: COLACE Take 100 mg by mouth 2 (two) times daily.   escitalopram 10 MG tablet Commonly known as: LEXAPRO Take 10 mg by mouth at bedtime.   famciclovir 250 MG tablet Commonly known as: FAMVIR Take 250 mg by mouth 2 (two) times daily.   fluticasone 50 MCG/ACT nasal spray Commonly known as: FLONASE Place 1 spray into both nostrils daily for 10 days. What changed:  when to take this reasons to  take this   hydrALAZINE 50 MG tablet Commonly known as: APRESOLINE Take 50 mg by mouth 2 (two) times a day.   hydrochlorothiazide 25 MG tablet Commonly known as: HYDRODIURIL Take 25 mg by mouth in the morning.   losartan 100 MG tablet Commonly known as: COZAAR Take 100 mg by mouth in the morning.   lovastatin 20 MG tablet Commonly known as: MEVACOR Take 20 mg by mouth at bedtime.   methocarbamol 500 MG tablet Commonly known as: ROBAXIN Take 1 tablet (500 mg total) by mouth every 6 (six) hours as needed for muscle spasms.   montelukast 10 MG tablet Commonly known as: SINGULAIR Take 10 mg by mouth at bedtime.   multivitamin with minerals Tabs tablet Take 1 tablet by mouth in the morning.   omeprazole 20 MG capsule Commonly known as: PRILOSEC Take one capsule 30 minutes before breakfast daily. You may take a second dose before supper if needed.   ondansetron 4 MG tablet Commonly known as: Zofran Take 1 tablet (4 mg total) by mouth every 8 (eight) hours as needed for nausea or vomiting.   oxyCODONE 5 MG immediate release tablet Commonly known as: Oxy IR/ROXICODONE Take 1-2 tablets (5-10 mg total) by mouth every 6 (six) hours as needed for moderate pain (pain score 4-6). No more than 6 tablets daily.   Potassium 99 MG Tabs Take 99 mg by mouth in the morning.   simethicone 80 MG chewable tablet Commonly known as: MYLICON Chew 85-277 mg by mouth every 6 (six) hours as needed for flatulence.   SYSTANE OP Place 1 drop into both eyes 3 (three) times daily.   traZODone 50 MG tablet Commonly known as: DESYREL Take 50 mg by mouth at bedtime.   verapamil 240 MG CR tablet Commonly known as: CALAN-SR Take 240 mg by mouth at bedtime.        Diagnostic Studies: DG Pelvis Portable  Result Date: 04/30/2022 CLINICAL DATA:  Status post left hip arthroplasty. EXAM: PORTABLE PELVIS 1-2 VIEWS COMPARISON:  December 29, 2016. FINDINGS: The left acetabular and femoral components are  well situated. Expected postoperative changes are seen in the surrounding soft tissues. IMPRESSION: Status post left total hip arthroplasty. Electronically Signed   By: Marijo Conception M.D.   On: 04/30/2022 12:19   DG HIP UNILAT WITH PELVIS 1V LEFT  Result Date: 04/30/2022 CLINICAL DATA:  Left hip replacement. EXAM: DG HIP (WITH OR WITHOUT PELVIS) 1V*L* COMPARISON:  Left hip x-rays dated October 13, 2021. FLUOROSCOPY TIME:  Radiation Exposure Index (as provided by the fluoroscopic device): 2.2 mGy Kerma C-arm fluoroscopic images were obtained intraoperatively and submitted for post operative interpretation. FINDINGS: Several intraoperative fluoroscopic images demonstrate interval left total hip arthroplasty. Components are well aligned. No acute osseous abnormality. IMPRESSION: 1. Intraoperative fluoroscopic guidance for  left total hip arthroplasty. Electronically Signed   By: Titus Dubin M.D.   On: 04/30/2022 11:33   DG C-Arm 1-60 Min-No Report  Result Date: 04/30/2022 Fluoroscopy was utilized by the requesting physician.  No radiographic interpretation.   DG C-Arm 1-60 Min-No Report  Result Date: 04/30/2022 Fluoroscopy was utilized by the requesting physician.  No radiographic interpretation.    Disposition: Discharge disposition: 01-Home or Self Care       Discharge Instructions     Call MD / Call 911   Complete by: As directed    If you experience chest pain or shortness of breath, CALL 911 and be transported to the hospital emergency room.  If you develope a fever above 101 F, pus (white drainage) or increased drainage or redness at the wound, or calf pain, call your surgeon's office.   Constipation Prevention   Complete by: As directed    Drink plenty of fluids.  Prune juice may be helpful.  You may use a stool softener, such as Colace (over the counter) 100 mg twice a day.  Use MiraLax (over the counter) for constipation as needed.   Diet - low sodium heart healthy   Complete  by: As directed    Increase activity slowly as tolerated   Complete by: As directed    Post-operative opioid taper instructions:   Complete by: As directed    POST-OPERATIVE OPIOID TAPER INSTRUCTIONS: It is important to wean off of your opioid medication as soon as possible. If you do not need pain medication after your surgery it is ok to stop day one. Opioids include: Codeine, Hydrocodone(Norco, Vicodin), Oxycodone(Percocet, oxycontin) and hydromorphone amongst others.  Long term and even short term use of opiods can cause: Increased pain response Dependence Constipation Depression Respiratory depression And more.  Withdrawal symptoms can include Flu like symptoms Nausea, vomiting And more Techniques to manage these symptoms Hydrate well Eat regular healthy meals Stay active Use relaxation techniques(deep breathing, meditating, yoga) Do Not substitute Alcohol to help with tapering If you have been on opioids for less than two weeks and do not have pain than it is ok to stop all together.  Plan to wean off of opioids This plan should start within one week post op of your joint replacement. Maintain the same interval or time between taking each dose and first decrease the dose.  Cut the total daily intake of opioids by one tablet each day Next start to increase the time between doses. The last dose that should be eliminated is the evening dose.           Follow-up Information     [facility] [place] Follow [facility].   Specialty: Home Health Services Why: to provide home health physical therapy Contact information: [address] [place] [address] [phone]         Mcarthur Rossetti, MD. Go on 05/13/2022.   Specialty: Orthopedic Surgery Why: at 2:30 pm for your first in office appointment with Dr. Clarita Leber information: [place] [address] [phone]                  Signed: Mcarthur Rossetti 05/13/2022, 7:31 AM

## 2022-05-13 NOTE — Progress Notes (Signed)
The patient is here today for first postoperative visit status post a left total hip arthroplasty.  She is having some foot and ankle swelling but has been compliant with a baby aspirin twice a day and wearing compressive socks.  She is 71 years old.  She denies any calf pain.  She has been working with outpatient therapy.  She is using a cane.  They have 1 more visit with her and will transition her to just self-care per their notes and how she is doing.  Her left hip incision looks good.  The staples been removed and Steri-Strips applied.  Her leg lengths are near equal.  At this point she will continue to increase her activities as comfort allows.  She will go back to her once a day baby aspirin and will still wear her compressive hose until the swelling is less with her feet.  She says it is less swollen in the morning and her calf is soft on my exam today.  I will refill her pain medication.  All question concerns were answered and addressed.  We will see her back in 4 weeks to see how she is doing overall but no x-rays are needed.

## 2022-05-13 NOTE — Telephone Encounter (Signed)
Ortho bundle 14 day in office meeting completed. °

## 2022-06-09 DIAGNOSIS — I10 Essential (primary) hypertension: Secondary | ICD-10-CM | POA: Diagnosis not present

## 2022-06-09 DIAGNOSIS — E782 Mixed hyperlipidemia: Secondary | ICD-10-CM | POA: Diagnosis not present

## 2022-06-16 ENCOUNTER — Ambulatory Visit: Payer: PPO | Admitting: Orthopaedic Surgery

## 2022-06-16 ENCOUNTER — Encounter: Payer: Self-pay | Admitting: Orthopaedic Surgery

## 2022-06-16 DIAGNOSIS — Z96642 Presence of left artificial hip joint: Secondary | ICD-10-CM

## 2022-06-16 MED ORDER — HYDROCODONE-ACETAMINOPHEN 5-325 MG PO TABS: 1.0000 | ORAL_TABLET | Freq: Three times a day (TID) | ORAL | 0 refills | Status: DC | PRN

## 2022-06-16 NOTE — Progress Notes (Signed)
The patient is an active 71 year old female who is 6 weeks status post a left total hip replacement.  She still has some tenderness but overall is doing well.  She does ambulate with a cane.  She has been driving as well.  She feels like she is making good progress.  Her left operative hip seems to move well.  There is still some stiffness to be expected.  She states that is much better than how she was before surgery.  She will continue to increase her activities as comfort allows.  The next time I need to see her back since 3 months from now.  We will have a standing low AP pelvis and a lateral of her left hip at that visit.  She is requesting a refill on pain medication.  She has been on oxycodone and I would like to try hydrocodone at this standpoint and she agrees.  I did send this in for her.  All questions and concerns were answered and addressed.

## 2022-06-30 ENCOUNTER — Encounter: Payer: Self-pay | Admitting: *Deleted

## 2022-07-01 ENCOUNTER — Telehealth: Payer: Self-pay | Admitting: Orthopaedic Surgery

## 2022-07-01 ENCOUNTER — Other Ambulatory Visit: Payer: Self-pay | Admitting: Orthopaedic Surgery

## 2022-07-01 MED ORDER — METHOCARBAMOL 500 MG PO TABS: 500.0000 mg | ORAL_TABLET | Freq: Four times a day (QID) | ORAL | 1 refills | Status: DC | PRN

## 2022-07-01 NOTE — Telephone Encounter (Signed)
Patient would like meds refilled at [facility]  Jamie Hunt [address] #14  (methocarbamol) 500 mg

## 2022-07-06 ENCOUNTER — Telehealth: Payer: Self-pay | Admitting: *Deleted

## 2022-07-06 NOTE — Telephone Encounter (Signed)
Ortho bundle 30 day call completed. °

## 2022-07-06 NOTE — Telephone Encounter (Signed)
Ortho bundle 30 day call attempted; no answer and left VM requesting call back.

## 2022-07-10 DIAGNOSIS — I10 Essential (primary) hypertension: Secondary | ICD-10-CM | POA: Diagnosis not present

## 2022-07-10 DIAGNOSIS — F339 Major depressive disorder, recurrent, unspecified: Secondary | ICD-10-CM | POA: Diagnosis not present

## 2022-08-08 NOTE — Progress Notes (Unsigned)
GI Office Note    Referring Provider: Carrolyn Meiers* Primary Care Physician:  Carrolyn Meiers, MD Primary Gastroenterologist: Elon Alas. Abbey Chatters, DO  Date:  08/09/2022  ID:  Jamie Hunt, Jamie Hunt 1950/12/29, MRN [id number]   Chief Complaint   Chief Complaint  Patient presents with   Follow-up    Still having indigestion    History of Present Illness  Jamie Hunt is a 71 y.o. female with a history of GERD, dysphagia, constipation, anemia, HTN, and HLD presenting today for follow up on chronic GI issues.   EGD December 2022: mild schatzki ring s/p dilation, gastritis with reactive gastropathy, normal duodenum. No H. Pylori  Colonoscopy December 2022: non-bleeding internal hemorrhoids, transverse and ascending diverticulosis, two 7-9 mm polyps in the ascending and cecum with biopsy revealing tubular adenomas. Due for repeat colonoscopy in 2027.  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  Last office visit April 2023 she reportedly was okay noting symptoms with certain foods. Taking omeprazole once, sometimes twice daily. Reportedly having normal bowel movements with daily fiber supplementation and stool softeners. Had some weight loss likely secondary to eating smaller portions. Was to have hip surgery upcoming.   Today: Having reflux with raw vegetables, fried foods, salads, and sweets. May have symptoms that last a few hours versus all day. Does have some issues with tomato products. Drinks cranberry juice everyday but denies other citrus foods. Takes omeprazole 20 mg and has been taking it twice daily more frequently as well. Does report some rare nausea without vomiting. Denies dysphagia.   Takes fiber and a stool softener here and there as well. Sometimes she has a stool every day or at least every other day. It depends on what she heats. Unable to pinpoint any specific triggers. Denies abdominal pain on a regular  basis. Denies melena or hematochezia. Denies any pepto or mylanta use. Does report some bloating if she over eats. Notices that if she eats less then she does feel better. Does eat hamburger every now and then. Cabbage, beans, legumes, onions are particularly bothersome for her.     Current Outpatient Medications  Medication Sig Dispense Refill   acetaminophen (TYLENOL) 650 MG CR tablet Take 1,300 mg by mouth in the morning and at bedtime.     aspirin 81 MG chewable tablet Chew 1 tablet (81 mg total) by mouth 2 (two) times daily. 30 tablet 0   Calcium Citrate-Vitamin D (CALCIUM + D PO) Take 1 tablet by mouth every morning.     cholecalciferol (VITAMIN D3) 25 MCG (1000 UT) tablet Take 1,000 Units by mouth in the morning.     docusate sodium (COLACE) 100 MG capsule Take 100 mg by mouth 2 (two) times daily.     escitalopram (LEXAPRO) 10 MG tablet Take 10 mg by mouth at bedtime.     famciclovir (FAMVIR) 250 MG tablet Take 250 mg by mouth daily.     fluticasone (FLONASE) 50 MCG/ACT nasal spray Place 1 spray into both nostrils daily for 10 days. (Patient taking differently: Place 1 spray into both nostrils daily as needed for allergies (bronchitis).) 16 g 0   hydrALAZINE (APRESOLINE) 50 MG tablet Take 50 mg by mouth 2 (two) times a day.   3   hydrochlorothiazide (HYDRODIURIL) 25 MG tablet Take 25 mg by mouth in the morning.     HYDROcodone-acetaminophen (NORCO/VICODIN) 5-325 MG tablet Take 1-2 tablets by mouth 3 (three) times daily as needed for moderate pain.  30 tablet 0   losartan (COZAAR) 100 MG tablet Take 100 mg by mouth in the morning.     lovastatin (MEVACOR) 20 MG tablet Take 20 mg by mouth at bedtime.     Menthol, Topical Analgesic, (BIOFREEZE) 10 % CREA Apply 1 Application topically daily as needed (pain). Sometimes use roll-on     methocarbamol (ROBAXIN) 500 MG tablet Take 1 tablet (500 mg total) by mouth every 6 (six) hours as needed for muscle spasms. 30 tablet 1   montelukast (SINGULAIR)  10 MG tablet Take 10 mg by mouth at bedtime.     Multiple Vitamin (MULTIVITAMIN WITH MINERALS) TABS tablet Take 1 tablet by mouth in the morning.     ondansetron (ZOFRAN) 4 MG tablet Take 1 tablet (4 mg total) by mouth every 8 (eight) hours as needed for nausea or vomiting. 20 tablet 0   Polyethyl Glycol-Propyl Glycol (SYSTANE OP) Place 1 drop into both eyes 3 (three) times daily.     Potassium 99 MG TABS Take 99 mg by mouth in the morning.     traZODone (DESYREL) 50 MG tablet Take 50 mg by mouth at bedtime.     verapamil (CALAN-SR) 240 MG CR tablet Take 240 mg by mouth at bedtime.     omeprazole (PRILOSEC) 40 MG capsule Take 1 capsule (40 mg total) by mouth 2 (two) times daily. 60 capsule 3   No current facility-administered medications for this visit.    Past Medical History:  Diagnosis Date   Acid reflux    Anemia    Arthritis    High cholesterol    Hypertension    LBBB (left bundle branch block)    PONV (postoperative nausea and vomiting)    Shingles     Past Surgical History:  Procedure Laterality Date   ABDOMINAL HYSTERECTOMY  1995   BACK SURGERY  2014   L4-L5   BALLOON DILATION N/A 09/11/2021   Procedure: BALLOON DILATION;  Surgeon: Eloise Harman, DO;  Location: [facility];  Service: Endoscopy;  Laterality: N/A;   BIOPSY  09/11/2021   Procedure: BIOPSY;  Surgeon: Eloise Harman, DO;  Location: [facility];  Service: Endoscopy;;   CATARACT EXTRACTION W/PHACO Right 05/18/2019   Procedure: CATARACT EXTRACTION PHACO AND INTRAOCULAR LENS PLACEMENT (IOC);  Surgeon: [name], MD;  Location: [facility];  Service: Ophthalmology;  Laterality: Right;  CDE: 6.76   CATARACT EXTRACTION W/PHACO Left 06/01/2019   Procedure: CATARACT EXTRACTION PHACO AND INTRAOCULAR LENS PLACEMENT (IOC);  Surgeon: [name], MD;  Location: [facility];  Service: Ophthalmology;  Laterality: Left;  left, CDE: 8.69   CHOLECYSTECTOMY  2000   COLONOSCOPY  05/08/2009   SLF:A few scattered  diverticula throughout the entire colon.  Small  internal hemorrhoids.  Otherwise, no polyps, masses, inflammatory changes, or AVMs seen.  Normal retroflexed view of the rectum   COLONOSCOPY N/A 06/03/2014   SLF:  1. six colon polyps removed. 2. Moderate sized internal hemorrhoids. 3. The left colon is redundant 4.  Moderate diverticulosis throughout the entire examed colon. next TCS in10 years with overtube.   COLONOSCOPY WITH PROPOFOL N/A 09/11/2021   Procedure: COLONOSCOPY WITH PROPOFOL;  Surgeon: Eloise Harman, DO;  Location: [facility];  Service: Endoscopy;  Laterality: N/A;  10:45am   ESOPHAGOGASTRODUODENOSCOPY N/A 06/03/2014   SLF:  1. small hiatal hernia   ESOPHAGOGASTRODUODENOSCOPY N/A 02/16/2016   slf: gastritis, schazki ring   ESOPHAGOGASTRODUODENOSCOPY (EGD) WITH PROPOFOL N/A 09/11/2021   Procedure: ESOPHAGOGASTRODUODENOSCOPY (EGD)  WITH PROPOFOL;  Surgeon: Eloise Harman, DO;  Location: [facility];  Service: Endoscopy;  Laterality: N/A;   GIVENS CAPSULE STUDY N/A 03/19/2016   Procedure: GIVENS CAPSULE STUDY;  Surgeon: Danie Binder, MD;  Location: [facility];  Service: Endoscopy;  Laterality: N/A;  0700   TOTAL HIP ARTHROPLASTY Left 04/30/2022   Procedure: LEFT TOTAL HIP ARTHROPLASTY ANTERIOR APPROACH;  Surgeon: Mcarthur Rossetti, MD;  Location: [facility];  Service: Orthopedics;  Laterality: Left;   VASCULAR SURGERY  1992   varicose veins    Family History  Problem Relation Age of Onset   Colon cancer Neg Hx    Liver disease Neg Hx     Allergies as of 08/09/2022 - Review Complete 08/09/2022  Allergen Reaction Noted   Anesthetics, ester Nausea And Vomiting 04/22/2022   Codeine Nausea And Vomiting 04/22/2014   Penicillins  11/30/2011    Social History   Socioeconomic History   Marital status: Widowed    Spouse name: Not on file   Number of children: Not on file   Years of education: Not on file   Highest education level: Not on file  Occupational  History   Not on file  Tobacco Use   Smoking status: Never   Smokeless tobacco: Never   Tobacco comments:    Never smoked  Vaping Use   Vaping Use: Never used  Substance and Sexual Activity   Alcohol use: No    Alcohol/week: 0.0 standard drinks of alcohol   Drug use: No   Sexual activity: Not Currently  Other Topics Concern   Not on file  Social History Narrative   Not on file   Social Determinants of Health   Financial Resource Strain: Not on file  Food Insecurity: Not on file  Transportation Needs: Not on file  Physical Activity: Not on file  Stress: Not on file  Social Connections: Not on file     Review of Systems   Gen: Denies fever, chills, anorexia. Denies fatigue, weakness, weight loss.  CV: Denies chest pain, palpitations, syncope, peripheral edema, and claudication. Resp: Denies dyspnea at rest, cough, wheezing, coughing up blood, and pleurisy. GI: See HPI Derm: Denies rash, itching, dry skin Psych: Denies depression, anxiety, memory loss, confusion. No homicidal or suicidal ideation.  Heme: Denies bruising, bleeding, and enlarged lymph nodes.   Physical Exam   BP (!) 161/77 (BP Location: Right Arm, Patient Position: Sitting, Cuff Size: Large)   Pulse 69   Temp (!) 97.4 F (36.3 C) (Oral)   Ht '5\' 11"'$  (1.803 m)   Wt 216 lb 3.2 oz (98.1 kg)   SpO2 96%   BMI 30.15 kg/m   General:   Alert and oriented. No distress noted. Pleasant and cooperative.  Head:  Normocephalic and atraumatic. Eyes:  Conjuctiva clear without scleral icterus. Mouth:  Oral mucosa pink and moist. Good dentition. No lesions. Lungs:  Clear to auscultation bilaterally. No wheezes, rales, or rhonchi. No distress.  Heart:  S1, S2 present without murmurs appreciated.  Abdomen:  +BS, soft, non-tender and non-distended. No rebound or guarding. No HSM or masses noted. Rectal: deferred.  Msk:  Symmetrical without gross deformities. Normal posture. Extremities:  Without edema. Neurologic:   Alert and oriented x4 Psych:  Alert and cooperative. Normal mood and affect.   Assessment  Jamie Hunt is a 71 y.o. female with a history of GERD, dysphagia, constipation, anemia, HTN, and HLD presenting today for follow up on chronic GI issues.  GERD: Chronic. Denies any dysphagia. Has been noticing an increase in frequency of her reflux secondary to dietary choices such as raw vegetables, fried foods, and salads. Recently has been taking omeprazole 20 mg BID and still has occasional symptoms. Has had rare episodes of nausea. Prefers to not eliminate a lot of foods. I advised to avoid her triggers as much as possible and that we will increase her omeprazole to 40 mg BID and may consider changing formulations if she does not improve.   Constipation: Associated with some bloating. Continues to take daily stool softener and fiber as needed. Typically having a BM at least every other day if not everyday. This is largely dependent on her diet. Denies melena or BRBPR, or abdominal pain. Took miralax after her hip replacement and that worked well but stopped after a couple of weeks. Advised that she should use fiber daily if she would like to wean off stool softener since she is fairly controlled right now. Advised gas-ex as needed if ongoing bloating.   PLAN   Increase omeprazole to 40 mg twice daily GERD diet/lifestyle modifications.  Continue metamucil 2 teaspoons daily Gas-X as needed May wean off stool softener Follow up in 4 months.    Venetia Night, MSN, FNP-BC, AGACNP-BC [facility]

## 2022-08-09 ENCOUNTER — Encounter: Payer: Self-pay | Admitting: Gastroenterology

## 2022-08-09 ENCOUNTER — Ambulatory Visit: Payer: PPO | Admitting: Gastroenterology

## 2022-08-09 VITALS — BP 161/77 | HR 69 | Temp 97.4°F | Ht 71.0 in | Wt 216.2 lb

## 2022-08-09 DIAGNOSIS — K219 Gastro-esophageal reflux disease without esophagitis: Secondary | ICD-10-CM | POA: Diagnosis not present

## 2022-08-09 DIAGNOSIS — K5900 Constipation, unspecified: Secondary | ICD-10-CM | POA: Diagnosis not present

## 2022-08-09 MED ORDER — OMEPRAZOLE 40 MG PO CPDR: 40.0000 mg | DELAYED_RELEASE_CAPSULE | Freq: Two times a day (BID) | ORAL | 3 refills | Status: DC

## 2022-08-09 MED ORDER — OMEPRAZOLE 40 MG PO CPDR: 40.0000 mg | DELAYED_RELEASE_CAPSULE | Freq: Every day | ORAL | 3 refills | Status: DC

## 2022-08-09 NOTE — Patient Instructions (Addendum)
I want you to begin taking omeprazole 40 mg twice daily.  I have sent in a new prescription to your pharmacy.  Want you to follow a GERD diet:  Avoid fried, fatty, greasy, spicy, citrus foods. Avoid caffeine and carbonated beverages. Avoid chocolate. Try eating 4-6 small meals a day rather than 3 large meals. Do not eat within 3 hours of laying down. Prop head of bed up on wood or bricks to create a 6 inch incline.  I will try focusing on grilling, baking, or boiling your meats.  I believe the higher fat content in the oil used in fried foods may be contributing to your reflux.  Also the consumption of raw vegetables can contribute to increased bloating.  You may use over-the-counter Gas-X or Phazyme as needed.  Want you to continue taking your Metamucil, but I would like for you to increase it to 2 teaspoons daily mixed in 8 ounces of liquid. You may continue your daily stool softener if you wish or you may try to wean off given we are increasing your fiber.  It is safe to resume if you have worsening constipation.  Will have you follow up in 4 months to reassess your reflux and constipation.   It was a pleasure to see you today. I want to create trusting relationships with patients. If you receive a survey regarding your visit,  I greatly appreciate you taking time to fill this out on paper or through your MyChart. I value your feedback.  Venetia Night, MSN, FNP-BC, AGACNP-BC [facility]

## 2022-08-13 DIAGNOSIS — I10 Essential (primary) hypertension: Secondary | ICD-10-CM | POA: Diagnosis not present

## 2022-08-13 DIAGNOSIS — K219 Gastro-esophageal reflux disease without esophagitis: Secondary | ICD-10-CM | POA: Diagnosis not present

## 2022-08-13 DIAGNOSIS — Z23 Encounter for immunization: Secondary | ICD-10-CM | POA: Diagnosis not present

## 2022-08-13 DIAGNOSIS — F339 Major depressive disorder, recurrent, unspecified: Secondary | ICD-10-CM | POA: Diagnosis not present

## 2022-08-13 DIAGNOSIS — M1990 Unspecified osteoarthritis, unspecified site: Secondary | ICD-10-CM | POA: Diagnosis not present

## 2022-08-24 ENCOUNTER — Telehealth: Payer: Self-pay | Admitting: *Deleted

## 2022-08-24 NOTE — Telephone Encounter (Signed)
Attempted 90 day call after left total hip arthroplasty requesting call back.

## 2022-08-25 ENCOUNTER — Telehealth: Payer: Self-pay | Admitting: *Deleted

## 2022-08-25 NOTE — Telephone Encounter (Signed)
90 day call completed. 

## 2022-09-12 DIAGNOSIS — E785 Hyperlipidemia, unspecified: Secondary | ICD-10-CM | POA: Diagnosis not present

## 2022-09-12 DIAGNOSIS — I10 Essential (primary) hypertension: Secondary | ICD-10-CM | POA: Diagnosis not present

## 2022-09-15 ENCOUNTER — Encounter: Payer: Self-pay | Admitting: Orthopaedic Surgery

## 2022-09-15 ENCOUNTER — Ambulatory Visit (INDEPENDENT_AMBULATORY_CARE_PROVIDER_SITE_OTHER): Payer: PPO | Admitting: Orthopaedic Surgery

## 2022-09-15 ENCOUNTER — Ambulatory Visit (INDEPENDENT_AMBULATORY_CARE_PROVIDER_SITE_OTHER): Payer: PPO

## 2022-09-15 DIAGNOSIS — Z96642 Presence of left artificial hip joint: Secondary | ICD-10-CM

## 2022-09-15 MED ORDER — HYDROCODONE-ACETAMINOPHEN 5-325 MG PO TABS: 1.0000 | ORAL_TABLET | Freq: Three times a day (TID) | ORAL | 0 refills | Status: DC | PRN

## 2022-09-15 MED ORDER — METHOCARBAMOL 500 MG PO TABS: 500.0000 mg | ORAL_TABLET | Freq: Four times a day (QID) | ORAL | 1 refills | Status: DC | PRN

## 2022-09-15 NOTE — Progress Notes (Signed)
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  She is 71 years old.  She still reports some pain when she is bending over and a little bit of numbness in her thigh but overall she is walking without assistive device and becoming more active.  Her arthritis was quite severe before surgery.  Her left operative hip moves more smoothly than it did before surgery.  There is still some slight stiffness.  There is subjective numbness around the incision and hopefully this will improve with time.  A standing AP pelvis and lateral of her left hip shows a well-seated total hip arthroplasty with no complicating features.  She is requesting another refill on pain medication and she is using this sparingly so I will send in some more hydrocodone and Robaxin for her.  I gave her reassurance that she should continue to improve with time.  I would still like to see her back in 6 months to see how she is doing overall and will have just a standing AP pelvis at that visit.  If there are issues before then she knows to let us know.

## 2022-10-13 DIAGNOSIS — I10 Essential (primary) hypertension: Secondary | ICD-10-CM | POA: Diagnosis not present

## 2022-10-13 DIAGNOSIS — K219 Gastro-esophageal reflux disease without esophagitis: Secondary | ICD-10-CM | POA: Diagnosis not present

## 2022-11-06 NOTE — Progress Notes (Unsigned)
GI Office Note    Referring Provider: Carrolyn Meiers* Primary Care Physician:  Carrolyn Meiers, MD Primary Gastroenterologist: Elon Alas. Abbey Chatters, DO  Date:  11/08/2022  ID:  Jamie Hunt, Jamie Hunt [birth year], MRN [id number]   Chief Complaint   Chief Complaint  Patient presents with   Follow-up    History of Present Illness  Jamie Hunt is a 72 y.o. female with a history of GERD, dysphagia, constipation, anemia, HTN, and HLD presenting today for follow up.    EGD December 2022: - mild schatzki ring s/p dilation - gastritis with reactive gastropathy negative H. 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 Reflux with raw vegetables, fried foods, salads and sweets. Issues also with tomato products. Recently increased omeprazole to 20 mg twice daily on most days. Rare nausea. Taking fiber supplements and stool softener daily. Stools dia ly or every other day. Feels better if doesn't overeat. Advised to increase omeprazole to 40 mg BID. GERD diet/lifestyle modifications reinforced. Trial wean of stool softener given controlled Bms. F/u in 4 months.   Today: Constipation: Having a daily BM for the most part,. Occasionally skips a day. Stopped stool softener. Taking beano for gas about twice per day. Takes 2 spoonfuls of fiber every morning as well (Metamucil). No melena or BRBPR.   States she has gained about 8 pounds. Does not believe she is eating more than usual.   GERD: Taking omeprazole twice daily for the most part and ran out prior to her refill and beano twice daily. Still has some occasional reflux with raw vegetables. Symptoms worse if she overeats. Also if  she eats fried foods. Loves chocolate and that will flare up her reflux some. No N/V, dysphagia.  Rare abdominal discomfort midline.   Current Outpatient Medications  Medication Sig Dispense Refill   aspirin 81 MG chewable tablet Chew 1 tablet (81 mg total) by mouth 2 (two) times daily. 30 tablet 0   Calcium Citrate-Vitamin D (CALCIUM + D PO) Take 1 tablet by mouth every morning.     cholecalciferol (VITAMIN D3) 25 MCG (1000 UT) tablet Take 1,000 Units by mouth in the morning.     escitalopram (LEXAPRO) 10 MG tablet Take 10 mg by mouth at bedtime.     hydrochlorothiazide (HYDRODIURIL) 25 MG tablet Take 25 mg by mouth in the morning.     losartan (COZAAR) 100 MG tablet Take 100 mg by mouth in the morning.     lovastatin (MEVACOR) 20 MG tablet Take 20 mg by mouth at bedtime.     montelukast (SINGULAIR) 10 MG tablet Take 10 mg by mouth at bedtime.     Multiple Vitamin (MULTIVITAMIN WITH MINERALS) TABS tablet Take 1 tablet by mouth in the morning.     omeprazole (PRILOSEC) 40 MG capsule Take 1 capsule (40 mg total) by mouth 2 (two) times daily. 60 capsule 3   ondansetron (ZOFRAN) 4 MG tablet Take 1 tablet (4 mg total) by mouth every 8 (eight) hours as needed for nausea or vomiting. 20 tablet 0   Polyethyl Glycol-Propyl Glycol (SYSTANE OP) Place 1 drop into both eyes 3 (three) times daily.     Potassium 99 MG TABS Take 99 mg by mouth in the morning.  traZODone (DESYREL) 50 MG tablet Take 50 mg by mouth at bedtime.     verapamil (CALAN-SR) 240 MG CR tablet Take 240 mg by mouth at bedtime.     acetaminophen (TYLENOL) 650 MG CR tablet Take 1,300 mg by mouth in the morning and at bedtime.     famciclovir (FAMVIR) 250 MG tablet Take 250 mg by mouth daily.     hydrALAZINE (APRESOLINE) 50 MG tablet Take 50 mg by mouth 2 (two) times a day.   3   Menthol, Topical Analgesic, (BIOFREEZE) 10 % CREA Apply 1 Application topically daily as needed (pain). Sometimes use roll-on (Patient not taking: Reported on  11/08/2022)     methocarbamol (ROBAXIN) 500 MG tablet Take 1 tablet (500 mg total) by mouth every 6 (six) hours as needed for muscle spasms. (Patient not taking: Reported on 11/08/2022) 30 tablet 1   No current facility-administered medications for this visit.    Past Medical History:  Diagnosis Date   Acid reflux    Anemia    Arthritis    High cholesterol    Hypertension    LBBB (left bundle branch block)    PONV (postoperative nausea and vomiting)    Shingles     Past Surgical History:  Procedure Laterality Date   ABDOMINAL HYSTERECTOMY  1995   BACK SURGERY  2014   L4-L5   BALLOON DILATION N/A 09/11/2021   Procedure: BALLOON DILATION;  Surgeon: Eloise Harman, DO;  Location: [facility];  Service: Endoscopy;  Laterality: N/A;   BIOPSY  09/11/2021   Procedure: BIOPSY;  Surgeon: Eloise Harman, DO;  [facility]: [facility];  Service: Endoscopy;;   CATARACT EXTRACTION W/PHACO Right 05/18/2019   Procedure: CATARACT EXTRACTION PHACO AND INTRAOCULAR LENS PLACEMENT (IOC);  Surgeon: [name], MD;  Location: [facility];  Service: Ophthalmology;  Laterality: Right;  CDE: 6.76   CATARACT EXTRACTION W/PHACO Left 06/01/2019   Procedure: CATARACT EXTRACTION PHACO AND INTRAOCULAR LENS PLACEMENT (IOC);  Surgeon: [name], MD;  Location: [facility];  Service: Ophthalmology;  Laterality: Left;  left, CDE: 8.69   CHOLECYSTECTOMY  2000   COLONOSCOPY  05/08/2009   SLF:A few scattered diverticula throughout the entire colon.  Small  internal hemorrhoids.  Otherwise, no polyps, masses, inflammatory changes, or AVMs seen.  Normal retroflexed view of the rectum   COLONOSCOPY N/A 06/03/2014   SLF:  1. six colon polyps removed. 2. Moderate sized internal hemorrhoids. 3. The left colon is redundant 4.  Moderate diverticulosis throughout the entire examed colon. next TCS in10 years with overtube.   COLONOSCOPY WITH PROPOFOL N/A 09/11/2021   Procedure: COLONOSCOPY WITH PROPOFOL;  Surgeon: Eloise Harman, DO;  [facility]: [facility];  Service: Endoscopy;  Laterality: N/A;  10:45am   ESOPHAGOGASTRODUODENOSCOPY N/A 06/03/2014   SLF:  1. small hiatal hernia   ESOPHAGOGASTRODUODENOSCOPY N/A 02/16/2016   slf: gastritis, schazki ring   ESOPHAGOGASTRODUODENOSCOPY (EGD) WITH PROPOFOL N/A 09/11/2021   Procedure: ESOPHAGOGASTRODUODENOSCOPY (EGD) WITH PROPOFOL;  Surgeon: Eloise Harman, DO;  [facility];  Service: Endoscopy;  Laterality: N/A;   GIVENS CAPSULE STUDY N/A 03/19/2016   Procedure: GIVENS CAPSULE STUDY;  Surgeon: Danie Binder, MD;  Location: [facility];  Service: Endoscopy;  Laterality: N/A;  0700   TOTAL HIP ARTHROPLASTY Left 04/30/2022   Procedure: LEFT TOTAL HIP ARTHROPLASTY ANTERIOR APPROACH;  Surgeon: Mcarthur Rossetti, MD;  Location: [facility];  Service: Orthopedics;  Laterality: Left;   VASCULAR SURGERY  1992  varicose veins    Family History  Problem Relation Age of Onset   Colon cancer Neg Hx    Liver disease Neg Hx     Allergies as of 11/08/2022 - Review Complete 11/08/2022  Allergen Reaction Noted   Anesthetics, ester Nausea And Vomiting 04/22/2022   Codeine Nausea And Vomiting 04/22/2014   Penicillins  11/30/2011    Social History   Socioeconomic History   Marital status: Widowed    Spouse name: Not on file   Number of children: Not on file   Years of education: Not on file   Highest education level: Not on file  Occupational History   Not on file  Tobacco Use   Smoking status: Never   Smokeless tobacco: Never   Tobacco comments:    Never smoked  Vaping Use   Vaping Use: Never used  Substance and Sexual Activity   Alcohol use: No    Alcohol/week: 0.0 standard drinks of alcohol   Drug use: No   Sexual activity: Not Currently  Other Topics Concern   Not on file  Social History Narrative   Not on file   Social Determinants of Health   Financial Resource Strain: Not on file  Food Insecurity: Not on file   Transportation Needs: Not on file  Physical Activity: Not on file  Stress: Not on file  Social Connections: Not on file     Review of Systems   Gen: Denies fever, chills, anorexia. Denies fatigue, weakness, weight loss.  CV: Denies chest pain, palpitations, syncope, peripheral edema, and claudication. Resp: Denies dyspnea at rest, cough, wheezing, coughing up blood, and pleurisy. GI: See HPI Derm: Denies rash, itching, dry skin Psych: Denies depression, anxiety, memory loss, confusion. No homicidal or suicidal ideation.  Heme: Denies bruising, bleeding, and enlarged lymph nodes.   Physical Exam   BP (!) 149/74 (BP Location: Right Arm, Patient Position: Sitting, Cuff Size: Normal)   Pulse 73   Temp 97.7 F (36.5 C) (Temporal)   Ht '5\' 11"'$  (1.803 m)   Wt 221 lb 9.6 oz (100.5 kg)   SpO2 97%   BMI 30.91 kg/m   General:   Alert and oriented. No distress noted. Pleasant and cooperative.  Head:  Normocephalic and atraumatic. Eyes:  Conjuctiva clear without scleral icterus. Mouth:  Oral mucosa pink and moist. Good dentition. No lesions. Lungs:  Clear to auscultation bilaterally. No wheezes, rales, or rhonchi. No distress.  Heart:  S1, S2 present without murmurs appreciated.  Abdomen:  +BS, soft, non-tender and non-distended. No rebound or guarding. No HSM or masses noted. Rectal: deferred Msk:  Symmetrical without gross deformities. Normal posture. Extremities:  Without edema. Neurologic:  Alert and  oriented x4 Psych:  Alert and cooperative. Normal mood and affect.   Assessment  Jamie Hunt is a 72 y.o. female with a history of  GERD, dysphagia, constipation, anemia, HTN, and HLD presenting today for follow up.    Constipation: Controlled with daily fiber supplementation. Uses 2 spoonfuls daily. No melena or BRBPR.   GERD: EGD in December 2022 with reactive gastritis. Previously recommended increasing omeprazole to BID which she did briefly however she ran out of supply  and then began again only taking 1 daily and states symptoms are manageable on daily dosing and only having breakthrough with dietary indiscretion. Advised to continue daily dosing and that she may use pepcid as needed for breakthrough and see if this would be helpful vs beano which she takes twice daily. Beano  is helpful for gas buildup.   PLAN   Continue daily fiber supplement Continue omeprazole 40 mg daily May take pepcid 20 mg once to twice daily as needed for breakthrough Continue beano twice daily as needed. GERD diet/lifestyle modifications.  F/u in 6 months.     Venetia Night, MSN, FNP-BC, AGACNP-BC [facility]

## 2022-11-08 ENCOUNTER — Ambulatory Visit (INDEPENDENT_AMBULATORY_CARE_PROVIDER_SITE_OTHER): Payer: PPO | Admitting: Gastroenterology

## 2022-11-08 ENCOUNTER — Encounter: Payer: Self-pay | Admitting: Gastroenterology

## 2022-11-08 VITALS — BP 149/74 | HR 73 | Temp 97.7°F | Ht 71.0 in | Wt 221.6 lb

## 2022-11-08 DIAGNOSIS — K5900 Constipation, unspecified: Secondary | ICD-10-CM

## 2022-11-08 DIAGNOSIS — K219 Gastro-esophageal reflux disease without esophagitis: Secondary | ICD-10-CM

## 2022-11-08 NOTE — Patient Instructions (Addendum)
Continue omeprazole 40 mg once daily, 30 minutes prior to breakfast.  For any breakthroughs or dietary indiscretion you may take famotidine 20 mg once daily as needed.  You may take this instead of taking Beano to see if this also helps with gas or you may take both depending on how you feel.  Continue your daily fiber supplement.  Follow a GERD diet:  Avoid fried, fatty, greasy, spicy, citrus foods. Avoid caffeine and carbonated beverages. Avoid chocolate. Try eating 4-6 small meals a day rather than 3 large meals. Do not eat within 3 hours of laying down. Prop head of bed up on wood or bricks to create a 6 inch incline.   We will plan to follow-up in 6 months, or sooner if needed.  Please not hesitate to call if you have any worsening symptoms or if you have any  difficulty with refills.  It was a pleasure to see you today. I want to create trusting relationships with patients. If you receive a survey regarding your visit,  I greatly appreciate you taking time to fill this out on paper or through your MyChart. I value your feedback.  Venetia Night, MSN, FNP-BC, AGACNP-BC [facility]

## 2022-11-13 DIAGNOSIS — K219 Gastro-esophageal reflux disease without esophagitis: Secondary | ICD-10-CM | POA: Diagnosis not present

## 2022-11-13 DIAGNOSIS — I10 Essential (primary) hypertension: Secondary | ICD-10-CM | POA: Diagnosis not present

## 2022-12-01 IMAGING — MR MR HIP*L* W/O CM
5 series · 36 of 40 positions shown · non-contrast
Comparison: X-ray 10/13/2021

CLINICAL DATA: Left hip pain for 2 years

EXAM:
MR OF THE LEFT HIP WITHOUT CONTRAST
TECHNIQUE: Multiplanar, multisequence MR imaging was performed. No intravenous
contrast was administered.

[Series 12: T1 · coronal · left · 4.0mm · 0.86mm/px · 8 of 30 slices shown]
[im 1/30]
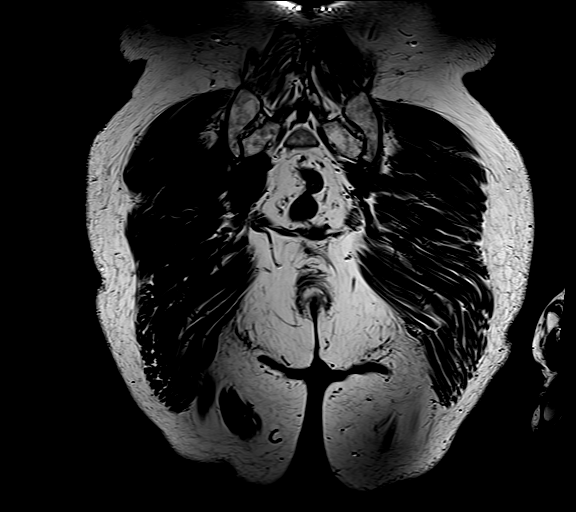
[im 5/30]
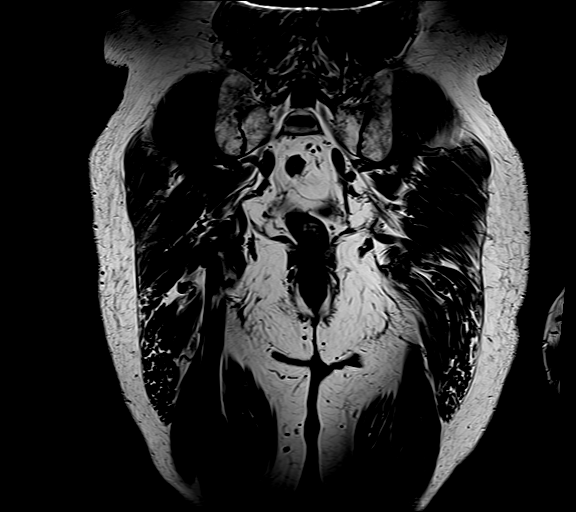
[im 9/30]
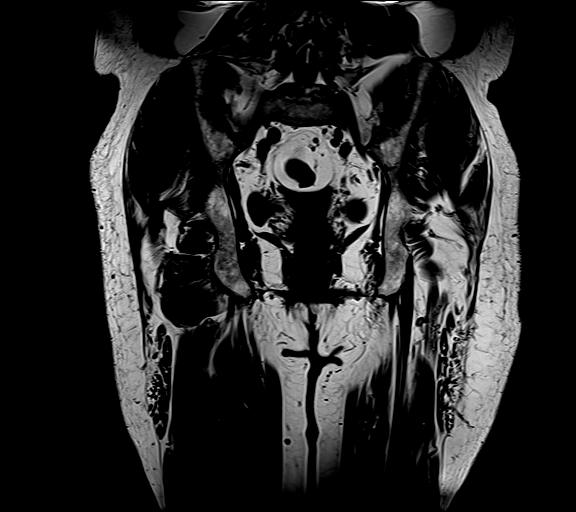
[im 13/30]
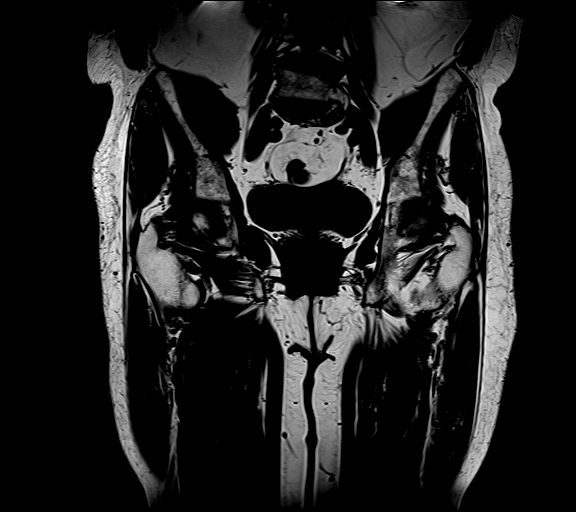
[im 17/30]
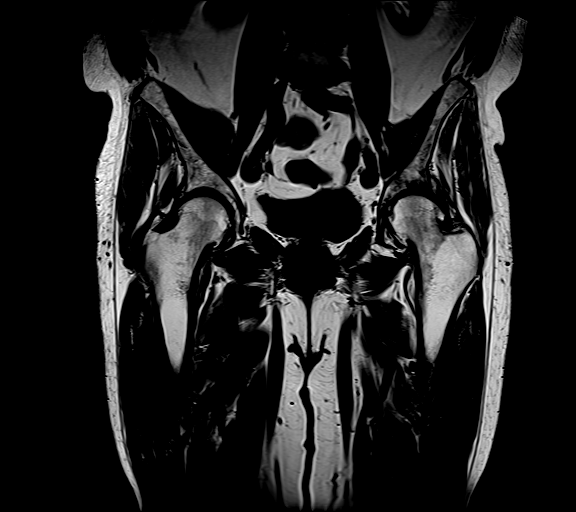
[im 21/30]
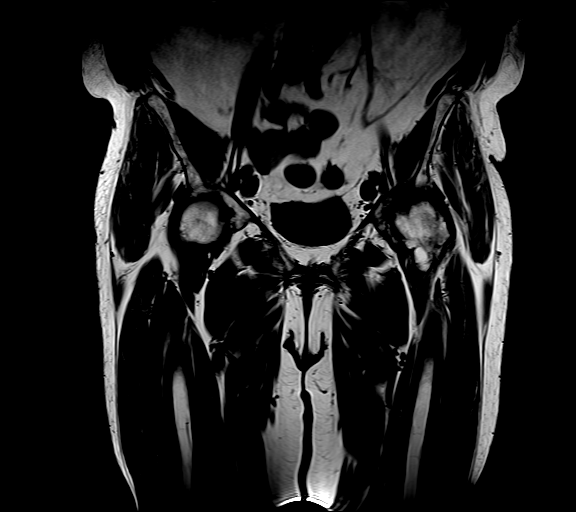
[im 25/30]
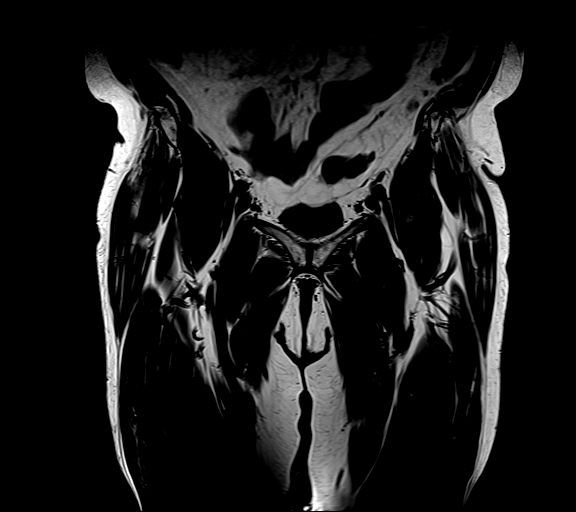
[im 30/30]
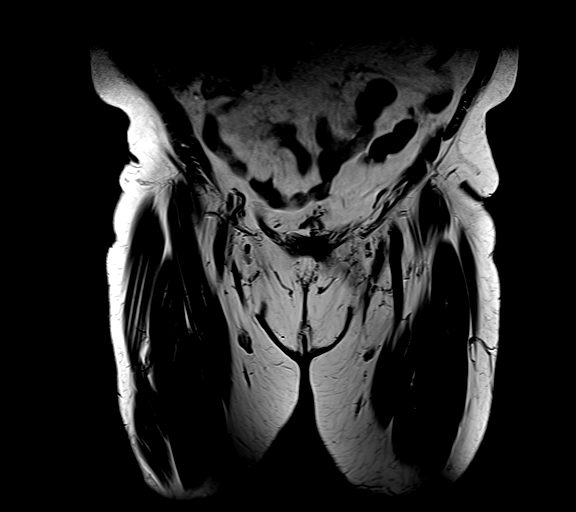

[Series 13: STIR · coronal · left · 4.0mm · 0.99mm/px · 7 of 30 slices shown]
[im 1/30]
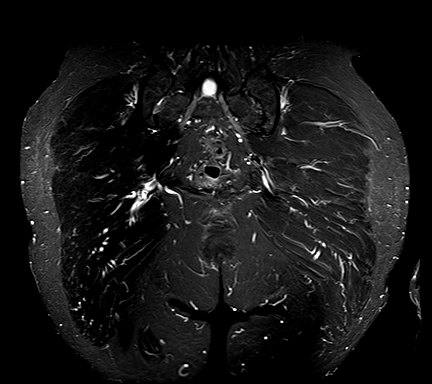
[im 4/30]
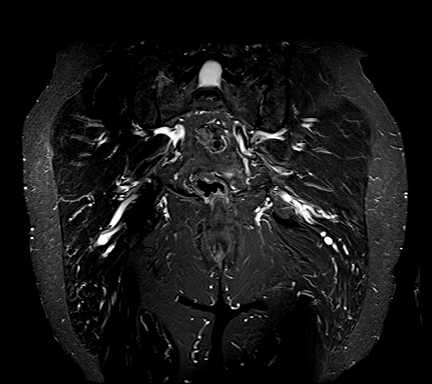
[im 8/30]
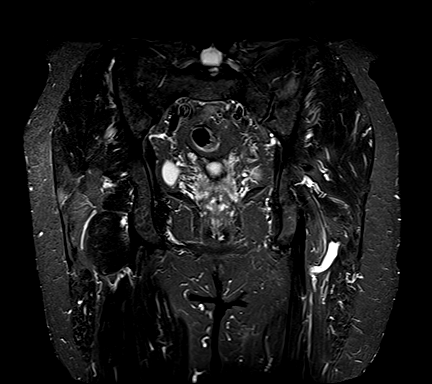
[im 11/30]
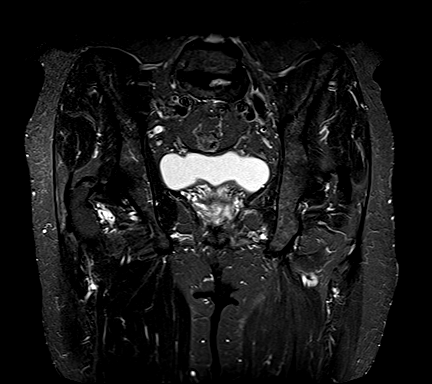
[im 19/30]
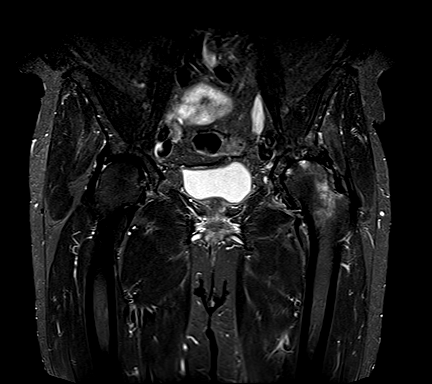
[im 22/30]
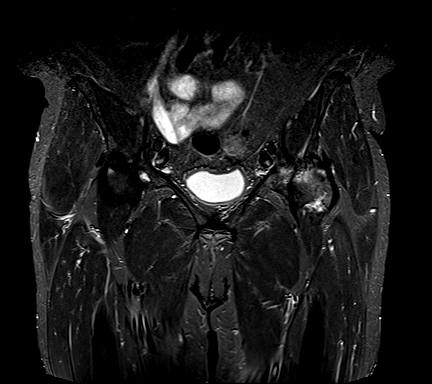
[im 26/30]
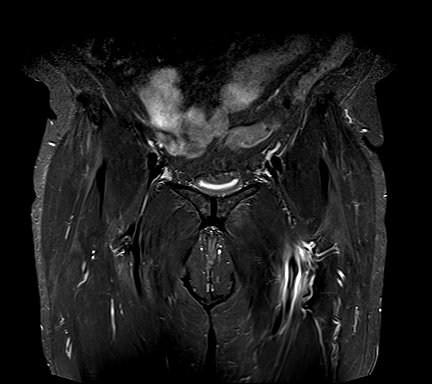

[Series 14: T2 fat-sat · axial · left · 4.0mm · 1.96mm/px · z∈[-51,+119]mm · 8 of 35 slices shown]
[im 1/35]
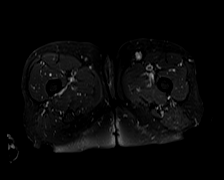
[im 4/35]
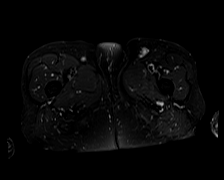
[im 12/35]
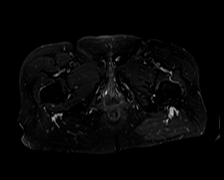
[im 16/35]
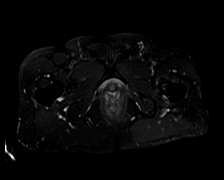
[im 19/35]
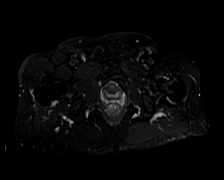
[im 23/35]
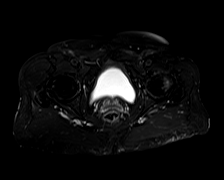
[im 31/35]
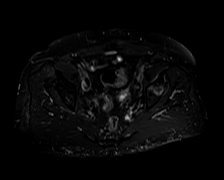
[im 35/35]
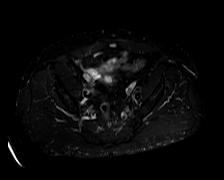

[Series 15: PD fat-sat · sagittal · left · 4.0mm · 0.70mm/px · 7 of 25 slices shown (1 of 2)]
[im 1/25]
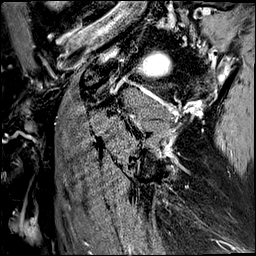
[im 5/25]
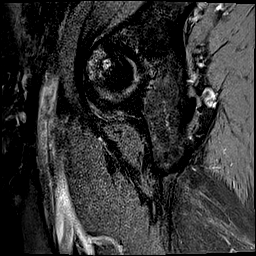
[im 9/25]
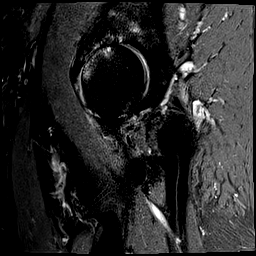
[im 13/25]
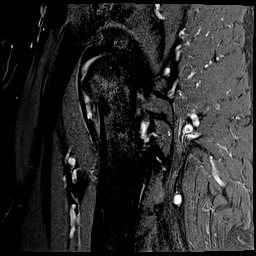
[im 17/25]
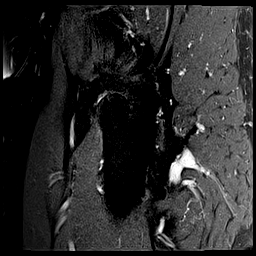
[im 21/25]
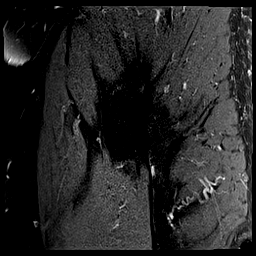
[im 25/25]
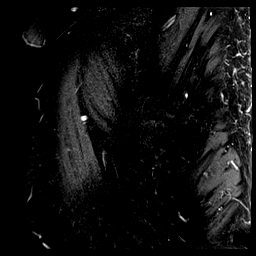

[Series 16: PD fat-sat · coronal · left · 4.0mm · 0.70mm/px · 6 of 20 slices shown (2 of 2)]
[im 1/20]
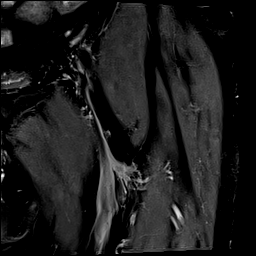
[im 4/20]
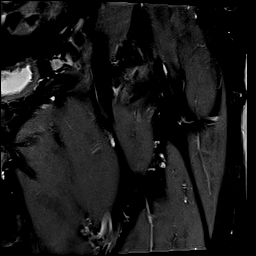
[im 8/20]
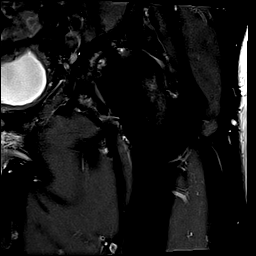
[im 12/20]
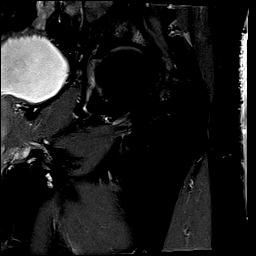
[im 16/20]
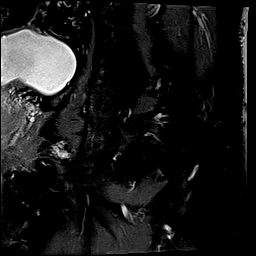
[im 20/20]
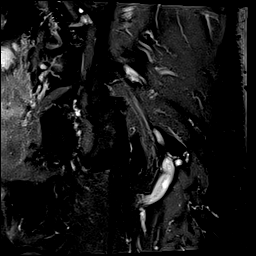

[36 of 40 positions shown; findings below may reference images not displayed]

FINDINGS: Bones: No acute fracture. No dislocation. No femoral head avascular
necrosis. Mild-moderate osteoarthritis of the right hip. No right
hip joint effusion. Bony pelvis intact without diastasis. SI joints
and pubic symphysis within normal limits. No bone marrow edema. No
marrow replacing bone lesion.

Articular cartilage and labrum

Articular cartilage: Concentric joint space narrowing with extensive
full-thickness cartilage loss of the femoral head and acetabulum.
Multifocal subchondral cystic changes along both sides of the joint
with patchy subchondral bone marrow edema. Bulky femoral head
marginal osteophytes.

Labrum: Circumferentially degenerated and likely partially ossified.

Joint or bursal effusion

Joint effusion:  None.

Bursae: Trace right peritrochanteric bursal fluid.

Muscles and tendons

Muscles and tendons: The gluteal, hamstring, iliopsoas, rectus
femoris, and adductor tendons appear intact without tear or
significant tendinosis. Normal muscle bulk and signal intensity
without edema, atrophy, or fatty infiltration.

Other findings

Miscellaneous: No soft tissue edema or fluid collection. No acute
findings within the pelvis.
IMPRESSION: 1. Severe osteoarthritis of the left hip.
2. Mild-moderate osteoarthritis of the right hip with trace right
peritrochanteric bursal fluid.

## 2022-12-09 ENCOUNTER — Encounter: Payer: Self-pay | Admitting: Radiology

## 2022-12-12 DIAGNOSIS — K219 Gastro-esophageal reflux disease without esophagitis: Secondary | ICD-10-CM | POA: Diagnosis not present

## 2022-12-12 DIAGNOSIS — I10 Essential (primary) hypertension: Secondary | ICD-10-CM | POA: Diagnosis not present

## 2022-12-16 ENCOUNTER — Encounter: Payer: Self-pay | Admitting: Radiology

## 2022-12-20 DIAGNOSIS — M79674 Pain in right toe(s): Secondary | ICD-10-CM | POA: Diagnosis not present

## 2022-12-20 DIAGNOSIS — L851 Acquired keratosis [keratoderma] palmaris et plantaris: Secondary | ICD-10-CM | POA: Diagnosis not present

## 2022-12-20 DIAGNOSIS — M2041 Other hammer toe(s) (acquired), right foot: Secondary | ICD-10-CM | POA: Diagnosis not present

## 2023-01-12 DIAGNOSIS — K219 Gastro-esophageal reflux disease without esophagitis: Secondary | ICD-10-CM | POA: Diagnosis not present

## 2023-01-12 DIAGNOSIS — I10 Essential (primary) hypertension: Secondary | ICD-10-CM | POA: Diagnosis not present

## 2023-01-31 DIAGNOSIS — M2041 Other hammer toe(s) (acquired), right foot: Secondary | ICD-10-CM | POA: Diagnosis not present

## 2023-01-31 DIAGNOSIS — L851 Acquired keratosis [keratoderma] palmaris et plantaris: Secondary | ICD-10-CM | POA: Diagnosis not present

## 2023-01-31 DIAGNOSIS — M79674 Pain in right toe(s): Secondary | ICD-10-CM | POA: Diagnosis not present

## 2023-02-11 DIAGNOSIS — E785 Hyperlipidemia, unspecified: Secondary | ICD-10-CM | POA: Diagnosis not present

## 2023-02-11 DIAGNOSIS — Z0001 Encounter for general adult medical examination with abnormal findings: Secondary | ICD-10-CM | POA: Diagnosis not present

## 2023-02-11 DIAGNOSIS — Z1389 Encounter for screening for other disorder: Secondary | ICD-10-CM | POA: Diagnosis not present

## 2023-02-11 DIAGNOSIS — F3340 Major depressive disorder, recurrent, in remission, unspecified: Secondary | ICD-10-CM | POA: Diagnosis not present

## 2023-02-11 DIAGNOSIS — M1990 Unspecified osteoarthritis, unspecified site: Secondary | ICD-10-CM | POA: Diagnosis not present

## 2023-02-11 DIAGNOSIS — Z6833 Body mass index (BMI) 33.0-33.9, adult: Secondary | ICD-10-CM | POA: Diagnosis not present

## 2023-02-11 DIAGNOSIS — K219 Gastro-esophageal reflux disease without esophagitis: Secondary | ICD-10-CM | POA: Diagnosis not present

## 2023-02-11 DIAGNOSIS — I10 Essential (primary) hypertension: Secondary | ICD-10-CM | POA: Diagnosis not present

## 2023-02-11 DIAGNOSIS — Z1331 Encounter for screening for depression: Secondary | ICD-10-CM | POA: Diagnosis not present

## 2023-02-14 ENCOUNTER — Other Ambulatory Visit (HOSPITAL_COMMUNITY): Payer: Self-pay | Admitting: Internal Medicine

## 2023-02-14 DIAGNOSIS — Z1231 Encounter for screening mammogram for malignant neoplasm of breast: Secondary | ICD-10-CM

## 2023-03-14 DIAGNOSIS — I10 Essential (primary) hypertension: Secondary | ICD-10-CM | POA: Diagnosis not present

## 2023-03-14 DIAGNOSIS — K219 Gastro-esophageal reflux disease without esophagitis: Secondary | ICD-10-CM | POA: Diagnosis not present

## 2023-03-16 ENCOUNTER — Other Ambulatory Visit: Payer: Self-pay | Admitting: Gastroenterology

## 2023-03-16 ENCOUNTER — Ambulatory Visit: Payer: PPO | Attending: Nurse Practitioner | Admitting: Nurse Practitioner

## 2023-03-16 ENCOUNTER — Encounter: Payer: Self-pay | Admitting: Nurse Practitioner

## 2023-03-16 VITALS — BP 140/80 | HR 69 | Ht 71.0 in | Wt 218.4 lb

## 2023-03-16 DIAGNOSIS — I440 Atrioventricular block, first degree: Secondary | ICD-10-CM | POA: Diagnosis not present

## 2023-03-16 DIAGNOSIS — I700 Atherosclerosis of aorta: Secondary | ICD-10-CM | POA: Diagnosis not present

## 2023-03-16 DIAGNOSIS — I10 Essential (primary) hypertension: Secondary | ICD-10-CM

## 2023-03-16 DIAGNOSIS — I447 Left bundle-branch block, unspecified: Secondary | ICD-10-CM

## 2023-03-16 DIAGNOSIS — K219 Gastro-esophageal reflux disease without esophagitis: Secondary | ICD-10-CM

## 2023-03-16 DIAGNOSIS — E785 Hyperlipidemia, unspecified: Secondary | ICD-10-CM

## 2023-03-16 MED ORDER — ROSUVASTATIN CALCIUM 40 MG PO TABS: 40.0000 mg | ORAL_TABLET | Freq: Every day | ORAL | 3 refills | Status: DC

## 2023-03-16 MED ORDER — HYDRALAZINE HCL 50 MG PO TABS: 50.0000 mg | ORAL_TABLET | Freq: Three times a day (TID) | ORAL | 3 refills | Status: AC

## 2023-03-16 NOTE — Progress Notes (Signed)
Cardiology Office Note:    Date:  03/16/2023   ID:  Jamie Hunt, DOB June 19, 1951, MRN [id number]  PCP:  Benetta Spar, MD   [facility] Providers Cardiologist:  Orbie Pyo, MD     Referring MD: Benetta Spar*   Chief Complaint: annual follow-up hypertension, hyperlipidemia  History of Present Illness:    Jamie Hunt is a very pleasant 72 y.o. female with a hx of first degree AV block, LBBB, hypertension, hyperlipidemia, and aortic atherosclerosis.   Referred to cardiology and seen on 02/10/2022 by Dr. Lynnette Caffey for preoperative cardiac evaluation for upcoming left hip arthroplasty.  An EKG demonstrated a new left bundle branch block compared to previous EKG in 2014.  Reported she is able to do some activities of daily living but uses a cane and is very slow with this.  EKG at our office demonstrated sinus rhythm with first-degree AV block and left bundle branch block.  She had no prior CV testing.  CT of abdomen and pelvis in 2023 demonstrated aortic atherosclerosis without aneurysm.   She underwent echocardiogram on 03/01/2022 which revealed normal LVEF 60 to 65%, no RWMA, G1 DD, normal RV size and function and no significant valve disease.  Lexiscan Myoview obtained 03/01/2022 which revealed mild thinning inferior septal base consistent with probable diaphragmatic attenuation, no significant ischemia or scar, low risk study. Felt to be at low risk for upcoming surgery.  Today, she is here for one year follow-up. Reports she is doing well. Typically BP is higher at provider appointments but admits she has been getting more readings above goal recently.  Gradually feeling more fatigued over the years since retirement. Walks on treadmill 30 minutes 5 days per week. Occasional dizziness, has always related it to sinus issues, not significant.  No presyncope or syncope. Bilateral ankle edema. She denies chest pain, dyspnea, orthopnea, PND. Admits to not eating  healthy - eats a lot of sweets, eats out on occasion, and eats fried foods frequently.   Past Medical History:  Diagnosis Date   Acid reflux    Anemia    Arthritis    High cholesterol    Hypertension    LBBB (left bundle branch block)    PONV (postoperative nausea and vomiting)    Shingles     Past Surgical History:  Procedure Laterality Date   ABDOMINAL HYSTERECTOMY  1995   BACK SURGERY  2014   L4-L5   BALLOON DILATION N/A 09/11/2021   Procedure: BALLOON DILATION;  Surgeon: Lanelle Bal, DO;  [facility]: [facility];  Service: Endoscopy;  Laterality: N/A;   BIOPSY  09/11/2021   Procedure: BIOPSY;  Surgeon: Lanelle Bal, DO;  Location: [facility];  Service: Endoscopy;;   CATARACT EXTRACTION W/PHACO Right 05/18/2019   Procedure: CATARACT EXTRACTION PHACO AND INTRAOCULAR LENS PLACEMENT (IOC);  Surgeon: Fabio Pierce, MD;  Location: [facility];  Service: Ophthalmology;  Laterality: Right;  CDE: 6.76   CATARACT EXTRACTION W/PHACO Left 06/01/2019   Procedure: CATARACT EXTRACTION PHACO AND INTRAOCULAR LENS PLACEMENT (IOC);  Surgeon: Fabio Pierce, MD;  Location: [facility];  Service: Ophthalmology;  Laterality: Left;  left, CDE: 8.69   CHOLECYSTECTOMY  2000   COLONOSCOPY  05/08/2009   SLF:A few scattered diverticula throughout the entire colon.  Small  internal hemorrhoids.  Otherwise, no polyps, masses, inflammatory changes, or AVMs seen.  Normal retroflexed view of the rectum   COLONOSCOPY N/A 06/03/2014   SLF:  1. six colon polyps removed. 2.  Moderate sized internal hemorrhoids. 3. The left colon is redundant 4.  Moderate diverticulosis throughout the entire examed colon. next TCS in10 years with overtube.   COLONOSCOPY WITH PROPOFOL N/A 09/11/2021   Procedure: COLONOSCOPY WITH PROPOFOL;  Surgeon: Lanelle Bal, DO;  [facility]: [facility];  Service: Endoscopy;  Laterality: N/A;  10:45am   ESOPHAGOGASTRODUODENOSCOPY N/A 06/03/2014   SLF:  1. small hiatal hernia    ESOPHAGOGASTRODUODENOSCOPY N/A 02/16/2016   slf: gastritis, schazki ring   ESOPHAGOGASTRODUODENOSCOPY (EGD) WITH PROPOFOL N/A 09/11/2021   Procedure: ESOPHAGOGASTRODUODENOSCOPY (EGD) WITH PROPOFOL;  Surgeon: Lanelle Bal, DO;  [facility];  Service: Endoscopy;  Laterality: N/A;   GIVENS CAPSULE STUDY N/A 03/19/2016   Procedure: GIVENS CAPSULE STUDY;  Surgeon: West Bali, MD;  Location: [facility];  Service: Endoscopy;  Laterality: N/A;  0700   TOTAL HIP ARTHROPLASTY Left 04/30/2022   Procedure: LEFT TOTAL HIP ARTHROPLASTY ANTERIOR APPROACH;  Surgeon: Kathryne Hitch, MD;  Location: [facility];  Service: Orthopedics;  Laterality: Left;   VASCULAR SURGERY  1992   varicose veins    Current Medications: Current Meds  Medication Sig   acetaminophen (TYLENOL) 650 MG CR tablet Take 1,300 mg by mouth in the morning and at bedtime.   aspirin 81 MG chewable tablet Chew 1 tablet (81 mg total) by mouth 2 (two) times daily.   Calcium Citrate-Vitamin D (CALCIUM + D PO) Take 1 tablet by mouth every morning.   cholecalciferol (VITAMIN D3) 25 MCG (1000 UT) tablet Take 1,000 Units by mouth in the morning.   escitalopram (LEXAPRO) 10 MG tablet Take 10 mg by mouth at bedtime.   famciclovir (FAMVIR) 250 MG tablet Take 250 mg by mouth daily.   hydrochlorothiazide (HYDRODIURIL) 25 MG tablet Take 25 mg by mouth in the morning.   losartan (COZAAR) 100 MG tablet Take 100 mg by mouth in the morning.   Menthol, Topical Analgesic, (BIOFREEZE) 10 % CREA Apply 1 Application topically daily as needed (pain). Sometimes use roll-on   methocarbamol (ROBAXIN) 500 MG tablet Take 1 tablet (500 mg total) by mouth every 6 (six) hours as needed for muscle spasms.   montelukast (SINGULAIR) 10 MG tablet Take 10 mg by mouth at bedtime.   Multiple Vitamin (MULTIVITAMIN WITH MINERALS) TABS tablet Take 1 tablet by mouth in the morning.   omeprazole (PRILOSEC) 40 MG capsule Take 1 capsule by mouth once daily    ondansetron (ZOFRAN) 4 MG tablet Take 1 tablet (4 mg total) by mouth every 8 (eight) hours as needed for nausea or vomiting.   Polyethyl Glycol-Propyl Glycol (SYSTANE OP) Place 1 drop into both eyes 3 (three) times daily.   Potassium 99 MG TABS Take 99 mg by mouth in the morning.   rosuvastatin (CRESTOR) 40 MG tablet Take 1 tablet (40 mg total) by mouth daily.   traZODone (DESYREL) 50 MG tablet Take 50 mg by mouth at bedtime.   verapamil (CALAN-SR) 240 MG CR tablet Take 240 mg by mouth at bedtime.   [DISCONTINUED] hydrALAZINE (APRESOLINE) 50 MG tablet Take 50 mg by mouth 3 (three) times daily.   [DISCONTINUED] lovastatin (MEVACOR) 20 MG tablet Take 20 mg by mouth at bedtime.     Allergies:   Anesthetics, ester; Codeine; and Penicillins   Social History   Socioeconomic History   Marital status: Widowed    Spouse name: Not on file   Number of children: Not on file   Years of education: Not on file  Highest education level: Not on file  Occupational History   Not on file  Tobacco Use   Smoking status: Never   Smokeless tobacco: Never   Tobacco comments:    Never smoked  Vaping Use   Vaping Use: Never used  Substance and Sexual Activity   Alcohol use: No    Alcohol/week: 0.0 standard drinks of alcohol   Drug use: No   Sexual activity: Not Currently  Other Topics Concern   Not on file  Social History Narrative   Not on file   Social Determinants of Health   Financial Resource Strain: Not on file  Food Insecurity: Not on file  Transportation Needs: Not on file  Physical Activity: Not on file  Stress: Not on file  Social Connections: Not on file     Family History: The patient's family history is negative for Colon cancer and Liver disease.  ROS:   Please see the history of present illness.    + bilateral LE edema All other systems reviewed and are negative.  Labs/Other Studies Reviewed:    The following studies were reviewed today:  Lexiscan Myoview  03/01/22   Lexiscan stress is electrically nondiagnostic due to baseline changes   The study is low risk.   Left ventricular function is normal. End diastolic cavity size is normal.   Mild thinning inferoseptal base consistent with probable diaphragmatic attenuation and normal perfusion.   No significant ischemia or scar.   LVEF calculated at 74% with normal perfusion   Low risk study  Echo 03/01/22 1. Left ventricular ejection fraction, by estimation, is 60 to 65%. The  left ventricle has normal function. The left ventricle has no regional  wall motion abnormalities. Left ventricular diastolic parameters are  consistent with Grade I diastolic  dysfunction (impaired relaxation).   2. Right ventricular systolic function is normal. The right ventricular  size is normal. There is normal pulmonary artery systolic pressure.   3. The mitral valve is normal in structure. Trivial mitral valve  regurgitation. No evidence of mitral stenosis.   4. The aortic valve is tricuspid. Aortic valve regurgitation is not  visualized. No aortic stenosis is present.   5. The inferior vena cava is normal in size with greater than 50%  respiratory variability, suggesting right atrial pressure of 3 mmHg.     Recent Labs: 05/01/2022: BUN 16; Creatinine, Ser 1.01; Hemoglobin 9.7; Platelets 216; Potassium 3.5; Sodium 142  Recent Lipid Panel No results found for: "CHOL", "TRIG", "HDL", "CHOLHDL", "VLDL", "LDLCALC", "LDLDIRECT"   Risk Assessment/Calculations:           Physical Exam:    VS:  BP (!) 140/80   Pulse 69   Ht 5\' 11"  (1.803 m)   Wt 218 lb 6.4 oz (99.1 kg)   SpO2 95%   BMI 30.46 kg/m     Wt Readings from Last 3 Encounters:  03/16/23 218 lb 6.4 oz (99.1 kg)  11/08/22 221 lb 9.6 oz (100.5 kg)  08/09/22 216 lb 3.2 oz (98.1 kg)     GEN:  Well nourished, well developed in no acute distress HEENT: Normal NECK: No JVD; No carotid bruits CARDIAC: RRR, no murmurs, rubs, gallops RESPIRATORY:   Clear to auscultation without rales, wheezing or rhonchi  ABDOMEN: Soft, non-tender, non-distended MUSCULOSKELETAL:  No edema; No deformity. 2+ pedal pulses, equal bilaterally SKIN: Warm and dry NEUROLOGIC:  Alert and oriented x 3 PSYCHIATRIC:  Normal affect   EKG:  EKG is ordered today.  The ekg  ordered today demonstrates sinus rhythm with first-degree AV block with PR interval 274 ms, LAD, LBBB, no significant change from previous tracing  HYPERTENSION CONTROL Vitals:   03/16/23 1023 03/16/23 1303  BP: (!) 148/84 (!) 140/80    The patient's blood pressure is elevated above target today.  In order to address the patient's elevated BP: A current anti-hypertensive medication was adjusted today.       Diagnoses:    1. Essential hypertension   2. LBBB (left bundle branch block)   3. Hyperlipidemia LDL goal <70   4. Aortic atherosclerosis (HCC)   5. First degree AV block    Assessment and Plan:     LBBB: Known. Normal LVEF on echo 02/2022.  No symptoms of dizziness, presyncope, syncope.  Hypertension: BP is elevated and she admits that has not been well-controlled at home.  We will increase hydralazine to 50 mg 3 times daily.  She is not concerned about missing middle of the day dose. Encouraged low-sodium, heart healthy diet. Information on DASH diet given.   Hyperlipidemia/Aortic atherosclerosis: LDL 105, trigs 89  on 02/11/2023.  Lengthy discussion about elevated CV risk with LDL > 100. We will stop lovastatin and start rosuvastatin 40 mg daily. Recheck lipid/cmet in 2-3 months. She will get lab work at [facility] in [place]. Encouraged heart healthy, mostly plant based diet.   First degree AV block: EKG reveals sinus rhythm at 69 bpm with first-degree AV block with PR 274 ms. She is asymptomatic. Will continue to monitor on routine EKG.      Disposition: 6 months with me  Medication Adjustments/Labs and Tests Ordered: Current medicines are reviewed at length with the patient  today.  Concerns regarding medicines are outlined above.  Orders Placed This Encounter  Procedures   Lipid Profile   Comp Met (CMET)   EKG 12-Lead   Meds ordered this encounter  Medications   hydrALAZINE (APRESOLINE) 50 MG tablet    Sig: Take 1 tablet (50 mg total) by mouth 3 (three) times daily.    Dispense:  270 tablet    Refill:  3   rosuvastatin (CRESTOR) 40 MG tablet    Sig: Take 1 tablet (40 mg total) by mouth daily.    Dispense:  90 tablet    Refill:  3    Patient Instructions  Medication Instructions:   INCREASE Hydralazine one (1) tablet by mouth ( 50 mg) three times daily.   DISCONTINUE Lovastatin.  START Rosuvastatin one (1) tablet by mouth ( 40 mg) every evening.   *If you need a refill on your cardiac medications before your next appointment, please call your pharmacy*   Lab Work:  Your physician recommends that you return for a FASTING lipid profile/cmet. You can come in on the day of your appointment fasting from midnight the night before in [employer].    If you have labs (blood work) drawn today and your tests are completely normal, you will receive your results only by: MyChart Message (if you have MyChart) OR A paper copy in the mail If you have any lab test that is abnormal or we need to change your treatment, we will call you to review the results.   Testing/Procedures:  None ordered.   Follow-Up: At [facility], you and your health needs are our priority.  As part of our continuing mission to provide you with exceptional heart care, we have created designated Provider Care Teams.  These Care Teams include your primary Cardiologist (  physician) and Advanced Practice Providers (APPs -  Physician Assistants and Nurse Practitioners) who all work together to provide you with the care you need, when you need it.  We recommend signing up for the patient portal called "MyChart".  Sign up information is provided on this After Visit  Summary.  MyChart is used to connect with patients for Virtual Visits (Telemedicine).  Patients are able to view lab/test results, encounter notes, upcoming appointments, etc.  Non-urgent messages can be sent to your provider as well.   To learn more about what you can do with MyChart, go to [email or web address].    Your next appointment:   6 month(s)  Provider:   Eligha Bridegroom, NP         Other Instructions Mediterranean Diet A Mediterranean diet refers to food and lifestyle choices that are based on the traditions of countries located on the [place] Sea. It focuses on eating more fruits, vegetables, whole grains, beans, nuts, seeds, and heart-healthy fats, and eating less dairy, meat, eggs, and processed foods with added sugar, salt, and fat. This way of eating has been shown to help prevent certain conditions and improve outcomes for people who have chronic diseases, like kidney disease and heart disease. What are tips for following this plan? Reading food labels Check the serving size of packaged foods. For foods such as rice and pasta, the serving size refers to the amount of cooked product, not dry. Check the total fat in packaged foods. Avoid foods that have saturated fat or trans fats. Check the ingredient list for added sugars, such as corn syrup. Shopping  Buy a variety of foods that offer a balanced diet, including: Fresh fruits and vegetables (produce). Grains, beans, nuts, and seeds. Some of these may be available in unpackaged forms or large amounts (in bulk). Fresh seafood. Poultry and eggs. Low-fat dairy products. Buy whole ingredients instead of prepackaged foods. Buy fresh fruits and vegetables in-season from local farmers markets. Buy plain frozen fruits and vegetables. If you do not have access to quality fresh seafood, buy precooked frozen shrimp or canned fish, such as tuna, salmon, or sardines. Stock your pantry so you always have certain foods on hand,  such as olive oil, canned tuna, canned tomatoes, rice, pasta, and beans. Cooking Cook foods with extra-virgin olive oil instead of using butter or other vegetable oils. Have meat as a side dish, and have vegetables or grains as your main dish. This means having meat in small portions or adding small amounts of meat to foods like pasta or stew. Use beans or vegetables instead of meat in common dishes like chili or lasagna. Experiment with different cooking methods. Try roasting, broiling, steaming, and sauting vegetables. Add frozen vegetables to soups, stews, pasta, or rice. Add nuts or seeds for added healthy fats and plant protein at each meal. You can add these to yogurt, salads, or vegetable dishes. Marinate fish or vegetables using olive oil, lemon juice, garlic, and fresh herbs. Meal planning Plan to eat one vegetarian meal one day each week. Try to work up to two vegetarian meals, if possible. Eat seafood two or more times a week. Have healthy snacks readily available, such as: Vegetable sticks with hummus. Greek yogurt. Fruit and nut trail mix. Eat balanced meals throughout the week. This includes: Fruit: 2-3 servings a day. Vegetables: 4-5 servings a day. Low-fat dairy: 2 servings a day. Fish, poultry, or lean meat: 1 serving a day. Beans and legumes: 2 or more servings a week.  Nuts and seeds: 1-2 servings a day. Whole grains: 6-8 servings a day. Extra-virgin olive oil: 3-4 servings a day. Limit red meat and sweets to only a few servings a month. Lifestyle  Cook and eat meals together with your family, when possible. Drink enough fluid to keep your urine pale yellow. Be physically active every day. This includes: Aerobic exercise like running or swimming. Leisure activities like gardening, walking, or housework. Get 7-8 hours of sleep each night. If recommended by your health care provider, drink red wine in moderation. This means 1 glass a day for nonpregnant women and 2  glasses a day for men. A glass of wine equals 5 oz (150 mL). What foods should I eat? Fruits Apples. Apricots. Avocado. Berries. Bananas. Cherries. Dates. Figs. Grapes. Lemons. Melon. Oranges. Peaches. Plums. Pomegranate. Vegetables Artichokes. Beets. Broccoli. Cabbage. Carrots. Eggplant. Green beans. Chard. Kale. Spinach. Onions. Leeks. Peas. Squash. Tomatoes. Peppers. Radishes. Grains Whole-grain pasta. Brown rice. Bulgur wheat. Polenta. Couscous. Whole-wheat bread. Orpah Cobb. Meats and other proteins Beans. Almonds. Sunflower seeds. Pine nuts. Peanuts. Cod. Salmon. Scallops. Shrimp. Tuna. Tilapia. Clams. Oysters. Eggs. Poultry without skin. Dairy Low-fat milk. Cheese. Greek yogurt. Fats and oils Extra-virgin olive oil. Avocado oil. Grapeseed oil. Beverages Water. Red wine. Herbal tea. Sweets and desserts Greek yogurt with honey. Baked apples. Poached pears. Trail mix. Seasonings and condiments Basil. Cilantro. Coriander. Cumin. Mint. Parsley. Sage. Rosemary. Tarragon. Garlic. Oregano. Thyme. Pepper. Balsamic vinegar. Tahini. Hummus. Tomato sauce. Olives. Mushrooms. The items listed above may not be a complete list of foods and beverages you can eat. Contact a dietitian for more information. What foods should I limit? This is a list of foods that should be eaten rarely or only on special occasions. Fruits Fruit canned in syrup. Vegetables Deep-fried potatoes (french fries). Grains Prepackaged pasta or rice dishes. Prepackaged cereal with added sugar. Prepackaged snacks with added sugar. Meats and other proteins Beef. Pork. Lamb. Poultry with skin. Hot dogs. Tomasa Blase. Dairy Ice cream. Sour cream. Whole milk. Fats and oils Butter. Canola oil. Vegetable oil. Beef fat (tallow). Lard. Beverages Juice. Sugar-sweetened soft drinks. Beer. Liquor and spirits. Sweets and desserts Cookies. Cakes. Pies. Candy. Seasonings and condiments Mayonnaise. Pre-made sauces and marinades. The  items listed above may not be a complete list of foods and beverages you should limit. Contact a dietitian for more information. Summary The Mediterranean diet includes both food and lifestyle choices. Eat a variety of fresh fruits and vegetables, beans, nuts, seeds, and whole grains. Limit the amount of red meat and sweets that you eat. If recommended by your health care provider, drink red wine in moderation. This means 1 glass a day for nonpregnant women and 2 glasses a day for men. A glass of wine equals 5 oz (150 mL). This information is not intended to replace advice given to you by your health care provider. Make sure you discuss any questions you have with your health care provider. Document Revised: 11/02/2019 Document Reviewed: 08/30/2019 Elsevier Patient Education  2023 Elsevier Inc. DASH Eating Plan DASH stands for Dietary Approaches to Stop Hypertension. The DASH eating plan is a healthy eating plan that has been shown to: Lower high blood pressure (hypertension). Reduce your risk for type 2 diabetes, heart disease, and stroke. Help with weight loss. What are tips for following this plan? Reading food labels Check food labels for the amount of salt (sodium) per serving. Choose foods with less than 5 percent of the Daily Value (DV) of sodium. In general, foods  with less than 300 milligrams (mg) of sodium per serving fit into this eating plan. To find whole grains, look for the word "whole" as the first word in the ingredient list. Shopping Buy products labeled as "low-sodium" or "no salt added." Buy fresh foods. Avoid canned foods and pre-made or frozen meals. Cooking Try not to add salt when you cook. Use salt-free seasonings or herbs instead of table salt or sea salt. Check with your health care provider or pharmacist before using salt substitutes. Do not fry foods. Cook foods in healthy ways, such as baking, boiling, grilling, roasting, or broiling. Cook using oils that are  good for your heart. These include olive, canola, avocado, soybean, and sunflower oil. Meal planning  Eat a balanced diet. This should include: 4 or more servings of fruits and 4 or more servings of vegetables each day. Try to fill half of your plate with fruits and vegetables. 6-8 servings of whole grains each day. 6 or less servings of lean meat, poultry, or fish each day. 1 oz is 1 serving. A 3 oz (85 g) serving of meat is about the same size as the palm of your hand. One egg is 1 oz (28 g). 2-3 servings of low-fat dairy each day. One serving is 1 cup (237 mL). 1 serving of nuts, seeds, or beans 5 times each week. 2-3 servings of heart-healthy fats. Healthy fats called omega-3 fatty acids are found in foods such as walnuts, flaxseeds, fortified milks, and eggs. These fats are also found in cold-water fish, such as sardines, salmon, and mackerel. Limit how much you eat of: Canned or prepackaged foods. Food that is high in trans fat, such as fried foods. Food that is high in saturated fat, such as fatty meat. Desserts and other sweets, sugary drinks, and other foods with added sugar. Full-fat dairy products. Do not salt foods before eating. Do not eat more than 4 egg yolks a week. Try to eat at least 2 vegetarian meals a week. Eat more home-cooked food and less restaurant, buffet, and fast food. Lifestyle When eating at a restaurant, ask if your food can be made with less salt or no salt. If you drink alcohol: Limit how much you have to: 0-1 drink a day if you are female. 0-2 drinks a day if you are female. Know how much alcohol is in your drink. In the [place] one drink is one 12 oz bottle of beer (355 mL), one 5 oz glass of wine (148 mL), or one 1 oz glass of hard liquor (44 mL). General information Avoid eating more than 2,300 mg of salt a day. If you have hypertension, you may need to reduce your sodium intake to 1,500 mg a day. Work with your provider to stay at a healthy body weight  or lose weight. Ask what the best weight range is for you. On most days of the week, get at least 30 minutes of exercise that causes your heart to beat faster. This may include walking, swimming, or biking. Work with your provider or dietitian to adjust your eating plan to meet your specific calorie needs. What foods should I eat? Fruits All fresh, dried, or frozen fruit. Canned fruits that are in their natural juice and do not have sugar added to them. Vegetables Fresh or frozen vegetables that are raw, steamed, roasted, or grilled. Low-sodium or reduced-sodium tomato and vegetable juice. Low-sodium or reduced-sodium tomato sauce and tomato paste. Low-sodium or reduced-sodium canned vegetables. Grains Whole-grain or whole-wheat  bread. Whole-grain or whole-wheat pasta. Brown rice. Orpah Cobb. Bulgur. Whole-grain and low-sodium cereals. Pita bread. Low-fat, low-sodium crackers. Whole-wheat flour tortillas. Meats and other proteins Skinless chicken or [place]. Ground chicken or [place]. Pork with fat trimmed off. Fish and seafood. Egg whites. Dried beans, peas, or lentils. Unsalted nuts, nut butters, and seeds. Unsalted canned beans. Lean cuts of beef with fat trimmed off. Low-sodium, lean precooked or cured meat, such as sausages or meat loaves. Dairy Low-fat (1%) or fat-free (skim) milk. Reduced-fat, low-fat, or fat-free cheeses. Nonfat, low-sodium ricotta or cottage cheese. Low-fat or nonfat yogurt. Low-fat, low-sodium cheese. Fats and oils Soft margarine without trans fats. Vegetable oil. Reduced-fat, low-fat, or light mayonnaise and salad dressings (reduced-sodium). Canola, safflower, olive, avocado, soybean, and sunflower oils. Avocado. Seasonings and condiments Herbs. Spices. Seasoning mixes without salt. Other foods Unsalted popcorn and pretzels. Fat-free sweets. The items listed above may not be all the foods and drinks you can have. Talk to a dietitian to learn more. What foods should  I avoid? Fruits Canned fruit in a light or heavy syrup. Fried fruit. Fruit in cream or butter sauce. Vegetables Creamed or fried vegetables. Vegetables in a cheese sauce. Regular canned vegetables that are not marked as low-sodium or reduced-sodium. Regular canned tomato sauce and paste that are not marked as low-sodium or reduced-sodium. Regular tomato and vegetable juices that are not marked as low-sodium or reduced-sodium. Rosita Fire. Olives. Grains Baked goods made with fat, such as croissants, muffins, or some breads. Dry pasta or rice meal packs. Meats and other proteins Fatty cuts of meat. Ribs. Fried meat. Tomasa Blase. Bologna, salami, and other precooked or cured meats, such as sausages or meat loaves, that are not lean and low in sodium. Fat from the back of a pig (fatback). Bratwurst. Salted nuts and seeds. Canned beans with added salt. Canned or smoked fish. Whole eggs or egg yolks. Chicken or [place] with skin. Dairy Whole or 2% milk, cream, and half-and-half. Whole or full-fat cream cheese. Whole-fat or sweetened yogurt. Full-fat cheese. Nondairy creamers. Whipped toppings. Processed cheese and cheese spreads. Fats and oils Butter. Stick margarine. Lard. Shortening. Ghee. Bacon fat. Tropical oils, such as coconut, palm kernel, or palm oil. Seasonings and condiments Onion salt, garlic salt, seasoned salt, table salt, and sea salt. Worcestershire sauce. Tartar sauce. Barbecue sauce. Teriyaki sauce. Soy sauce, including reduced-sodium soy sauce. Steak sauce. Canned and packaged gravies. Fish sauce. Oyster sauce. Cocktail sauce. Store-bought horseradish. Ketchup. Mustard. Meat flavorings and tenderizers. Bouillon cubes. Hot sauces. Pre-made or packaged marinades. Pre-made or packaged taco seasonings. Relishes. Regular salad dressings. Other foods Salted popcorn and pretzels. The items listed above may not be all the foods and drinks you should avoid. Talk to a dietitian to learn more. Where to find  more information National Heart, Lung, and Blood Institute (NHLBI): BuffaloDryCleaner.gl American Heart Association (AHA): heart.org Academy of Nutrition and Dietetics: eatright.org National Kidney Foundation (NKF): kidney.org This information is not intended to replace advice given to you by your health care provider. Make sure you discuss any questions you have with your health care provider. Document Revised: 10/14/2022 Document Reviewed: 10/14/2022 Elsevier Patient Education  2024 Else[employer] Inc. Adopting a Healthy Lifestyle.   Weight: Know what a healthy weight is for you (roughly BMI <25) and aim to maintain this. You can calculate your body mass index on your smart phone  Diet: Aim for 7+ servings of fruits and vegetables daily Limit animal fats in diet for cholesterol and heart health - choose grass  fed whenever available Avoid highly processed foods (fast food burgers, tacos, fried chicken, pizza, hot dogs, french fries)  Saturated fat comes in the form of butter, lard, coconut oil, margarine, partially hydrogenated oils, and fat in meat. These increase your risk of cardiovascular disease.  Use healthy plant oils, such as olive, canola, soy, corn, sunflower and peanut.  Whole foods such as fruits, vegetables and whole grains have fiber  Men need > 38 grams of fiber per day Women need > 25 grams of fiber per day  Load up on vegetables and fruits - one-half of your plate: Aim for color and variety, and remember that potatoes dont count. Go for whole grains - one-quarter of your plate: Whole wheat, barley, wheat berries, quinoa, oats, brown rice, and foods made with them. If you want pasta, go with whole wheat pasta. Protein power - one-quarter of your plate: Fish, chicken, beans, and nuts are all healthy, versatile protein sources. Limit red meat. You need carbohydrates for energy! The type of carbohydrate is more important than the amount. Choose carbohydrates such as vegetables, fruits,  whole grains, beans, and nuts in the place of white rice, white pasta, potatoes (baked or fried), macaroni and cheese, cakes, cookies, and donuts.  If youre thirsty, drink water. Coffee and tea are good in moderation, but skip sugary drinks and limit milk and dairy products to one or two daily servings. Keep sugar intake at 6 teaspoons or 24 grams or LESS       Exercise: Aim for 150 min of moderate intensity exercise weekly for heart health, and weights twice weekly for bone health Stay active - any steps are better than no steps! Aim for 7-9 hours of sleep daily      HOW TO TAKE YOUR BLOOD PRESSURE  Rest 5 minutes before taking your blood pressure. Don't  smoke or drink caffeinated beverages for at least 30 minutes before. Take your blood pressure before (not after) you eat. Sit comfortably with your back supported and both feet on the floor ( don't cross your legs). Elevate your arm to heart level on a table or a desk. Use the proper sized cuff.  It should fit smoothly and snugly around your bare upper arm.  There should be  Enough room to slip a fingertip under the cuff.  The bottom edge of the cuff should be 1 inch above the crease Of the elbow. Please monitor your blood pressure once daily 2 hours after your am medication. If you blood pressure Consistently remains above 140 (systolic) top number or over 90 ( diastolic) bottom number X 3 days  Consecutively.  Please call our office at [phone] or send Mychart message.     ----Avoid cold medicines with D or DM at the end of them----  I will call you or send mychart message with a follow up appointment with Eligha Bridegroom, NP in December.       Signed, Levi Aland, NP  03/16/2023 1:06 PM    [facility]

## 2023-03-16 NOTE — Patient Instructions (Signed)
Medication Instructions:   INCREASE Hydralazine one (1) tablet by mouth ( 50 mg) three times daily.   DISCONTINUE Lovastatin.  START Rosuvastatin one (1) tablet by mouth ( 40 mg) every evening.   *If you need a refill on your cardiac medications before your next appointment, please call your pharmacy*   Lab Work:  Your physician recommends that you return for a FASTING lipid profile/cmet. You can come in on the day of your appointment fasting from midnight the night before in [employer].    If you have labs (blood work) drawn today and your tests are completely normal, you will receive your results only by: MyChart Message (if you have MyChart) OR A paper copy in the mail If you have any lab test that is abnormal or we need to change your treatment, we will call you to review the results.   Testing/Procedures:  None ordered.   Follow-Up: At [facility], you and your health needs are our priority.  As part of our continuing mission to provide you with exceptional heart care, we have created designated Provider Care Teams.  These Care Teams include your primary Cardiologist (physician) and Advanced Practice Providers (APPs -  Physician Assistants and Nurse Practitioners) who all work together to provide you with the care you need, when you need it.  We recommend signing up for the patient portal called "MyChart".  Sign up information is provided on this After Visit Summary.  MyChart is used to connect with patients for Virtual Visits (Telemedicine).  Patients are able to view lab/test results, encounter notes, upcoming appointments, etc.  Non-urgent messages can be sent to your provider as well.   To learn more about what you can do with MyChart, go to [email or web address].    Your next appointment:   6 month(s)  Provider:   Eligha Bridegroom, NP         Other Instructions Mediterranean Diet A Mediterranean diet refers to food and lifestyle choices that  are based on the traditions of countries located on the [place]. It focuses on eating more fruits, vegetables, whole grains, beans, nuts, seeds, and heart-healthy fats, and eating less dairy, meat, eggs, and processed foods with added sugar, salt, and fat. This way of eating has been shown to help prevent certain conditions and improve outcomes for people who have chronic diseases, like kidney disease and heart disease. What are tips for following this plan? Reading food labels Check the serving size of packaged foods. For foods such as rice and pasta, the serving size refers to the amount of cooked product, not dry. Check the total fat in packaged foods. Avoid foods that have saturated fat or trans fats. Check the ingredient list for added sugars, such as corn syrup. Shopping  Buy a variety of foods that offer a balanced diet, including: Fresh fruits and vegetables (produce). Grains, beans, nuts, and seeds. Some of these may be available in unpackaged forms or large amounts (in bulk). Fresh seafood. Poultry and eggs. Low-fat dairy products. Buy whole ingredients instead of prepackaged foods. Buy fresh fruits and vegetables in-season from local farmers markets. Buy plain frozen fruits and vegetables. If you do not have access to quality fresh seafood, buy precooked frozen shrimp or canned fish, such as tuna, salmon, or sardines. Stock your pantry so you always have certain foods on hand, such as olive oil, canned tuna, canned tomatoes, rice, pasta, and beans. Cooking Cook foods with extra-virgin olive oil instead of using butter or  other vegetable oils. Have meat as a side dish, and have vegetables or grains as your main dish. This means having meat in small portions or adding small amounts of meat to foods like pasta or stew. Use beans or vegetables instead of meat in common dishes like chili or lasagna. Experiment with different cooking methods. Try roasting, broiling, steaming,  and sauting vegetables. Add frozen vegetables to soups, stews, pasta, or rice. Add nuts or seeds for added healthy fats and plant protein at each meal. You can add these to yogurt, salads, or vegetable dishes. Marinate fish or vegetables using olive oil, lemon juice, garlic, and fresh herbs. Meal planning Plan to eat one vegetarian meal one day each week. Try to work up to two vegetarian meals, if possible. Eat seafood two or more times a week. Have healthy snacks readily available, such as: Vegetable sticks with hummus. Greek yogurt. Fruit and nut trail mix. Eat balanced meals throughout the week. This includes: Fruit: 2-3 servings a day. Vegetables: 4-5 servings a day. Low-fat dairy: 2 servings a day. Fish, poultry, or lean meat: 1 serving a day. Beans and legumes: 2 or more servings a week. Nuts and seeds: 1-2 servings a day. Whole grains: 6-8 servings a day. Extra-virgin olive oil: 3-4 servings a day. Limit red meat and sweets to only a few servings a month. Lifestyle  Cook and eat meals together with your family, when possible. Drink enough fluid to keep your urine pale yellow. Be physically active every day. This includes: Aerobic exercise like running or swimming. Leisure activities like gardening, walking, or housework. Get 7-8 hours of sleep each night. If recommended by your health care provider, drink red wine in moderation. This means 1 glass a day for nonpregnant women and 2 glasses a day for men. A glass of wine equals 5 oz (150 mL). What foods should I eat? Fruits Apples. Apricots. Avocado. Berries. Bananas. Cherries. Dates. Figs. Grapes. Lemons. Melon. Oranges. Peaches. Plums. Pomegranate. Vegetables Artichokes. Beets. Broccoli. Cabbage. Carrots. Eggplant. Green beans. Chard. Kale. Spinach. Onions. Leeks. Peas. Squash. Tomatoes. Peppers. Radishes. Grains Whole-grain pasta. Brown rice. Bulgur wheat. Polenta. Couscous. Whole-wheat bread. Orpah Cobb. Meats and  other proteins Beans. Almonds. Sunflower seeds. Pine nuts. Peanuts. Cod. Salmon. Scallops. Shrimp. Tuna. Tilapia. Clams. Oysters. Eggs. Poultry without skin. Dairy Low-fat milk. Cheese. Greek yogurt. Fats and oils Extra-virgin olive oil. Avocado oil. Grapeseed oil. Beverages Water. Red wine. Herbal tea. Sweets and desserts Greek yogurt with honey. Baked apples. Poached pears. Trail mix. Seasonings and condiments Basil. Cilantro. Coriander. Cumin. Mint. Parsley. Sage. Rosemary. Tarragon. Garlic. Oregano. Thyme. Pepper. Balsamic vinegar. Tahini. Hummus. Tomato sauce. Olives. Mushrooms. The items listed above may not be a complete list of foods and beverages you can eat. Contact a dietitian for more information. What foods should I limit? This is a list of foods that should be eaten rarely or only on special occasions. Fruits Fruit canned in syrup. Vegetables Deep-fried potatoes (french fries). Grains Prepackaged pasta or rice dishes. Prepackaged cereal with added sugar. Prepackaged snacks with added sugar. Meats and other proteins Beef. Pork. Lamb. Poultry with skin. Hot dogs. Tomasa Blase. Dairy Ice cream. Sour cream. Whole milk. Fats and oils Butter. Canola oil. Vegetable oil. Beef fat (tallow). Lard. Beverages Juice. Sugar-sweetened soft drinks. Beer. Liquor and spirits. Sweets and desserts Cookies. Cakes. Pies. Candy. Seasonings and condiments Mayonnaise. Pre-made sauces and marinades. The items listed above may not be a complete list of foods and beverages you should limit. Contact a dietitian for  more information. Summary The Mediterranean diet includes both food and lifestyle choices. Eat a variety of fresh fruits and vegetables, beans, nuts, seeds, and whole grains. Limit the amount of red meat and sweets that you eat. If recommended by your health care provider, drink red wine in moderation. This means 1 glass a day for nonpregnant women and 2 glasses a day for men. A glass of  wine equals 5 oz (150 mL). This information is not intended to replace advice given to you by your health care provider. Make sure you discuss any questions you have with your health care provider. Document Revised: 11/02/2019 Document Reviewed: 08/30/2019 Elsevier Patient Education  2023 Elsevier Inc. DASH Eating Plan DASH stands for Dietary Approaches to Stop Hypertension. The DASH eating plan is a healthy eating plan that has been shown to: Lower high blood pressure (hypertension). Reduce your risk for type 2 diabetes, heart disease, and stroke. Help with weight loss. What are tips for following this plan? Reading food labels Check food labels for the amount of salt (sodium) per serving. Choose foods with less than 5 percent of the Daily Value (DV) of sodium. In general, foods with less than 300 milligrams (mg) of sodium per serving fit into this eating plan. To find whole grains, look for the word "whole" as the first word in the ingredient list. Shopping Buy products labeled as "low-sodium" or "no salt added." Buy fresh foods. Avoid canned foods and pre-made or frozen meals. Cooking Try not to add salt when you cook. Use salt-free seasonings or herbs instead of table salt or sea salt. Check with your health care provider or pharmacist before using salt substitutes. Do not fry foods. Cook foods in healthy ways, such as baking, boiling, grilling, roasting, or broiling. Cook using oils that are good for your heart. These include olive, canola, avocado, soybean, and sunflower oil. Meal planning  Eat a balanced diet. This should include: 4 or more servings of fruits and 4 or more servings of vegetables each day. Try to fill half of your plate with fruits and vegetables. 6-8 servings of whole grains each day. 6 or less servings of lean meat, poultry, or fish each day. 1 oz is 1 serving. A 3 oz (85 g) serving of meat is about the same size as the palm of your hand. One egg is 1 oz (28  g). 2-3 servings of low-fat dairy each day. One serving is 1 cup (237 mL). 1 serving of nuts, seeds, or beans 5 times each week. 2-3 servings of heart-healthy fats. Healthy fats called omega-3 fatty acids are found in foods such as walnuts, flaxseeds, fortified milks, and eggs. These fats are also found in cold-water fish, such as sardines, salmon, and mackerel. Limit how much you eat of: Canned or prepackaged foods. Food that is high in trans fat, such as fried foods. Food that is high in saturated fat, such as fatty meat. Desserts and other sweets, sugary drinks, and other foods with added sugar. Full-fat dairy products. Do not salt foods before eating. Do not eat more than 4 egg yolks a week. Try to eat at least 2 vegetarian meals a week. Eat more home-cooked food and less restaurant, buffet, and fast food. Lifestyle When eating at a restaurant, ask if your food can be made with less salt or no salt. If you drink alcohol: Limit how much you have to: 0-1 drink a day if you are female. 0-2 drinks a day if you are female. Know  how much alcohol is in your drink. In the [place] one drink is one 12 oz bottle of beer (355 mL), one 5 oz glass of wine (148 mL), or one 1 oz glass of hard liquor (44 mL). General information Avoid eating more than 2,300 mg of salt a day. If you have hypertension, you may need to reduce your sodium intake to 1,500 mg a day. Work with your provider to stay at a healthy body weight or lose weight. Ask what the best weight range is for you. On most days of the week, get at least 30 minutes of exercise that causes your heart to beat faster. This may include walking, swimming, or biking. Work with your provider or dietitian to adjust your eating plan to meet your specific calorie needs. What foods should I eat? Fruits All fresh, dried, or frozen fruit. Canned fruits that are in their natural juice and do not have sugar added to them. Vegetables Fresh or frozen vegetables  that are raw, steamed, roasted, or grilled. Low-sodium or reduced-sodium tomato and vegetable juice. Low-sodium or reduced-sodium tomato sauce and tomato paste. Low-sodium or reduced-sodium canned vegetables. Grains Whole-grain or whole-wheat bread. Whole-grain or whole-wheat pasta. Brown rice. Orpah Cobb. Bulgur. Whole-grain and low-sodium cereals. Pita bread. Low-fat, low-sodium crackers. Whole-wheat flour tortillas. Meats and other proteins Skinless chicken or [place]. Ground chicken or [place]. Pork with fat trimmed off. Fish and seafood. Egg whites. Dried beans, peas, or lentils. Unsalted nuts, nut butters, and seeds. Unsalted canned beans. Lean cuts of beef with fat trimmed off. Low-sodium, lean precooked or cured meat, such as sausages or meat loaves. Dairy Low-fat (1%) or fat-free (skim) milk. Reduced-fat, low-fat, or fat-free cheeses. Nonfat, low-sodium ricotta or cottage cheese. Low-fat or nonfat yogurt. Low-fat, low-sodium cheese. Fats and oils Soft margarine without trans fats. Vegetable oil. Reduced-fat, low-fat, or light mayonnaise and salad dressings (reduced-sodium). Canola, safflower, olive, avocado, soybean, and sunflower oils. Avocado. Seasonings and condiments Herbs. Spices. Seasoning mixes without salt. Other foods Unsalted popcorn and pretzels. Fat-free sweets. The items listed above may not be all the foods and drinks you can have. Talk to a dietitian to learn more. What foods should I avoid? Fruits Canned fruit in a light or heavy syrup. Fried fruit. Fruit in cream or butter sauce. Vegetables Creamed or fried vegetables. Vegetables in a cheese sauce. Regular canned vegetables that are not marked as low-sodium or reduced-sodium. Regular canned tomato sauce and paste that are not marked as low-sodium or reduced-sodium. Regular tomato and vegetable juices that are not marked as low-sodium or reduced-sodium. Rosita Fire. Olives. Grains Baked goods made with fat, such as  croissants, muffins, or some breads. Dry pasta or rice meal packs. Meats and other proteins Fatty cuts of meat. Ribs. Fried meat. Tomasa Blase. Bologna, salami, and other precooked or cured meats, such as sausages or meat loaves, that are not lean and low in sodium. Fat from the back of a pig (fatback). Bratwurst. Salted nuts and seeds. Canned beans with added salt. Canned or smoked fish. Whole eggs or egg yolks. Chicken or [place] with skin. Dairy Whole or 2% milk, cream, and half-and-half. Whole or full-fat cream cheese. Whole-fat or sweetened yogurt. Full-fat cheese. Nondairy creamers. Whipped toppings. Processed cheese and cheese spreads. Fats and oils Butter. Stick margarine. Lard. Shortening. Ghee. Bacon fat. Tropical oils, such as coconut, palm kernel, or palm oil. Seasonings and condiments Onion salt, garlic salt, seasoned salt, table salt, and sea salt. Worcestershire sauce. Tartar sauce. Barbecue sauce. Teriyaki sauce. Soy  sauce, including reduced-sodium soy sauce. Steak sauce. Canned and packaged gravies. Fish sauce. Oyster sauce. Cocktail sauce. Store-bought horseradish. Ketchup. Mustard. Meat flavorings and tenderizers. Bouillon cubes. Hot sauces. Pre-made or packaged marinades. Pre-made or packaged taco seasonings. Relishes. Regular salad dressings. Other foods Salted popcorn and pretzels. The items listed above may not be all the foods and drinks you should avoid. Talk to a dietitian to learn more. Where to find more information National Heart, Lung, and Blood Institute (NHLBI): BuffaloDryCleaner.gl American Heart Association (AHA): heart.org Academy of Nutrition and Dietetics: eatright.org National Kidney Foundation (NKF): kidney.org This information is not intended to replace advice given to you by your health care provider. Make sure you discuss any questions you have with your health care provider. Document Revised: 10/14/2022 Document Reviewed: 10/14/2022 Elsevier Patient Education  2024  Elsevier Inc. Adopting a Healthy Lifestyle.   Weight: Know what a healthy weight is for you (roughly BMI <25) and aim to maintain this. You can calculate your body mass index on your smart phone  Diet: Aim for 7+ servings of fruits and vegetables daily Limit animal fats in diet for cholesterol and heart health - choose grass fed whenever available Avoid highly processed foods (fast food burgers, tacos, fried chicken, pizza, hot dogs, french fries)  Saturated fat comes in the form of butter, lard, coconut oil, margarine, partially hydrogenated oils, and fat in meat. These increase your risk of cardiovascular disease.  Use healthy plant oils, such as olive, canola, soy, corn, sunflower and peanut.  Whole foods such as fruits, vegetables and whole grains have fiber  Men need > 38 grams of fiber per day Women need > 25 grams of fiber per day  Load up on vegetables and fruits - one-half of your plate: Aim for color and variety, and remember that potatoes dont count. Go for whole grains - one-quarter of your plate: Whole wheat, barley, wheat berries, quinoa, oats, brown rice, and foods made with them. If you want pasta, go with whole wheat pasta. Protein power - one-quarter of your plate: Fish, chicken, beans, and nuts are all healthy, versatile protein sources. Limit red meat. You need carbohydrates for energy! The type of carbohydrate is more important than the amount. Choose carbohydrates such as vegetables, fruits, whole grains, beans, and nuts in the place of white rice, white pasta, potatoes (baked or fried), macaroni and cheese, cakes, cookies, and donuts.  If youre thirsty, drink water. Coffee and tea are good in moderation, but skip sugary drinks and limit milk and dairy products to one or two daily servings. Keep sugar intake at 6 teaspoons or 24 grams or LESS       Exercise: Aim for 150 min of moderate intensity exercise weekly for heart health, and weights twice weekly for bone  health Stay active - any steps are better than no steps! Aim for 7-9 hours of sleep daily      HOW TO TAKE YOUR BLOOD PRESSURE  Rest 5 minutes before taking your blood pressure. Don't  smoke or drink caffeinated beverages for at least 30 minutes before. Take your blood pressure before (not after) you eat. Sit comfortably with your back supported and both feet on the floor ( don't cross your legs). Elevate your arm to heart level on a table or a desk. Use the proper sized cuff.  It should fit smoothly and snugly around your bare upper arm.  There should be  Enough room to slip a fingertip under the cuff.  The bottom  edge of the cuff should be 1 inch above the crease Of the elbow. Please monitor your blood pressure once daily 2 hours after your am medication. If you blood pressure Consistently remains above 140 (systolic) top number or over 90 ( diastolic) bottom number X 3 days  Consecutively.  Please call our office at [phone] or send Mychart message.     ----Avoid cold medicines with D or DM at the end of them----  I will call you or send mychart message with a follow up appointment with Eligha Bridegroom, NP in December.

## 2023-03-17 ENCOUNTER — Other Ambulatory Visit (INDEPENDENT_AMBULATORY_CARE_PROVIDER_SITE_OTHER): Payer: PPO

## 2023-03-17 ENCOUNTER — Ambulatory Visit (INDEPENDENT_AMBULATORY_CARE_PROVIDER_SITE_OTHER): Payer: PPO | Admitting: Orthopaedic Surgery

## 2023-03-17 ENCOUNTER — Encounter: Payer: Self-pay | Admitting: Orthopaedic Surgery

## 2023-03-17 DIAGNOSIS — Z96642 Presence of left artificial hip joint: Secondary | ICD-10-CM | POA: Diagnosis not present

## 2023-03-17 NOTE — Progress Notes (Signed)
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  She has good range of motion and strength and only some discomfort from time to time.  She is an active 72 year old female.  She denies any issues with her right hip.  Her left operative hip moves smoothly and fluidly.  There is a little bit of numbness around the incision but this is only mild.  Her right hip also move smoothly.  An AP pelvis shows a well-seated left total hip arthroplasty with no complicating features.  From my standpoint we will see her back for 1 more visit in 6 months from now.  If there are issues before then she knows to let us know.  At that last visit we will get a another standing AP pelvis or lateral of her left operative hip.

## 2023-03-18 ENCOUNTER — Encounter: Payer: Self-pay | Admitting: Gastroenterology

## 2023-03-29 ENCOUNTER — Other Ambulatory Visit: Payer: Self-pay | Admitting: Podiatry

## 2023-04-01 ENCOUNTER — Ambulatory Visit (HOSPITAL_COMMUNITY)
Admission: RE | Admit: 2023-04-01 | Discharge: 2023-04-01 | Disposition: A | Discharge status: Home / Self Care | Payer: PPO | Source: Ambulatory Visit | Attending: Internal Medicine | Admitting: Internal Medicine

## 2023-04-01 ENCOUNTER — Encounter (HOSPITAL_COMMUNITY): Payer: Self-pay

## 2023-04-01 DIAGNOSIS — Z1231 Encounter for screening mammogram for malignant neoplasm of breast: Secondary | ICD-10-CM | POA: Diagnosis not present

## 2023-04-11 DIAGNOSIS — I10 Essential (primary) hypertension: Secondary | ICD-10-CM | POA: Diagnosis not present

## 2023-04-11 DIAGNOSIS — M79674 Pain in right toe(s): Secondary | ICD-10-CM | POA: Diagnosis not present

## 2023-04-11 DIAGNOSIS — Z01818 Encounter for other preprocedural examination: Secondary | ICD-10-CM | POA: Diagnosis not present

## 2023-04-11 DIAGNOSIS — M205X1 Other deformities of toe(s) (acquired), right foot: Secondary | ICD-10-CM | POA: Diagnosis not present

## 2023-04-15 NOTE — Patient Instructions (Addendum)
Jamie Hunt  04/15/2023     @PREFPERIOPPHARMACY @   Your procedure is scheduled on  04/20/2023.   Report to [facility] at  0600  A.M.   Call this number if you have problems the morning of surgery:  [phone]  If you experience any cold or flu symptoms such as cough, fever, chills, shortness of breath, etc. between now and your scheduled surgery, please notify us at the above number.   Remember:  Do not eat or drink after midnight.      Take these medicines the morning of surgery with A SIP OF WATER                                           omeprazole.     Do not wear jewelry, make-up or nail polish, including gel polish,  artificial nails, or any other type of covering on natural nails (fingers and  toes).   Do not wear lotions, powders, or perfumes, or deodorant.   Do not shave 48 hours prior to surgery.  Men may shave   face and neck.   Do not bring valuables to the hospital.    [facility] is not responsible for any belongings or valuables.  Contacts, dentures or bridgework may not be worn into surgery.  Leave your suitcase in the car.  After surgery it may be brought to your room.  For patients admitted to the hospital, discharge time will be determined by your treatment team.  Patients discharged the day of surgery will not be allowed to drive home and must have someone with them for 24 hours.    Special instructions:   DO NOT smoke tobacco or vape for 24 hours before your procedure.  Please read over the following fact sheets that you were given. Coughing and Deep Breathing, Surgical Site Infection Prevention, Anesthesia Post-op Instructions, and Care and Recovery After Surgery         Incision Care, Adult An incision is a cut that a doctor makes in your skin for surgery. Most times, these cuts are closed after surgery. Your cut from surgery may be closed with: Stitches (sutures). Staples. Skin glue. Skin tape (adhesive) strips. You  may need to go back to your doctor to have stitches or staples taken out. This may happen many days or many weeks after your surgery. You need to take good care of your cut so it does not get infected. Follow instructions from your doctor about how to care for your cut. Supplies needed: Soap and water. A clean hand towel. Wound cleanser. A clean bandage (dressing), if needed. Cream or ointment, if told by your doctor. Clean gauze. How to care for your cut from surgery Cleaning your cut Ask your doctor how to clean your cut. You may need to: Wear medical gloves. Use mild soap and water, or a wound cleanser. Use a clean gauze to pat your cut dry after you clean it. Changing your bandage Wash your hands with soap and water for at least 20 seconds before and after you change your bandage. If you cannot use soap and water, use hand sanitizer. Do not usedisinfectants or antiseptics, such as rubbing alcohol, to clean your wound unless told by your doctor. Change your bandage as told by your doctor. Leavestitches or skin glue in place for at least 2 weeks. Leave  tape strips alone unless you are told to take them off. You may trim the edges of the tape strips if they curl up. Put a cream or ointment on your cut. Do this only as told. Cover your cut with a clean bandage. Ask your doctor when you can leave your cut uncovered. Checking for infection Check your cut area every day for signs of infection. Check for: More redness, swelling, or pain. More fluid or blood. New warmth. Hardness or a new rash around the incision. Pus or a bad smell.  Follow these instructions at home Medicines Take over-the-counter and prescription medicines only as told by your doctor. If you were prescribed an antibiotic medicine, cream, or ointment, use it as told by your doctor. Do not stop using the antibiotic even if you start to feel better. Eating and drinking Eat foods that have a lot of certain nutrients,  such as protein, vitamin A, and vitamin C. These foods help your cut heal. Foods rich in protein include meat, fish, eggs, dairy, beans, nuts, and protein drinks. Foods rich in vitamin A include carrots and dark green, leafy vegetables. Foods rich in vitamin C include citrus fruits, tomatoes, broccoli, and peppers. Drink enough fluid to keep your pee (urine) pale yellow. General instructions  Do not take baths, swim, or use a hot tub. Ask your doctor about taking showers or sponge baths. Limit movement around your cut. This helps with healing. Try not to strain, lift, or exercise for the first 2 weeks, or for as long as told by your doctor. Return to your normal activities as told by your doctor. Ask your doctor what activities are safe for you. Do not scratch, scrub, or pick at your cut. Keep it covered as told by your doctor. Protect your cut from the sun when you are outside for the first 6 months, or for as long as told by your doctor. Cover up the scar area or put on sunscreen that has an SPF of at least 30. Do not use any products that contain nicotine or tobacco, such as cigarettes, e-cigarettes, and chewing tobacco. These can delay cut healing. If you need help quitting, ask your doctor. Keep all follow-up visits. Contact a doctor if: You have any of these signs of infection around your cut: More redness, swelling, or pain. More fluid or blood. New warmth or hardness. Pus or a bad smell. A new rash. You have a fever. You feel like you may vomit (nauseous). You vomit. You are dizzy. Your stitches, staples, skin glue, or tape strips come undone. Your cut gets bigger. You have a fever. Get help right away if: Your cut bleeds through your bandage, and bleeding does not stop with gentle pressure. Your cut opens up and comes apart. These symptoms may be an emergency. Do not wait to see if the symptoms will go away. Get medical help right away. Call your local emergency services (911  in the [place] Do not drive yourself to the hospital. Summary Follow instructions from your doctor about how to care for your cut. Wash your hands with soap and water for at least 20 seconds before and after you change your bandage. If you cannot use soap and water, use hand sanitizer. Check your cut area every day for signs of infection. Keep all follow-up visits. This information is not intended to replace advice given to you by your health care provider. Make sure you discuss any questions you have with your health care provider. Document  Revised: 12/29/2020 Document Reviewed: 12/29/2020 Elsevier Patient Education  2024 [employer] How to Use Chlorhexidine Before Surgery Chlorhexidine gluconate (CHG) is a germ-killing (antiseptic) solution that is used to clean the skin. It can get rid of the bacteria that normally live on the skin and can keep them away for about 24 hours. To clean your skin with CHG, you may be given: A CHG solution to use in the shower or as part of a sponge bath. A prepackaged cloth that contains CHG. Cleaning your skin with CHG may help lower the risk for infection: While you are staying in the intensive care unit of the hospital. If you have a vascular access, such as a central line, to provide short-term or long-term access to your veins. If you have a catheter to drain urine from your bladder. If you are on a ventilator. A ventilator is a machine that helps you breathe by moving air in and out of your lungs. After surgery. What are the risks? Risks of using CHG include: A skin reaction. Hearing loss, if CHG gets in your ears and you have a perforated eardrum. Eye injury, if CHG gets in your eyes and is not rinsed out. The CHG product catching fire. Make sure that you avoid smoking and flames after applying CHG to your skin. Do not use CHG: If you have a chlorhexidine allergy or have previously reacted to chlorhexidine. On babies younger than 2 months of  age. How to use CHG solution Use CHG only as told by your health care provider, and follow the instructions on the label. Use the full amount of CHG as directed. Usually, this is one bottle. During a shower Follow these steps when using CHG solution during a shower (unless your health care provider gives you different instructions): Start the shower. Use your normal soap and shampoo to wash your face and hair. Turn off the shower or move out of the shower stream. Pour the CHG onto a clean washcloth. Do not use any type of brush or rough-edged sponge. Starting at your neck, lather your body down to your toes. Make sure you follow these instructions: If you will be having surgery, pay special attention to the part of your body where you will be having surgery. Scrub this area for at least 1 minute. Do not use CHG on your head or face. If the solution gets into your ears or eyes, rinse them well with water. Avoid your genital area. Avoid any areas of skin that have broken skin, cuts, or scrapes. Scrub your back and under your arms. Make sure to wash skin folds. Let the lather sit on your skin for 1-2 minutes or as long as told by your health care provider. Thoroughly rinse your entire body in the shower. Make sure that all body creases and crevices are rinsed well. Dry off with a clean towel. Do not put any substances on your body afterward--such as powder, lotion, or perfume--unless you are told to do so by your health care provider. Only use lotions that are recommended by the manufacturer. Put on clean clothes or pajamas. If it is the night before your surgery, sleep in clean sheets.  During a sponge bath Follow these steps when using CHG solution during a sponge bath (unless your health care provider gives you different instructions): Use your normal soap and shampoo to wash your face and hair. Pour the CHG onto a clean washcloth. Starting at your neck, lather your body down to your toes.  Make sure you follow these instructions: If you will be having surgery, pay special attention to the part of your body where you will be having surgery. Scrub this area for at least 1 minute. Do not use CHG on your head or face. If the solution gets into your ears or eyes, rinse them well with water. Avoid your genital area. Avoid any areas of skin that have broken skin, cuts, or scrapes. Scrub your back and under your arms. Make sure to wash skin folds. Let the lather sit on your skin for 1-2 minutes or as long as told by your health care provider. Using a different clean, wet washcloth, thoroughly rinse your entire body. Make sure that all body creases and crevices are rinsed well. Dry off with a clean towel. Do not put any substances on your body afterward--such as powder, lotion, or perfume--unless you are told to do so by your health care provider. Only use lotions that are recommended by the manufacturer. Put on clean clothes or pajamas. If it is the night before your surgery, sleep in clean sheets. How to use CHG prepackaged cloths Only use CHG cloths as told by your health care provider, and follow the instructions on the label. Use the CHG cloth on clean, dry skin. Do not use the CHG cloth on your head or face unless your health care provider tells you to. When washing with the CHG cloth: Avoid your genital area. Avoid any areas of skin that have broken skin, cuts, or scrapes. Before surgery Follow these steps when using a CHG cloth to clean before surgery (unless your health care provider gives you different instructions): Using the CHG cloth, vigorously scrub the part of your body where you will be having surgery. Scrub using a back-and-forth motion for 3 minutes. The area on your body should be completely wet with CHG when you are done scrubbing. Do not rinse. Discard the cloth and let the area air-dry. Do not put any substances on the area afterward, such as powder, lotion, or  perfume. Put on clean clothes or pajamas. If it is the night before your surgery, sleep in clean sheets.  For general bathing Follow these steps when using CHG cloths for general bathing (unless your health care provider gives you different instructions). Use a separate CHG cloth for each area of your body. Make sure you wash between any folds of skin and between your fingers and toes. Wash your body in the following order, switching to a new cloth after each step: The front of your neck, shoulders, and chest. Both of your arms, under your arms, and your hands. Your stomach and groin area, avoiding the genitals. Your right leg and foot. Your left leg and foot. The back of your neck, your back, and your buttocks. Do not rinse. Discard the cloth and let the area air-dry. Do not put any substances on your body afterward--such as powder, lotion, or perfume--unless you are told to do so by your health care provider. Only use lotions that are recommended by the manufacturer. Put on clean clothes or pajamas. Contact a health care provider if: Your skin gets irritated after scrubbing. You have questions about using your solution or cloth. You swallow any chlorhexidine. Call your local poison control center (8043223788 in the [place] Get help right away if: Your eyes itch badly, or they become very red or swollen. Your skin itches badly and is red or swollen. Your hearing changes. You have trouble seeing. You have swelling  or tingling in your mouth or throat. You have trouble breathing. These symptoms may represent a serious problem that is an emergency. Do not wait to see if the symptoms will go away. Get medical help right away. Call your local emergency services (911 in the [place] Do not drive yourself to the hospital. Summary Chlorhexidine gluconate (CHG) is a germ-killing (antiseptic) solution that is used to clean the skin. Cleaning your skin with CHG may help to lower your risk for  infection. You may be given CHG to use for bathing. It may be in a bottle or in a prepackaged cloth to use on your skin. Carefully follow your health care provider's instructions and the instructions on the product label. Do not use CHG if you have a chlorhexidine allergy. Contact your health care provider if your skin gets irritated after scrubbing. This information is not intended to replace advice given to you by your health care provider. Make sure you discuss any questions you have with your health care provider. Document Revised: 01/25/2022 Document Reviewed: 12/08/2020 Elsevier Patient Education  2023 [employer].

## 2023-04-18 ENCOUNTER — Encounter (HOSPITAL_COMMUNITY)
Admission: RE | Admit: 2023-04-18 | Discharge: 2023-04-18 | Disposition: A | Discharge status: Home / Self Care | Payer: PPO | Source: Ambulatory Visit | Attending: Podiatry | Admitting: Podiatry

## 2023-04-18 ENCOUNTER — Ambulatory Visit (HOSPITAL_COMMUNITY)
Admission: RE | Admit: 2023-04-18 | Discharge: 2023-04-18 | Disposition: A | Discharge status: Home / Self Care | Payer: PPO | Source: Ambulatory Visit | Attending: Podiatry | Admitting: Podiatry

## 2023-04-18 ENCOUNTER — Encounter (HOSPITAL_COMMUNITY): Payer: Self-pay

## 2023-04-18 ENCOUNTER — Ambulatory Visit (HOSPITAL_COMMUNITY): Admission: RE | Admit: 2023-04-18 | Payer: PPO | Source: Ambulatory Visit

## 2023-04-18 VITALS — BP 122/73 | HR 68 | Temp 97.6°F | Resp 18 | Ht 71.0 in | Wt 218.5 lb

## 2023-04-18 DIAGNOSIS — D649 Anemia, unspecified: Secondary | ICD-10-CM | POA: Insufficient documentation

## 2023-04-18 DIAGNOSIS — S92321A Displaced fracture of second metatarsal bone, right foot, initial encounter for closed fracture: Secondary | ICD-10-CM | POA: Diagnosis not present

## 2023-04-18 DIAGNOSIS — Z79899 Other long term (current) drug therapy: Secondary | ICD-10-CM | POA: Diagnosis not present

## 2023-04-18 DIAGNOSIS — M19071 Primary osteoarthritis, right ankle and foot: Secondary | ICD-10-CM | POA: Diagnosis not present

## 2023-04-18 DIAGNOSIS — M2011 Hallux valgus (acquired), right foot: Secondary | ICD-10-CM | POA: Diagnosis not present

## 2023-04-18 DIAGNOSIS — M79674 Pain in right toe(s): Secondary | ICD-10-CM | POA: Diagnosis not present

## 2023-04-18 DIAGNOSIS — Z01818 Encounter for other preprocedural examination: Secondary | ICD-10-CM | POA: Diagnosis not present

## 2023-04-18 DIAGNOSIS — M2041 Other hammer toe(s) (acquired), right foot: Secondary | ICD-10-CM | POA: Diagnosis not present

## 2023-04-18 LAB — CBC WITH DIFFERENTIAL/PLATELET
Abs Immature Granulocytes: 0.01 10*3/uL (ref 0.00–0.07)
Basophils Absolute: 0 10*3/uL (ref 0.0–0.1)
Basophils Relative: 0 %
Eosinophils Absolute: 0.1 10*3/uL (ref 0.0–0.5)
Eosinophils Relative: 2 %
HCT: 31.8 % — ABNORMAL LOW (ref 36.0–46.0)
Hemoglobin: 11 g/dL — ABNORMAL LOW (ref 12.0–15.0)
Immature Granulocytes: 0 %
Lymphocytes Relative: 46 %
Lymphs Abs: 2.6 10*3/uL (ref 0.7–4.0)
MCH: 34.1 pg — ABNORMAL HIGH (ref 26.0–34.0)
MCHC: 34.6 g/dL (ref 30.0–36.0)
MCV: 98.5 fL (ref 80.0–100.0)
Monocytes Absolute: 0.5 10*3/uL (ref 0.1–1.0)
Monocytes Relative: 9 %
Neutro Abs: 2.4 10*3/uL (ref 1.7–7.7)
Neutrophils Relative %: 43 %
Platelets: 221 10*3/uL (ref 150–400)
RBC: 3.23 MIL/uL — ABNORMAL LOW (ref 3.87–5.11)
RDW: 12.7 % (ref 11.5–15.5)
WBC: 5.7 10*3/uL (ref 4.0–10.5)
nRBC: 0 % (ref 0.0–0.2)

## 2023-04-18 LAB — BASIC METABOLIC PANEL
Anion gap: 9 (ref 5–15)
BUN: 12 mg/dL (ref 8–23)
CO2: 24 mmol/L (ref 22–32)
Calcium: 9.1 mg/dL (ref 8.9–10.3)
Chloride: 105 mmol/L (ref 98–111)
Creatinine, Ser: 1.05 mg/dL — ABNORMAL HIGH (ref 0.44–1.00)
GFR, Estimated: 57 mL/min — ABNORMAL LOW (ref >60)
Glucose, Bld: 81 mg/dL (ref 70–99)
Potassium: 3.4 mmol/L — ABNORMAL LOW (ref 3.5–5.1)
Sodium: 138 mmol/L (ref 135–145)

## 2023-04-20 ENCOUNTER — Ambulatory Visit (HOSPITAL_COMMUNITY)
Admission: RE | Admit: 2023-04-20 | Discharge: 2023-04-20 | Disposition: A | Discharge status: Home / Self Care | Payer: PPO | Attending: Podiatry | Admitting: Podiatry

## 2023-04-20 ENCOUNTER — Ambulatory Visit (HOSPITAL_COMMUNITY): Payer: PPO

## 2023-04-20 ENCOUNTER — Other Ambulatory Visit: Payer: Self-pay

## 2023-04-20 ENCOUNTER — Ambulatory Visit (HOSPITAL_COMMUNITY): Payer: Self-pay | Admitting: Anesthesiology

## 2023-04-20 ENCOUNTER — Ambulatory Visit (HOSPITAL_BASED_OUTPATIENT_CLINIC_OR_DEPARTMENT_OTHER): Payer: PPO | Admitting: Anesthesiology

## 2023-04-20 ENCOUNTER — Encounter (HOSPITAL_COMMUNITY)
Admission: RE | Disposition: A | Discharge status: Home / Self Care | Payer: Self-pay | Source: Home / Self Care | Attending: Podiatry

## 2023-04-20 ENCOUNTER — Encounter (HOSPITAL_COMMUNITY): Payer: Self-pay

## 2023-04-20 DIAGNOSIS — L84 Corns and callosities: Secondary | ICD-10-CM | POA: Insufficient documentation

## 2023-04-20 DIAGNOSIS — K219 Gastro-esophageal reflux disease without esophagitis: Secondary | ICD-10-CM | POA: Insufficient documentation

## 2023-04-20 DIAGNOSIS — Z79899 Other long term (current) drug therapy: Secondary | ICD-10-CM | POA: Diagnosis not present

## 2023-04-20 DIAGNOSIS — I10 Essential (primary) hypertension: Secondary | ICD-10-CM | POA: Diagnosis not present

## 2023-04-20 DIAGNOSIS — F329 Major depressive disorder, single episode, unspecified: Secondary | ICD-10-CM | POA: Insufficient documentation

## 2023-04-20 DIAGNOSIS — M205X1 Other deformities of toe(s) (acquired), right foot: Secondary | ICD-10-CM | POA: Diagnosis not present

## 2023-04-20 DIAGNOSIS — M19071 Primary osteoarthritis, right ankle and foot: Secondary | ICD-10-CM | POA: Diagnosis not present

## 2023-04-20 DIAGNOSIS — L851 Acquired keratosis [keratoderma] palmaris et plantaris: Secondary | ICD-10-CM | POA: Diagnosis not present

## 2023-04-20 DIAGNOSIS — M79674 Pain in right toe(s): Secondary | ICD-10-CM | POA: Diagnosis not present

## 2023-04-20 DIAGNOSIS — Z9889 Other specified postprocedural states: Secondary | ICD-10-CM | POA: Diagnosis not present

## 2023-04-20 DIAGNOSIS — E785 Hyperlipidemia, unspecified: Secondary | ICD-10-CM | POA: Insufficient documentation

## 2023-04-20 HISTORY — PX: TOE ARTHROPLASTY: SHX6504

## 2023-04-20 SURGERY — ARTHROPLASTY, TOE
Anesthesia: General | Site: Toe | Laterality: Right

## 2023-04-20 MED ORDER — LACTATED RINGERS IV SOLN: INTRAVENOUS | Status: DC

## 2023-04-20 MED ORDER — LIDOCAINE HCL 1 % IJ SOLN
INTRAMUSCULAR | Status: DC | PRN
  Administered 2023-04-20: 8 mL via INTRAMUSCULAR

## 2023-04-20 MED ORDER — CHLORHEXIDINE GLUCONATE CLOTH 2 % EX PADS: 6.0000 | MEDICATED_PAD | Freq: Once | CUTANEOUS | Status: DC

## 2023-04-20 MED ORDER — DEXMEDETOMIDINE HCL IN NACL 80 MCG/20ML IV SOLN
INTRAVENOUS | Status: AC
  Filled 2023-04-20: qty 20

## 2023-04-20 MED ORDER — FENTANYL CITRATE (PF) 100 MCG/2ML IJ SOLN
INTRAMUSCULAR | Status: DC | PRN
  Administered 2023-04-20 (×2): 25 ug via INTRAVENOUS

## 2023-04-20 MED ORDER — CLINDAMYCIN PHOSPHATE 900 MG/50ML IV SOLN
900.0000 mg | INTRAVENOUS | Status: AC
  Administered 2023-04-20: 900 mg via INTRAVENOUS

## 2023-04-20 MED ORDER — ORAL CARE MOUTH RINSE: 15.0000 mL | Freq: Once | OROMUCOSAL | Status: AC

## 2023-04-20 MED ORDER — PROPOFOL 10 MG/ML IV BOLUS
INTRAVENOUS | Status: DC | PRN
  Administered 2023-04-20 (×3): 20 mg via INTRAVENOUS

## 2023-04-20 MED ORDER — PROPOFOL 500 MG/50ML IV EMUL
INTRAVENOUS | Status: AC
  Filled 2023-04-20: qty 50

## 2023-04-20 MED ORDER — LIDOCAINE HCL (PF) 1 % IJ SOLN
INTRAMUSCULAR | Status: AC
  Filled 2023-04-20: qty 30

## 2023-04-20 MED ORDER — ONDANSETRON HCL 4 MG/2ML IJ SOLN
INTRAMUSCULAR | Status: AC
  Filled 2023-04-20: qty 2

## 2023-04-20 MED ORDER — PROPOFOL 500 MG/50ML IV EMUL
INTRAVENOUS | Status: DC | PRN
  Administered 2023-04-20: 50 ug/kg/min via INTRAVENOUS

## 2023-04-20 MED ORDER — ONDANSETRON HCL 4 MG/2ML IJ SOLN
INTRAMUSCULAR | Status: DC | PRN
  Administered 2023-04-20: 4 mg via INTRAVENOUS

## 2023-04-20 MED ORDER — FENTANYL CITRATE (PF) 100 MCG/2ML IJ SOLN
INTRAMUSCULAR | Status: AC
  Filled 2023-04-20: qty 2

## 2023-04-20 MED ORDER — CLINDAMYCIN PHOSPHATE 900 MG/50ML IV SOLN
INTRAVENOUS | Status: AC
  Filled 2023-04-20: qty 50

## 2023-04-20 MED ORDER — LIDOCAINE HCL (PF) 2 % IJ SOLN
INTRAMUSCULAR | Status: AC
  Filled 2023-04-20: qty 5

## 2023-04-20 MED ORDER — PROPOFOL 10 MG/ML IV BOLUS
INTRAVENOUS | Status: AC
  Filled 2023-04-20: qty 20

## 2023-04-20 MED ORDER — MIDAZOLAM HCL 2 MG/2ML IJ SOLN
INTRAMUSCULAR | Status: DC | PRN
  Administered 2023-04-20: 1 mg via INTRAVENOUS

## 2023-04-20 MED ORDER — BUPIVACAINE HCL (PF) 0.5 % IJ SOLN
INTRAMUSCULAR | Status: AC
  Filled 2023-04-20: qty 30

## 2023-04-20 MED ORDER — ROCURONIUM BROMIDE 10 MG/ML (PF) SYRINGE
PREFILLED_SYRINGE | INTRAVENOUS | Status: AC
  Filled 2023-04-20: qty 10

## 2023-04-20 MED ORDER — CHLORHEXIDINE GLUCONATE 0.12 % MT SOLN
15.0000 mL | Freq: Once | OROMUCOSAL | Status: AC
  Administered 2023-04-20: 15 mL via OROMUCOSAL

## 2023-04-20 MED ORDER — MIDAZOLAM HCL 2 MG/2ML IJ SOLN
INTRAMUSCULAR | Status: AC
  Filled 2023-04-20: qty 2

## 2023-04-20 MED ORDER — 0.9 % SODIUM CHLORIDE (POUR BTL) OPTIME
TOPICAL | Status: DC | PRN
  Administered 2023-04-20: 1000 mL

## 2023-04-20 SURGICAL SUPPLY — 53 items
APL PRP STRL LF DISP 70% ISPRP (MISCELLANEOUS) ×1
APL SKNCLS STERI-STRIP NONHPOA (GAUZE/BANDAGES/DRESSINGS) ×1
BANDAGE ESMARK 4X12 BL STRL LF (DISPOSABLE) ×1 IMPLANT
BENZOIN TINCTURE PRP APPL 2/3 (GAUZE/BANDAGES/DRESSINGS) ×1 IMPLANT
BLADE AVERAGE 25X9 (BLADE) ×1 IMPLANT
BLADE OSC/SAG 11.5X5.5X.38 (BLADE) ×1 IMPLANT
BLADE OSC/SAGITTAL MD 5.5X18 (BLADE) IMPLANT
BLADE SURG 15 STRL LF DISP TIS (BLADE) ×2 IMPLANT
BLADE SURG 15 STRL SS (BLADE) ×2
BNDG CMPR 12X4 ELC STRL LF (DISPOSABLE) ×1
BNDG CMPR STD VLCR NS LF 5.8X3 (GAUZE/BANDAGES/DRESSINGS) ×1
BNDG CONFORM 2 STRL LF (GAUZE/BANDAGES/DRESSINGS) ×1 IMPLANT
BNDG ELASTIC 3X5.8 VLCR NS LF (GAUZE/BANDAGES/DRESSINGS) ×1 IMPLANT
BNDG ESMARK 4X12 BLUE STRL LF (DISPOSABLE) ×1
BNDG GAUZE ELAST 4 BULKY (GAUZE/BANDAGES/DRESSINGS) ×1 IMPLANT
BUR FAST CUTTING (BURR)
BUR SRGRND 54.5X2.4X8 (BURR) IMPLANT
BUR SRGRND 54.5X3.2X8 (BURR) IMPLANT
BURR SRGRND 54.5X2.4X8 (BURR)
BURR SRGRND 54.5X3.2X8 (BURR)
CHLORAPREP W/TINT 26 (MISCELLANEOUS) ×1 IMPLANT
CLOTH BEACON ORANGE TIMEOUT ST (SAFETY) ×1 IMPLANT
COVER LIGHT HANDLE STERIS (MISCELLANEOUS) ×2 IMPLANT
CUFF TOURN SGL QUICK 18X4 (TOURNIQUET CUFF) ×1 IMPLANT
DECANTER SPIKE VIAL GLASS SM (MISCELLANEOUS) ×2 IMPLANT
DRSG ADAPTIC 3X8 NADH LF (GAUZE/BANDAGES/DRESSINGS) ×1 IMPLANT
ELECT REM PT RETURN 9FT ADLT (ELECTROSURGICAL) ×1
ELECTRODE REM PT RTRN 9FT ADLT (ELECTROSURGICAL) ×1 IMPLANT
GAUZE 4X4 16PLY ~~LOC~~+RFID DBL (SPONGE) ×1 IMPLANT
GAUZE SPONGE 4X4 12PLY STRL (GAUZE/BANDAGES/DRESSINGS) ×1 IMPLANT
GLOVE BIOGEL PI IND STRL 7.0 (GLOVE) ×2 IMPLANT
GLOVE BIOGEL PI IND STRL 7.5 (GLOVE) ×1 IMPLANT
GLOVE ECLIPSE 7.0 STRL STRAW (GLOVE) ×1 IMPLANT
GOWN STRL REUS W/ TWL LRG LVL3 (GOWN DISPOSABLE) ×1 IMPLANT
GOWN STRL REUS W/TWL LRG LVL3 (GOWN DISPOSABLE) ×3 IMPLANT
KIT TURNOVER KIT A (KITS) ×1 IMPLANT
MANIFOLD NEPTUNE II (INSTRUMENTS) ×1 IMPLANT
NDL HYPO 25X1 1.5 SAFETY (NEEDLE) ×2 IMPLANT
NEEDLE HYPO 25X1 1.5 SAFETY (NEEDLE) ×2 IMPLANT
NS IRRIG 1000ML POUR BTL (IV SOLUTION) ×1 IMPLANT
PACK BASIC LIMB (CUSTOM PROCEDURE TRAY) ×1 IMPLANT
PAD ARMBOARD 7.5X6 YLW CONV (MISCELLANEOUS) ×1 IMPLANT
PADDING CAST COTTON 2X4 NS (CAST SUPPLIES) IMPLANT
POSITIONER HEAD 8X9X4 ADT (SOFTGOODS) ×1 IMPLANT
RASP SM TEAR CROSS CUT (RASP) IMPLANT
SET BASIN LINEN APH (SET/KITS/TRAYS/PACK) ×1 IMPLANT
STRIP CLOSURE SKIN 1/2X4 (GAUZE/BANDAGES/DRESSINGS) ×1 IMPLANT
SUT ETHILON 4 0 PS 2 18 (SUTURE) IMPLANT
SUT PROLENE 4 0 PS 2 18 (SUTURE) ×1 IMPLANT
SUT VIC AB 2-0 CT2 27 (SUTURE) IMPLANT
SUT VIC AB 4-0 PS2 27 (SUTURE) IMPLANT
SUT VICRYL AB 3-0 FS1 BRD 27IN (SUTURE) IMPLANT
SYR CONTROL 10ML LL (SYRINGE) ×2 IMPLANT

## 2023-04-20 NOTE — Anesthesia Postprocedure Evaluation (Signed)
Anesthesia Post Note  Patient: Jamie Hunt  Procedure(s) Performed: DEROTATIONAL ARHTHROPLASTY OF RIGHT 5TH TOE (Right: Toe)  Patient location during evaluation: Phase II Anesthesia Type: General Level of consciousness: awake and alert and oriented Pain management: pain level controlled Vital Signs Assessment: post-procedure vital signs reviewed and stable Respiratory status: spontaneous breathing, nonlabored ventilation and respiratory function stable Cardiovascular status: blood pressure returned to baseline and stable Postop Assessment: no apparent nausea or vomiting Anesthetic complications: no  No notable events documented.   Last Vitals:  Vitals:   04/20/23 0821  BP: 129/77  Pulse: 65  Resp: 13  Temp: (!) 36.4 C  SpO2: 98%    Last Pain:  Vitals:   04/20/23 0821  PainSc: 0-No pain                 Natia Fahmy C Lian Tanori

## 2023-04-20 NOTE — Discharge Instructions (Signed)

## 2023-04-20 NOTE — Op Note (Signed)
04/20/2023  8:16 AM  PATIENT:  Jamie Hunt  72 y.o. 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   LOCAL MEDICATIONS USED:  MARCAINE   , LIDOCAINE , and Amount: 8 ml  TOURNIQUET:   Total Tourniquet Time Documented: Calf (Right) - -38 minutes Total: Calf (Right) - -38 minutes  Patient was brought into the operating room laid supine on the operating table. Ankle tourniquet was applied to the surgical extremity. Following IV sedation, a local block was achieved using 8 cc of mixture of 1% plain lidocaine with 0.5% marcaine. The foot was the prepped, scrubbed and draped in aseptic manner. Using an esmarch band the tourniquet on the surgical site was inflatted at .    Attention was directed towards the right fifth toe. There is callous noted on the medial aspect of the right fifth toe at PIPJ. Varus deformity of the toe noted. A semi-elliptical incision was marked over the right fifth PIPJ. Using #15 blade, incision was made  and skin wedge was removed. Subcuteneous tissue were carefully directed. At this time transverse tenotomy of the extensor tendon was performed. Upon reflecting the tendon, the head of the proximal phalanx was visible with prominent medial and lateral boney prominence noted on the head. Using sagittal saw the head was resected and dissected out. Bone rasp was used to rasp down any sharp edges on the remaining phalanx. The wound was irrigated. The extensor tendon was repaired back using 2-0 Vicryl. The skin was closed using 3-0 Prolene. The callous on the medial aspect was debrided using #15  blade. Dry sterile dressing applied. The tourniquet was deflated. Capillary refill time was brisk to all the toes. Patient was transferred to PACU with VSS. Patient will be placed in surgical shoe with limited weight bearing to the right foot.

## 2023-04-20 NOTE — Progress Notes (Signed)
Ms Daniel has a small quarter sized area on right side of her forehead.  She stated she bumped it in the waiting room bathroom on the hook on the back of the stall door.

## 2023-04-20 NOTE — Anesthesia Preprocedure Evaluation (Signed)
Anesthesia Evaluation  Patient identified by MRN, date of birth, ID band Patient awake    Reviewed: Allergy & Precautions, H&P , NPO status , Patient's Chart, lab work & pertinent test results  History of Anesthesia Complications (+) PONV and history of anesthetic complications  Airway Mallampati: II  TM Distance: >3 FB Neck ROM: Full    Dental no notable dental hx.    Pulmonary neg pulmonary ROS   Pulmonary exam normal breath sounds clear to auscultation       Cardiovascular Exercise Tolerance: Good hypertension, Pt. on medications Normal cardiovascular exam+ dysrhythmias  Rhythm:Regular Rate:Normal     Neuro/Psych negative neurological ROS  negative psych ROS   GI/Hepatic Neg liver ROS,GERD  Medicated and Controlled,,  Endo/Other  negative endocrine ROS    Renal/GU negative Renal ROS  negative genitourinary   Musculoskeletal  (+) Arthritis , Osteoarthritis,    Abdominal   Peds negative pediatric ROS (+)  Hematology  (+) Blood dyscrasia, anemia   Anesthesia Other Findings   Reproductive/Obstetrics negative OB ROS                             Anesthesia Physical Anesthesia Plan  ASA: 2  Anesthesia Plan: General   Post-op Pain Management: Dilaudid IV and Minimal or no pain anticipated   Induction: Intravenous  PONV Risk Score and Plan: 2 and Ondansetron, Propofol infusion and Dexamethasone  Airway Management Planned: Nasal Cannula, Natural Airway and Simple Face Mask  Additional Equipment:   Intra-op Plan:   Post-operative Plan:   Informed Consent: I have reviewed the patients History and Physical, chart, labs and discussed the procedure including the risks, benefits and alternatives for the proposed anesthesia with the patient or authorized representative who has indicated his/her understanding and acceptance.     Dental advisory given  Plan Discussed with: CRNA and  Surgeon  Anesthesia Plan Comments: (Possible GA with airway was explained )       Anesthesia Quick Evaluation

## 2023-04-20 NOTE — Transfer of Care (Signed)
Immediate Anesthesia Transfer of Care Note  Patient: Jamie Hunt  Procedure(s) Performed: DEROTATIONAL ARHTHROPLASTY OF RIGHT 5TH TOE (Right: Toe)  Patient Location: Short Stay  Anesthesia Type:General  Level of Consciousness: awake, alert , and oriented  Airway & Oxygen Therapy: Patient Spontanous Breathing  Post-op Assessment: Report given to RN and Post -op Vital signs reviewed and stable  Post vital signs: Reviewed and stable  Last Vitals:  Vitals Value Taken Time  BP 129/77 04/20/23 0821  Temp 36.4 C 04/20/23 0821  Pulse 65 04/20/23 0821  Resp 13 04/20/23 0821  SpO2 98 % 04/20/23 0821    Last Pain:  Vitals:   04/20/23 0821  PainSc: 0-No pain         Complications: No notable events documented.

## 2023-04-20 NOTE — Brief Op Note (Signed)
04/20/2023  8:16 AM  PATIENT:  Estill Cotta  72 y.o. 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  PROCEDURE:  Procedure(s): DEROTATIONAL ARHTHROPLASTY OF RIGHT 5TH TOE (Right)  SURGEON:  Surgeon(s) and Role:    * Erskine Emery, DPM - Primary  PHYSICIAN ASSISTANT:   ASSISTANTS: none   ANESTHESIA:   local and MAC  EBL:  0 mL   BLOOD ADMINISTERED:none  DRAINS: none   LOCAL MEDICATIONS USED:  MARCAINE   , LIDOCAINE , and Amount: 8 ml  SPECIMEN:  No Specimen  DISPOSITION OF SPECIMEN:  N/A  COUNTS:  YES  TOURNIQUET:   Total Tourniquet Time Documented: Calf (Right) - -38 minutes Total: Calf (Right) - -38 minutes   DICTATION: .Dragon Dictation  PLAN OF CARE: Discharge to home after PACU  PATIENT DISPOSITION:  PACU - hemodynamically stable.   Delay start of Pharmacological VTE agent (>24hrs) due to surgical blood loss or risk of bleeding: not applicable

## 2023-04-20 NOTE — H&P (Signed)
.  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  The various methods of treatment have been discussed with the patient.  No guarantees were given.  After consideration of risks, benefits and other options for treatment, the patient has consented to surgery.  I have reviewed the patients' chart and labs.    No data found.  A history and physical examination was performed in my office.  The patient was reexamined.  There have been no changes to this history and physical examination.  Erskine Emery DPM

## 2023-04-25 DIAGNOSIS — Z4889 Encounter for other specified surgical aftercare: Secondary | ICD-10-CM | POA: Diagnosis not present

## 2023-04-26 ENCOUNTER — Encounter (HOSPITAL_COMMUNITY): Payer: Self-pay | Admitting: Podiatry

## 2023-04-28 ENCOUNTER — Ambulatory Visit: Payer: PPO | Admitting: Gastroenterology

## 2023-05-09 DIAGNOSIS — Z4889 Encounter for other specified surgical aftercare: Secondary | ICD-10-CM | POA: Diagnosis not present

## 2023-05-11 ENCOUNTER — Telehealth: Payer: Self-pay | Admitting: *Deleted

## 2023-05-11 NOTE — Telephone Encounter (Signed)
Ortho bundle 1 year call completed. ?

## 2023-05-12 DIAGNOSIS — K219 Gastro-esophageal reflux disease without esophagitis: Secondary | ICD-10-CM | POA: Diagnosis not present

## 2023-05-12 DIAGNOSIS — I10 Essential (primary) hypertension: Secondary | ICD-10-CM | POA: Diagnosis not present

## 2023-05-30 DIAGNOSIS — Z4889 Encounter for other specified surgical aftercare: Secondary | ICD-10-CM | POA: Diagnosis not present

## 2023-06-10 DIAGNOSIS — I10 Essential (primary) hypertension: Secondary | ICD-10-CM | POA: Diagnosis not present

## 2023-06-12 DIAGNOSIS — I10 Essential (primary) hypertension: Secondary | ICD-10-CM | POA: Diagnosis not present

## 2023-06-12 DIAGNOSIS — K219 Gastro-esophageal reflux disease without esophagitis: Secondary | ICD-10-CM | POA: Diagnosis not present

## 2023-06-20 ENCOUNTER — Encounter: Payer: Self-pay | Admitting: Gastroenterology

## 2023-06-20 ENCOUNTER — Ambulatory Visit: Payer: PPO | Admitting: Gastroenterology

## 2023-06-20 VITALS — BP 138/76 | HR 64 | Temp 97.6°F | Ht 71.0 in | Wt 210.0 lb

## 2023-06-20 DIAGNOSIS — K219 Gastro-esophageal reflux disease without esophagitis: Secondary | ICD-10-CM

## 2023-06-20 DIAGNOSIS — K5900 Constipation, unspecified: Secondary | ICD-10-CM

## 2023-06-20 DIAGNOSIS — R140 Abdominal distension (gaseous): Secondary | ICD-10-CM | POA: Diagnosis not present

## 2023-06-20 MED ORDER — OMEPRAZOLE 40 MG PO CPDR
40.0000 mg | DELAYED_RELEASE_CAPSULE | Freq: Two times a day (BID) | ORAL | 3 refills | Status: DC
Start: 2023-06-20 — End: 2023-09-26

## 2023-06-20 MED ORDER — TRULANCE 3 MG PO TABS: 3.0000 mg | ORAL_TABLET | Freq: Every day | ORAL | 1 refills | Status: DC

## 2023-06-20 NOTE — Progress Notes (Signed)
GI Office Note    Referring Provider: Benetta Spar* Primary Care Physician:  Benetta Spar, MD Primary Gastroenterologist: Hennie Duos. Marletta Lor, DO  Date:  06/20/2023  ID:  Jamie Hunt, Jamie Hunt [birth year], MRN [id number]   Chief Complaint   Chief Complaint  Patient presents with   Follow-up    Follow up. Still having problems having a bowel movement. Having one every 3 to 4 days.    History of Present Illness  Jamie Hunt is a 72 y.o. female with a history of GERD, dysphagia, constipation, anemia, HTN, and HLD presenting today with complaint of worsening constipation.  EGD December 2022: mild schatzki ring s/p dilation, gastritis with reactive gastropathy, normal duodenum. No H. Pylori   Colonoscopy December 2022: non-bleeding internal hemorrhoids, transverse and ascending diverticulosis, two 7-9 mm polyps in the ascending and cecum with biopsy revealing tubular adenomas. Due for repeat colonoscopy in 2027.   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  Office visit 08/09/22. Reflux with raw vegetables, fried foods, salads and sweets. Issues also with tomato products. Recently increased omeprazole to 20 mg twice daily on most days. Rare nausea. Taking fiber supplements and stool softener daily. Stools daily or every other day. Feels better if doesn't overeat. Advised to increase omeprazole to 40 mg BID. GERD diet/lifestyle modifications reinforced. Trial wean of stool softener given controlled Bms. F/u in 4 months.    Last office visit 11/08/22. Having a daily bowel movement for the most part, occasionally skipping a day.  Had recently stop stool softeners.  Taking Beano for gas about twice per day.  Takes 2 spoonfuls of fiber every morning.  Denied any melena or BRBPR.  Taking omeprazole twice daily and still having some occasional reflux with raw vegetables.  Denied any nausea, vomiting, or dysphagia.  Advised to continue  daily fiber supplement.  Continue omeprazole 40 mg daily and take Pepcid twice daily as needed for breakthrough.  Continue Gas-X.  Today: Constipation - She reports her BP medication was increased and her cholesterol medication was changed since last visit. Sometimes she foes every 2-3 days for a BM and may have to spend 15 minutes or say on the commode. Has been having to strain. Has been eating apples, bananas, cherries, yogurt etc to help but not doing much. Has tried miralax once daily in the past. Had only tried it for a couple of weeks. Still has been taking metamucil.   GERD - Has been taking omeprazole once daily and then Pepcid in the evening and having everyday symptoms. Symptoms worsen in the evenings. Certain things bother her like salads and nuts. She has been trying to avoid those triggers and to eat 2-3 hours before going to bed. Tries to eat before 5 pm. Goes to bed around 9/9:30.   Current Outpatient Medications  Medication Sig Dispense Refill   acetaminophen (TYLENOL) 650 MG CR tablet Take 1,300 mg by mouth in the morning and at bedtime.     aspirin 81 MG chewable tablet Chew 1 tablet (81 mg total) by mouth 2 (two) times daily. (Patient taking differently: Chew 81 mg by mouth daily.) 30 tablet 0   Calcium Carb-Cholecalciferol (CALCIUM 500 + D PO) Take 1 tablet by mouth daily.     cholecalciferol (VITAMIN D3) 25 MCG (1000 UT) tablet Take 1,000 Units by mouth in the morning.     escitalopram (LEXAPRO) 10 MG tablet Take 10 mg by mouth at bedtime.  famotidine (PEPCID) 20 MG tablet Take 20 mg by mouth every evening.     hydrALAZINE (APRESOLINE) 50 MG tablet Take 1 tablet (50 mg total) by mouth 3 (three) times daily. 270 tablet 3   hydrochlorothiazide (HYDRODIURIL) 25 MG tablet Take 25 mg by mouth in the morning.     losartan (COZAAR) 100 MG tablet Take 100 mg by mouth in the morning.     Menthol, Topical Analgesic, (BIOFREEZE) 10 % CREA Apply 1 Application topically daily as needed  (pain). Sometimes use roll-on     montelukast (SINGULAIR) 10 MG tablet Take 10 mg by mouth at bedtime.     Multiple Vitamin (MULTIVITAMIN WITH MINERALS) TABS tablet Take 1 tablet by mouth in the morning.     omeprazole (PRILOSEC) 40 MG capsule Take 1 capsule by mouth once daily 60 capsule 3   Polyethyl Glycol-Propyl Glycol (SYSTANE OP) Place 1 drop into both eyes 2 (two) times daily.     Potassium 99 MG TABS Take 99 mg by mouth in the morning.     rosuvastatin (CRESTOR) 40 MG tablet Take 1 tablet (40 mg total) by mouth daily. (Patient taking differently: Take 40 mg by mouth every evening.) 90 tablet 3   traZODone (DESYREL) 50 MG tablet Take 50 mg by mouth at bedtime.     verapamil (CALAN-SR) 240 MG CR tablet Take 240 mg by mouth at bedtime.     No current facility-administered medications for this visit.    Past Medical History:  Diagnosis Date   Acid reflux    Anemia    Arthritis    High cholesterol    Hypertension    LBBB (left bundle branch block)    PONV (postoperative nausea and vomiting)    Shingles     Past Surgical History:  Procedure Laterality Date   ABDOMINAL HYSTERECTOMY  1995   BACK SURGERY  2014   L4-L5   BALLOON DILATION N/A 09/11/2021   Procedure: BALLOON DILATION;  Surgeon: Lanelle Bal, DO;  [facility];  Service: Endoscopy;  Laterality: N/A;   BIOPSY  09/11/2021   Procedure: BIOPSY;  Surgeon: Lanelle Bal, DO;  [facility]: [facility];  Service: Endoscopy;;   CATARACT EXTRACTION W/PHACO Right 05/18/2019   Procedure: CATARACT EXTRACTION PHACO AND INTRAOCULAR LENS PLACEMENT (IOC);  Surgeon: Fabio Pierce, MD;  Location: [facility];  Service: Ophthalmology;  Laterality: Right;  CDE: 6.76   CATARACT EXTRACTION W/PHACO Left 06/01/2019   Procedure: CATARACT EXTRACTION PHACO AND INTRAOCULAR LENS PLACEMENT (IOC);  Surgeon: Fabio Pierce, MD;  Location: [facility];  Service: Ophthalmology;  Laterality: Left;  left, CDE: 8.69   CHOLECYSTECTOMY  2000    COLONOSCOPY  05/08/2009   SLF:A few scattered diverticula throughout the entire colon.  Small  internal hemorrhoids.  Otherwise, no polyps, masses, inflammatory changes, or AVMs seen.  Normal retroflexed view of the rectum   COLONOSCOPY N/A 06/03/2014   SLF:  1. six colon polyps removed. 2. Moderate sized internal hemorrhoids. 3. The left colon is redundant 4.  Moderate diverticulosis throughout the entire examed colon. next TCS in10 years with overtube.   COLONOSCOPY WITH PROPOFOL N/A 09/11/2021   Procedure: COLONOSCOPY WITH PROPOFOL;  Surgeon: Lanelle Bal, DO;  [facility]: [facility];  Service: Endoscopy;  Laterality: N/A;  10:45am   ESOPHAGOGASTRODUODENOSCOPY N/A 06/03/2014   SLF:  1. small hiatal hernia   ESOPHAGOGASTRODUODENOSCOPY N/A 02/16/2016   slf: gastritis, schazki ring   ESOPHAGOGASTRODUODENOSCOPY (EGD) WITH PROPOFOL N/A 09/11/2021  Procedure: ESOPHAGOGASTRODUODENOSCOPY (EGD) WITH PROPOFOL;  Surgeon: Lanelle Bal, DO;  [facility];  Service: Endoscopy;  Laterality: N/A;   GIVENS CAPSULE STUDY N/A 03/19/2016   Procedure: GIVENS CAPSULE STUDY;  Surgeon: West Bali, MD;  Location: [facility];  Service: Endoscopy;  Laterality: N/A;  0700   TOE ARTHROPLASTY Right 04/20/2023   Procedure: DEROTATIONAL ARHTHROPLASTY OF RIGHT 5TH TOE;  Surgeon: Erskine Emery, DPM;  Location: [facility];  Service: Podiatry;  Laterality: Right;   TOTAL HIP ARTHROPLASTY Left 04/30/2022   Procedure: LEFT TOTAL HIP ARTHROPLASTY ANTERIOR APPROACH;  Surgeon: Kathryne Hitch, MD;  Location: [facility];  Service: Orthopedics;  Laterality: Left;   VASCULAR SURGERY  1992   varicose veins    Family History  Problem Relation Age of Onset   Colon cancer Neg Hx    Liver disease Neg Hx     Allergies as of 06/20/2023 - Review Complete 06/20/2023  Allergen Reaction Noted   Anesthetics, ester Nausea And Vomiting 04/22/2022   Codeine Nausea And Vomiting 04/22/2014   Penicillins   11/30/2011    Social History   Socioeconomic History   Marital status: Widowed    Spouse name: Not on file   Number of children: Not on file   Years of education: Not on file   Highest education level: Not on file  Occupational History   Not on file  Tobacco Use   Smoking status: Never   Smokeless tobacco: Never   Tobacco comments:    Never smoked  Vaping Use   Vaping status: Never Used  Substance and Sexual Activity   Alcohol use: No    Alcohol/week: 0.0 standard drinks of alcohol   Drug use: No   Sexual activity: Not Currently  Other Topics Concern   Not on file  Social History Narrative   Not on file   Social Determinants of Health   Financial Resource Strain: Not on file  Food Insecurity: Not on file  Transportation Needs: Not on file  Physical Activity: Not on file  Stress: Not on file  Social Connections: Not on file     Review of Systems   Gen: Denies fever, chills, anorexia. Denies fatigue, weakness, weight loss.  CV: Denies chest pain, palpitations, syncope, peripheral edema, and claudication. Resp: Denies dyspnea at rest, cough, wheezing, coughing up blood, and pleurisy. GI: See HPI Derm: Denies rash, itching, dry skin Psych: Denies depression, anxiety, memory loss, confusion. No homicidal or suicidal ideation.  Heme: Denies bruising, bleeding, and enlarged lymph nodes.   Physical Exam   BP 138/76 (BP Location: Right Arm, Patient Position: Sitting, Cuff Size: Large)   Pulse 64   Temp 97.6 F (36.4 C) (Temporal)   Ht 5\' 11"  (1.803 m)   Wt 210 lb (95.3 kg)   SpO2 97%   BMI 29.29 kg/m   General:   Alert and oriented. No distress noted. Pleasant and cooperative.  Head:  Normocephalic and atraumatic. Eyes:  Conjuctiva clear without scleral icterus. Mouth:  Oral mucosa pink and moist. Good dentition. No lesions. Abdomen:  +BS, soft, non-distended.  Mild TTP to epigastrium.  No rebound or guarding. No HSM or masses noted. Rectal: deferred Msk:   Symmetrical without gross deformities. Normal posture. Extremities:  Without edema. Neurologic:  Alert and  oriented x4 Psych:  Alert and cooperative. Normal mood and affect.   Assessment  Jamie Hunt is a 72 y.o. female with a history of GERD, dysphagia, constipation, anemia, HTN, and HLD  presenting today for follow up.  GERD: Having taking omeprazole once daily and taking famotidine in the evenings prior to bed.  Continues to have almost daily reflux symptoms, primarily in the evenings.  Denies any significant nausea or vomiting or dysphagia.  Has been trying to avoid her food triggers.  Does have some epigastric tenderness on exam today and gets a nauseous feeling with palpation.  Denies any significant nausea or vomiting.  Will go back to twice daily dosing of omeprazole with famotidine as needed for breakthrough.  If she has ongoing symptoms with this then we will likely consider changing her PPI to see if this improves.  May consider Aciphex or Dexilant.  Constipation: Previously was going on every day with Metamucil however since her last visit she has been going 2 to 4 days without a bowel movement at times.  Sometimes spending 15 minutes on the commode at a time and having to strain.  Denies any significantly hard stools or difficulty with going.  Also having a little bit of lower abdominal discomfort and some gassiness.  She reports she has tried MiraLAX in the past without good results, unclear if she took it for an extended period of time.  Since her last visit she has had foot surgery and was briefly taking some narcotics which likely exacerbated her constipation however despite no longer using opioids she continues to have some constipation.  Will trial Trulance 3 mg daily, will provide some samples today and sent prescription just in case prior authorization needed.  Advised her to not pick up prescription until she has tried samples.  If alternative is needed or Trulance not helpful  will likely try Amitiza.  Advised her continue her Metamucil daily and high-fiber diet.  May use Gas-X for bloating and gassiness.  No significant abdominal distention on exam today  PLAN   Omeprazole 40 mg - increase to BID Continue famotidine as needed for breakthrough Trulance 3 mg daily, will provide samples if available. High fiber diet - continue metamucil daily. Gas-X as needed Follow up in 3 months    Brooke Bonito, MSN, FNP-BC, AGACNP-BC [facility]

## 2023-06-20 NOTE — Patient Instructions (Signed)
I have increased your omeprazole to twice daily.  You may begin this this evening.  Take 30 minutes prior to breakfast and lunch.  You may continue to use famotidine/Pepcid as needed for breakthrough symptoms.  Continue using Gas-X as needed for bloating/gassiness.  The extra fiber in your diet as well as Metamucil is probably contributing to this as well as your increase in constipation.  I am providing you some samples of Trulance today.  Take 1 tablet daily with or without food.  I have sent in prescription, if this is not covered by insurance we will likely try an alternative.  Do not pick up prescription until after you have tried your Trulance samples.  I will see you for follow-up in 3 months, sooner if needed.  It was a pleasure to see you today. I want to create trusting relationships with patients. If you receive a survey regarding your visit,  I greatly appreciate you taking time to fill this out on paper or through your MyChart. I value your feedback.  Brooke Bonito, MSN, FNP-BC, AGACNP-BC [facility]

## 2023-06-22 ENCOUNTER — Telehealth: Payer: Self-pay | Admitting: *Deleted

## 2023-06-22 ENCOUNTER — Other Ambulatory Visit: Payer: Self-pay | Admitting: Gastroenterology

## 2023-06-22 DIAGNOSIS — K5904 Chronic idiopathic constipation: Secondary | ICD-10-CM

## 2023-06-22 MED ORDER — LINACLOTIDE 145 MCG PO CAPS
145.0000 ug | ORAL_CAPSULE | Freq: Every day | ORAL | 1 refills | Status: DC
Start: 2023-06-22 — End: 2023-08-15

## 2023-06-22 NOTE — Telephone Encounter (Signed)
Received denial letter for Trulance. Pt needs to try and fail Linzess, Motegrity, or Lubiprostone.

## 2023-06-22 NOTE — Telephone Encounter (Signed)
Routing to Brooke Bonito, NP who saw patient last.

## 2023-06-23 NOTE — Telephone Encounter (Signed)
Noted. Informed pt.  

## 2023-07-12 DIAGNOSIS — K219 Gastro-esophageal reflux disease without esophagitis: Secondary | ICD-10-CM | POA: Diagnosis not present

## 2023-07-12 DIAGNOSIS — I10 Essential (primary) hypertension: Secondary | ICD-10-CM | POA: Diagnosis not present

## 2023-08-14 ENCOUNTER — Other Ambulatory Visit: Payer: Self-pay | Admitting: Gastroenterology

## 2023-08-14 DIAGNOSIS — K5904 Chronic idiopathic constipation: Secondary | ICD-10-CM

## 2023-08-16 DIAGNOSIS — I10 Essential (primary) hypertension: Secondary | ICD-10-CM | POA: Diagnosis not present

## 2023-08-16 DIAGNOSIS — E785 Hyperlipidemia, unspecified: Secondary | ICD-10-CM | POA: Diagnosis not present

## 2023-08-16 DIAGNOSIS — B029 Zoster without complications: Secondary | ICD-10-CM | POA: Diagnosis not present

## 2023-08-16 DIAGNOSIS — F3340 Major depressive disorder, recurrent, in remission, unspecified: Secondary | ICD-10-CM | POA: Diagnosis not present

## 2023-09-09 ENCOUNTER — Other Ambulatory Visit: Payer: Self-pay | Admitting: Gastroenterology

## 2023-09-09 DIAGNOSIS — K5904 Chronic idiopathic constipation: Secondary | ICD-10-CM

## 2023-09-15 DIAGNOSIS — K219 Gastro-esophageal reflux disease without esophagitis: Secondary | ICD-10-CM | POA: Diagnosis not present

## 2023-09-15 DIAGNOSIS — I10 Essential (primary) hypertension: Secondary | ICD-10-CM | POA: Diagnosis not present

## 2023-09-19 ENCOUNTER — Other Ambulatory Visit (INDEPENDENT_AMBULATORY_CARE_PROVIDER_SITE_OTHER): Payer: Self-pay

## 2023-09-19 ENCOUNTER — Ambulatory Visit: Payer: PPO | Admitting: Orthopaedic Surgery

## 2023-09-19 ENCOUNTER — Telehealth: Payer: Self-pay

## 2023-09-19 DIAGNOSIS — M1711 Unilateral primary osteoarthritis, right knee: Secondary | ICD-10-CM | POA: Insufficient documentation

## 2023-09-19 DIAGNOSIS — Z96642 Presence of left artificial hip joint: Secondary | ICD-10-CM | POA: Diagnosis not present

## 2023-09-19 DIAGNOSIS — M25561 Pain in right knee: Secondary | ICD-10-CM

## 2023-09-19 MED ORDER — LIDOCAINE HCL 1 % IJ SOLN
3.0000 mL | INTRAMUSCULAR | Status: AC | PRN
  Administered 2023-09-19: 3 mL

## 2023-09-19 MED ORDER — METHYLPREDNISOLONE ACETATE 40 MG/ML IJ SUSP
40.0000 mg | INTRAMUSCULAR | Status: AC | PRN
  Administered 2023-09-19: 40 mg via INTRA_ARTICULAR

## 2023-09-19 NOTE — Telephone Encounter (Signed)
Right knee gel injection  

## 2023-09-19 NOTE — Telephone Encounter (Signed)
Gel injection will submitted after the new year.

## 2023-09-19 NOTE — Progress Notes (Signed)
The patient is well-known to [place].  We replaced her left hip and July 2023.  She is 72 years old.  She still has been having some occasional left hip pain and the treadmill seems to her at times but I think a lot of this are related to her right knee arthritis.  She has been having a lot of knee pain recently on that right knee.  Her left operative hip moves smoothly and fluidly with no blocks or rotation and no pain.  Her right hip also moves smoothly.  Her right knee has significant patellofemoral crepitation.  There is moderate effusion and pain throughout the arc of motion of the right knee.  X-rays of the hip and pelvis on the left side show well-seated total hip arthroplasty with no complicating features.  The right hip joint space is well-maintained.  X-rays of her right knee shows significant tricompartment arthritis with with large osteophytes in the posteriorly and the patellofemoral joint and likely loose bodies as well.  There is severe patellofemoral narrowing and moderate medial and lateral narrowing.  She does have significant osteoarthritis of her right knee and I think that that creates some of the pain with her left hip.  I did recommend a steroid injection in her right knee today.  She has had these in the past and I think she is a more appropriate candidate for hyaluronic acid for her right knee which we will see if we get approved and ordered for her.  She agrees with the treatment plan.  This patient is diagnosed with osteoarthritis of the knee(s).    Radiographs show evidence of joint space narrowing, osteophytes, subchondral sclerosis and/or subchondral cysts.  This patient has knee pain which interferes with functional and activities of daily living.    This patient has experienced inadequate response, adverse effects and/or intolerance with conservative treatments such as acetaminophen, NSAIDS, topical creams, physical therapy or regular exercise, knee bracing and/or weight loss.    This patient has experienced inadequate response or has a contraindication to intra articular steroid injections for at least 3 months.   This patient is not scheduled to have a total knee replacement within 6 months of starting treatment with viscosupplementation.      Procedure Note  Patient: Jamie Hunt             Date of Birth: 07-31-1951           MRN: [id number]             Visit Date: 09/19/2023  Procedures: Visit Diagnoses:  1. History of left hip replacement   2. Acute pain of right knee   3. Unilateral primary osteoarthritis, right knee     Large Joint Inj: R knee on 09/19/2023 11:19 AM Indications: diagnostic evaluation and pain Details: 22 G 1.5 in needle, superolateral approach  Arthrogram: No  Medications: 3 mL lidocaine 1 %; 40 mg methylPREDNISolone acetate 40 MG/ML Outcome: tolerated well, no immediate complications Procedure, treatment alternatives, risks and benefits explained, specific risks discussed. Consent was given by the patient. Immediately prior to procedure a time out was called to verify the correct patient, procedure, equipment, support staff and site/side marked as required. Patient was prepped and draped in the usual sterile fashion.

## 2023-09-20 ENCOUNTER — Ambulatory Visit: Payer: PPO | Admitting: Nurse Practitioner

## 2023-09-25 NOTE — Progress Notes (Unsigned)
GI Office Note    Referring Provider: Benetta Spar* Primary Care Physician:  Benetta Spar, MD Primary Gastroenterologist: Hennie Duos. Marletta Lor, DO  Date:  09/26/2023  ID:  Kinisha, Walch 11-Oct-1951, MRN [id number]   Chief Complaint   Chief Complaint  Patient presents with   Follow-up    Pt arrives for follow up. Constipation is better since taking Linzess. Acid reflux is better.    History of Present Illness  Kymoni Rexene Edison Rosene is a 72 y.o. female with a history of GERD, dysphagia, constipation, anemia, HTN, and HLD presenting today for follow up of constipation.   EGD December 2022: mild schatzki ring s/p dilation, gastritis with reactive gastropathy, normal duodenum. No H. Pylori   Colonoscopy December 2022: non-bleeding internal hemorrhoids, transverse and ascending diverticulosis, two 7-9 mm polyps in the ascending and cecum with biopsy revealing tubular adenomas. Due for repeat colonoscopy in 2027.   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   OV 08/09/22. Reflux with raw vegetables, fried foods, salads and sweets. Issues also with tomato products. Recently increased omeprazole to 20 mg twice daily on most days. Rare nausea. Taking fiber supplements and stool softener daily. Stools daily or every other day. Feels better if doesn't overeat. Advised to increase omeprazole to 40 mg BID. GERD diet/lifestyle modifications reinforced. Trial wean of stool softener given controlled Bms. F/u in 4 months.    OV 11/08/22. Having a daily bowel movement for the most part, occasionally skipping a day.  Had recently stop stool softeners.  Taking Beano for gas about twice per day.  Takes 2 spoonfuls of fiber every morning.  Denied any melena or BRBPR.  Taking omeprazole twice daily and still having some occasional reflux with raw vegetables.  Denied any nausea, vomiting, or dysphagia.  Advised to continue daily fiber supplement.   Continue omeprazole 40 mg daily and take Pepcid twice daily as needed for breakthrough.  Continue Gas-X.  Last office visit 06/20/23.Straining and BM every 2-3 days. Tried miralax once daily in the past but only for couple of weeks. Taking metamucil.Taking omeprazole once daily and pepcid in the evening. Worsened symptoms in the evening. Trying to avoid triggers. Tries to eat before 5pm. Advised trulance trial, fiber, gas ex and increase PPI to BID.   Trulance was not covered therefore Linzess sent in.   Today:  No melena or bbrpr. No abdominal pain with bowel movements. Has a good BM in the mornings after breakfast and her linzess. Has some gas pains and then she has to go. She tries to go when she gets the urge that way she doesn't have difficulty.   Denise N/V, dysphagia. Reflux has been better with twice daily dosing of medication. Takes pepcid every now and again either in between or before bed. It depends on what she eats if she needs the pepcid. Spicy, cabbage, slaw, broccoli, raw veggies, and fried foods trigger her reflux.  Wt Readings from Last 3 Encounters:  09/26/23 208 lb 12.8 oz (94.7 kg)  06/20/23 210 lb (95.3 kg)  04/18/23 218 lb 7.6 oz (99.1 kg)    Current Outpatient Medications  Medication Sig Dispense Refill   acetaminophen (TYLENOL) 650 MG CR tablet Take 1,300 mg by mouth in the morning and at bedtime.     aspirin 81 MG chewable tablet Chew 1 tablet (81 mg total) by mouth 2 (two) times daily. (Patient taking differently: Chew 81 mg by mouth daily.) 30 tablet 0  Calcium Carb-Cholecalciferol (CALCIUM 500 + D PO) Take 1 tablet by mouth daily.     cholecalciferol (VITAMIN D3) 25 MCG (1000 UT) tablet Take 1,000 Units by mouth in the morning.     escitalopram (LEXAPRO) 10 MG tablet Take 10 mg by mouth at bedtime.     famotidine (PEPCID) 20 MG tablet Take 20 mg by mouth every evening.     hydrALAZINE (APRESOLINE) 50 MG tablet Take 1 tablet (50 mg total) by mouth 3 (three) times  daily. 270 tablet 3   hydrochlorothiazide (HYDRODIURIL) 25 MG tablet Take 25 mg by mouth in the morning.     LINZESS 145 MCG CAPS capsule TAKE 1 CAPSULE BY MOUTH ONCE DAILY BEFORE BREAKFAST 30 capsule 0   losartan (COZAAR) 100 MG tablet Take 100 mg by mouth in the morning.     Menthol, Topical Analgesic, (BIOFREEZE) 10 % CREA Apply 1 Application topically daily as needed (pain). Sometimes use roll-on     montelukast (SINGULAIR) 10 MG tablet Take 10 mg by mouth at bedtime.     Multiple Vitamin (MULTIVITAMIN WITH MINERALS) TABS tablet Take 1 tablet by mouth in the morning.     omeprazole (PRILOSEC) 40 MG capsule Take 1 capsule (40 mg total) by mouth 2 (two) times daily before a meal. 60 capsule 3   Polyethyl Glycol-Propyl Glycol (SYSTANE OP) Place 1 drop into both eyes 2 (two) times daily.     Potassium 99 MG TABS Take 99 mg by mouth in the morning.     traZODone (DESYREL) 50 MG tablet Take 50 mg by mouth at bedtime.     verapamil (CALAN-SR) 240 MG CR tablet Take 240 mg by mouth at bedtime.     rosuvastatin (CRESTOR) 40 MG tablet Take 1 tablet (40 mg total) by mouth daily. (Patient taking differently: Take 40 mg by mouth every evening.) 90 tablet 3   No current facility-administered medications for this visit.    Past Medical History:  Diagnosis Date   Acid reflux    Anemia    Arthritis    High cholesterol    Hypertension    LBBB (left bundle branch block)    PONV (postoperative nausea and vomiting)    Shingles     Past Surgical History:  Procedure Laterality Date   ABDOMINAL HYSTERECTOMY  1995   BACK SURGERY  2014   L4-L5   BALLOON DILATION N/A 09/11/2021   Procedure: BALLOON DILATION;  Surgeon: Lanelle Bal, DO;  [facility];  Service: Endoscopy;  Laterality: N/A;   BIOPSY  09/11/2021   Procedure: BIOPSY;  Surgeon: Lanelle Bal, DO;  [facility];  Service: Endoscopy;;   CATARACT EXTRACTION W/PHACO Right 05/18/2019   Procedure: CATARACT  EXTRACTION PHACO AND INTRAOCULAR LENS PLACEMENT (IOC);  Surgeon: Fabio Pierce, MD;  Location: [facility];  Service: Ophthalmology;  Laterality: Right;  CDE: 6.76   CATARACT EXTRACTION W/PHACO Left 06/01/2019   Procedure: CATARACT EXTRACTION PHACO AND INTRAOCULAR LENS PLACEMENT (IOC);  Surgeon: Fabio Pierce, MD;  Location: [facility];  Service: Ophthalmology;  Laterality: Left;  left, CDE: 8.69   CHOLECYSTECTOMY  2000   COLONOSCOPY  05/08/2009   SLF:A few scattered diverticula throughout the entire colon.  Small  internal hemorrhoids.  Otherwise, no polyps, masses, inflammatory changes, or AVMs seen.  Normal retroflexed view of the rectum   COLONOSCOPY N/A 06/03/2014   SLF:  1. six colon polyps removed. 2. Moderate sized internal hemorrhoids. 3. The left colon is redundant 4.  Moderate diverticulosis throughout the entire examed colon. next TCS in10 years with overtube.   COLONOSCOPY WITH PROPOFOL N/A 09/11/2021   Procedure: COLONOSCOPY WITH PROPOFOL;  Surgeon: Lanelle Bal, DO;  [facility];  Service: Endoscopy;  Laterality: N/A;  10:45am   ESOPHAGOGASTRODUODENOSCOPY N/A 06/03/2014   SLF:  1. small hiatal hernia   ESOPHAGOGASTRODUODENOSCOPY N/A 02/16/2016   slf: gastritis, schazki ring   ESOPHAGOGASTRODUODENOSCOPY (EGD) WITH PROPOFOL N/A 09/11/2021   Procedure: ESOPHAGOGASTRODUODENOSCOPY (EGD) WITH PROPOFOL;  Surgeon: Lanelle Bal, DO;  [facility];  Service: Endoscopy;  Laterality: N/A;   GIVENS CAPSULE STUDY N/A 03/19/2016   Procedure: GIVENS CAPSULE STUDY;  Surgeon: West Bali, MD;  [facility]: [facility];  Service: Endoscopy;  Laterality: N/A;  0700   TOE ARTHROPLASTY Right 04/20/2023   Procedure: DEROTATIONAL ARHTHROPLASTY OF RIGHT 5TH TOE;  Surgeon: Erskine Emery, DPM;  Location: [facility];  Service: Podiatry;  Laterality: Right;   TOTAL HIP ARTHROPLASTY Left 04/30/2022   Procedure: LEFT TOTAL HIP ARTHROPLASTY ANTERIOR APPROACH;  Surgeon: Kathryne Hitch, MD;  Location: [facility];  Service: Orthopedics;  Laterality: Left;   VASCULAR SURGERY  1992   varicose veins    Family History  Problem Relation Age of Onset   Colon cancer Neg Hx    Liver disease Neg Hx     Allergies as of 09/26/2023 - Review Complete 09/26/2023  Allergen Reaction Noted   Anesthetics, ester Nausea And Vomiting 04/22/2022   Codeine Nausea And Vomiting 04/22/2014   Penicillins  11/30/2011    Social History   Socioeconomic History   Marital status: Widowed    Spouse name: Not on file   Number of children: Not on file   Years of education: Not on file   Highest education level: Not on file  Occupational History   Not on file  Tobacco Use   Smoking status: Never   Smokeless tobacco: Never   Tobacco comments:    Never smoked  Vaping Use   Vaping status: Never Used  Substance and Sexual Activity   Alcohol use: No    Alcohol/week: 0.0 standard drinks of alcohol   Drug use: No   Sexual activity: Not Currently  Other Topics Concern   Not on file  Social History Narrative   Not on file   Social Drivers of Health   Financial Resource Strain: Not on file  Food Insecurity: Not on file  Transportation Needs: Not on file  Physical Activity: Not on file  Stress: Not on file  Social Connections: Not on file     Review of Systems   Gen: Denies fever, chills, anorexia. Denies fatigue, weakness, weight loss.  CV: Denies chest pain, palpitations, syncope, peripheral edema, and claudication. Resp: Denies dyspnea at rest, cough, wheezing, coughing up blood, and pleurisy. GI: See HPI Derm: Denies rash, itching, dry skin Psych: Denies depression, anxiety, memory loss, confusion. No homicidal or suicidal ideation.  Heme: Denies bruising, bleeding, and enlarged lymph nodes.  Physical Exam   BP 116/70   Pulse 70   Temp 97.6 F (36.4 C)   Ht 5\' 11"  (1.803 m)   Wt 208 lb 12.8 oz (94.7 kg)   BMI 29.12 kg/m   General:   Alert and oriented. No  distress noted. Pleasant and cooperative.  Head:  Normocephalic and atraumatic. Eyes:  Conjuctiva clear without scleral icterus. Mouth:  Oral mucosa pink and moist. Good dentition. No lesions. Abdomen:  +BS, soft,  non-distended. Mild ttp  to epigastrium. No rebound or guarding. No HSM or masses noted. Rectal: deferred Msk:  Symmetrical without gross deformities. Normal posture. Extremities:  Without edema. Neurologic:  Alert and  oriented x4 Psych:  Alert and cooperative. Normal mood and affect.  Assessment  Portia CHRYEL STINSON is a 72 y.o. female with a history of GERD, dysphagia, constipation, anemia, HTN, and HLD presenting today for follow up of constipation.   Chronic constipation: Currently well-controlled with Metamucil as well as Linzess 145 mcg daily.  She does occasionally have some looser stools with this but she is okay with her current bowel habits.  Will send in refill today.  We did discuss decreasing her dose to 72 mcg daily if she would like, she currently would like to keep her current dose.  At any point if she decides this is too much we can trial 72 mcg and provide her some samples.  Not having any abdominal pain or need to strain.  I also did advise her to continue fiber supplementation daily.  GERD: Symptoms controlled with omeprazole 40 mg twice daily.  Does occasionally use famotidine for breakthrough but this has not been as often.  PLAN   Linzess 145 mcg daily. Can trial Linzess 72 mcg daily if she would like in the future.  Continue Omeprazole 40 mg  BID Pepcid 20 mg as needed.  Continue metamucil daily.  GERD diet Follow up 6 months.     Brooke Bonito, MSN, FNP-BC, AGACNP-BC [facility]

## 2023-09-26 ENCOUNTER — Encounter: Payer: Self-pay | Admitting: Gastroenterology

## 2023-09-26 ENCOUNTER — Ambulatory Visit (INDEPENDENT_AMBULATORY_CARE_PROVIDER_SITE_OTHER): Payer: PPO | Admitting: Gastroenterology

## 2023-09-26 DIAGNOSIS — K219 Gastro-esophageal reflux disease without esophagitis: Secondary | ICD-10-CM | POA: Diagnosis not present

## 2023-09-26 DIAGNOSIS — K5909 Other constipation: Secondary | ICD-10-CM | POA: Diagnosis not present

## 2023-09-26 DIAGNOSIS — K5904 Chronic idiopathic constipation: Secondary | ICD-10-CM

## 2023-09-26 MED ORDER — OMEPRAZOLE 40 MG PO CPDR
40.0000 mg | DELAYED_RELEASE_CAPSULE | Freq: Two times a day (BID) | ORAL | 3 refills | Status: DC
Start: 2023-09-26 — End: 2024-07-05

## 2023-09-26 MED ORDER — LINACLOTIDE 145 MCG PO CAPS: 145.0000 ug | ORAL_CAPSULE | Freq: Every day | ORAL | 3 refills | Status: DC

## 2023-09-26 NOTE — Patient Instructions (Addendum)
For constipation: Continue Linzess 145 mcg once daily at least 30 minutes prior to meals If he feels like the medication is too strong at any point, you can call the office and ask for some samples of Linzess 72 mcg. Continue taking Metamucil daily.  You can take this during the mL a day or at night, it does not have to be in the morning time.  For reflux: Continue omeprazole 40 mg twice daily You may continue to use Pepcid as needed for breakthrough. Follow a GERD diet:  Avoid fried, fatty, greasy, spicy, citrus foods. Avoid caffeine and carbonated beverages. Avoid chocolate. Try eating 4-6 small meals a day rather than 3 large meals. Do not eat within 3 hours of laying down. Prop head of bed up on wood or bricks to create a 6 inch incline.  We will plan to follow-up in 6 months, sooner if needed.  I hope you have a [place] Christmas and a happy new year!  It was a pleasure to see you today. I want to create trusting relationships with patients. If you receive a survey regarding your visit,  I greatly appreciate you taking time to fill this out on paper or through your MyChart. I value your feedback.  Brooke Bonito, MSN, FNP-BC, AGACNP-BC [facility]

## 2023-09-29 NOTE — Progress Notes (Signed)
Cardiology Office Note:  .   Date:  09/30/2023  ID:  Jamie Hunt, DOB [birth year], MRN [id number] PCP: Jamie Spar, MD  [facility] Providers Cardiologist:  Jamie Pyo, MD    Patient Profile: .      PMH First-degree AV block LBBB Hypertension Hyperlipidemia Aortic atherosclerosis  Referred to cardiology and seen on 02/10/2022 by Dr. Lynnette Hunt for preoperative cardiac evaluation for upcoming left hip arthroplasty.  EKG demonstrated new left bundle branch block compared to previous EKG in 2014.  Reported she is able to do some activities of daily living but using a cane and is very slow.  EKG at our office demonstrated sinus rhythm with first-degree AV block and left bundle branch block.  No prior CV testing.  CT of abdomen and pelvis in 2023 demonstrated aortic atherosclerosis without aneurysm.  She underwent echocardiogram 03/01/2022 which revealed normal LVEF 60 to 65%, G1 DD, normal RV, no significant valve disease.  Lexiscan Myoview 03/01/2022 revealed mild thinning inferior septal base consistent with probable diaphragmatic attenuation, no significant ischemia or scar, low risk study.  She was felt to be low risk for upcoming surgery.  Last cardiology clinic visit was 03/16/2023 with me.  She reported some higher BP readings recently.  She reported gradually feeling more fatigued over the previous 5 years since retirement.  She was walking on the treadmill 30 minutes 5 days a week with occasional dizziness but felt it was related to sinus issues.  No presyncope or syncope.  She had bilateral ankle edema but no shortness of breath, orthopnea, or PND.  She admitted to frequently eating fried food, not a very healthy diet.  Her BP was elevated in the office and we increased her hydralazine to 50 mg 3 times daily.  Encouraged low-sodium heart healthy diet.  LDL cholesterol was 105 on lovastatin.  We switched her to rosuvastatin 40 mg daily.  LDL improved to 79 on lipid panel  completed 06/10/2023.       History of Present Illness: .   Jamie Hunt is a very pleasant 72 y.o. female who is here today for 6 month follow-up. She reports  occasional chest pain and fatigue, particularly when climbing stairs. She denies any correlation between the chest pain and exercise, and often attributes it to indigestion. She also reports occasional palpitations, particularly when stressed or after eating certain foods. She gets short of breath with heavy exertion. No orthopnea, PND, presyncope, or syncope. Has occasional lower extremity edema. Admits to a poor diet, particularly high in fried foods and sweets. She walks on a treadmill most days for about 30 minutes at a pace of 1.6 mph. She denies any chest pain or palpitations during these exercise sessions. She is adopted but knows that her paternal grandmother had heart problems.   Discussed the use of AI scribe software for clinical note transcription with the patient, who gave verbal consent to proceed.   ROS: See HPI       Studies Reviewed: .        Risk Assessment/Calculations:             Physical Exam:   VS:  BP 118/72 (BP Location: Left Arm, Patient Position: Sitting, Cuff Size: Large)   Pulse 70   Resp 16   Ht 5\' 11"  (1.803 m)   Wt 207 lb (93.9 kg)   SpO2 97%   BMI 28.87 kg/m    Wt Readings from Last 3 Encounters:  09/30/23 207 lb (  93.9 kg)  09/26/23 208 lb 12.8 oz (94.7 kg)  06/20/23 210 lb (95.3 kg)    GEN: Well nourished, well developed in no acute distress NECK: No JVD; No carotid bruits CARDIAC: RRR, no murmurs, rubs, gallops RESPIRATORY:  Clear to auscultation without rales, wheezing or rhonchi  ABDOMEN: Soft, non-tender, non-distended EXTREMITIES:  No edema; No deformity     ASSESSMENT AND PLAN: .    Chest pain: Occasional chest discomfort. Having shortness of breath and fatigue, most noticeable with walking up stairs. Symptoms do not worsen with exertion.  Nuclear stress test 02/2022 was low  risk.She was adopted and does not know her parents medical history, but knows her paternal grandmother had heart problems.  Due to additional risk factors of age and hyperlipidemia, we will get coronary CTA to evaluate for ischemia.  Continue aspirin, rosuvastatin.  Hypertension: BP is well controlled.   Hyperlipidemia LDL goal < 70/Aortic atherosclerosis: Lipid panel completed 06/10/2023 with total cholesterol 138, HDL 44, LDL 79, and triglycerides 79.  Brief discussion of LDL goal 70 or lower.  We switched her from lovastatin to rosuvastatin 40 mg daily at last office visit.  Encouraged heart healthy mostly plant based diet avoiding saturated fat, processed foods, simple carbohydrates, and sugar along with aiming for at least 150 minutes of moderate intensity exercise each week.   LBBB: Known. Echo 03/01/2022 with normal LVEF. HR is stable. No presyncope or syncope. We are getting coronary CTA to evaluate for ischemia.        Dispo: 2-3 months with me  Signed, Jamie Bridegroom, NP-C

## 2023-09-30 ENCOUNTER — Ambulatory Visit: Payer: PPO | Attending: Nurse Practitioner | Admitting: Nurse Practitioner

## 2023-09-30 ENCOUNTER — Encounter: Payer: Self-pay | Admitting: Nurse Practitioner

## 2023-09-30 VITALS — BP 118/72 | HR 70 | Resp 16 | Ht 71.0 in | Wt 207.0 lb

## 2023-09-30 DIAGNOSIS — I10 Essential (primary) hypertension: Secondary | ICD-10-CM

## 2023-09-30 DIAGNOSIS — I447 Left bundle-branch block, unspecified: Secondary | ICD-10-CM

## 2023-09-30 DIAGNOSIS — E785 Hyperlipidemia, unspecified: Secondary | ICD-10-CM | POA: Diagnosis not present

## 2023-09-30 DIAGNOSIS — I700 Atherosclerosis of aorta: Secondary | ICD-10-CM | POA: Diagnosis not present

## 2023-09-30 DIAGNOSIS — R072 Precordial pain: Secondary | ICD-10-CM | POA: Diagnosis not present

## 2023-09-30 MED ORDER — METOPROLOL TARTRATE 50 MG PO TABS: ORAL_TABLET | ORAL | 0 refills | Status: DC

## 2023-09-30 NOTE — Patient Instructions (Addendum)
Medication Instructions:   Your physician recommends that you continue on your current medications as directed. Please refer to the Current Medication list given to you today.   *If you need a refill on your cardiac medications before your next appointment, please call your pharmacy*   Lab Work:  Your physician recommends that you return for lab work TODAY!!!! BMET   If you have labs (blood work) drawn today and your tests are completely normal, you will receive your results only by: MyChart Message (if you have MyChart) OR A paper copy in the mail If you have any lab test that is abnormal or we need to change your treatment, we will call you to review the results.   Testing/Procedures:    Your cardiac CT will be scheduled at one of the below locations:   [facility] [address] [place], [place] [address] [phone]    If scheduled at [facility], please arrive at the [facility] Entrance (Entrance C2) of [facility] 30 minutes prior to test start time. You can use the FREE valet parking offered at entrance C (encouraged to control the heart rate for the test)  Proceed to the [facility] Radiology Department (first floor) to check-in and test prep.  All radiology patients and guests should use entrance C2 at [facility], accessed from [facility], even though the hospital's physical address listed is [address].    Please follow these instructions carefully (unless otherwise directed):  An IV will be required for this test and Nitroglycerin will be given.     On the Night Before the Test: Be sure to Drink plenty of water. Do not consume any caffeinated/decaffeinated beverages or chocolate 12 hours prior to your test. Do not take any antihistamines 12 hours prior to your test.   On the Day of the Test: Drink plenty of water until 1 hour prior to the test. Do not eat any food 1 hour prior  to test. You may take your regular medications prior to the test.   Take metoprolol (Lopressor) ONE (1) TABLET BY MOUTH (50 MG) two hours prior to test.  If you take Hydrochlorothiazide please HOLD on the morning of the test.  FEMALES- please wear underwire-free bra if available, avoid dresses & tight clothing       After the Test: Drink plenty of water. After receiving IV contrast, you may experience a mild flushed feeling. This is normal. On occasion, you may experience a mild rash up to 24 hours after the test. This is not dangerous. If this occurs, you can take Benadryl 25 mg and increase your fluid intake. If you experience trouble breathing, this can be serious. If it is severe call 911 IMMEDIATELY. If it is mild, please call our office.  We will call to schedule your test 2-4 weeks out understanding that some insurance companies will need an authorization prior to the service being performed.   For more information and frequently asked questions, please visit our website : [email or web address]  For non-scheduling related questions, please contact the cardiac imaging nurse navigator should you have any questions/concerns: Cardiac Imaging Nurse Navigators Direct Office Dial: [phone]   For scheduling needs, including cancellations and rescheduling, please call [place], [phone].       Follow-Up: At [facility], you and your health needs are our priority.  As part of our continuing mission to provide you with exceptional heart care, we have created designated  Provider Care Teams.  These Care Teams include your primary Cardiologist (physician) and Advanced Practice Providers (APPs -  Physician Assistants and Nurse Practitioners) who all work together to provide you with the care you need, when you need it.  We recommend signing up for the patient portal called "MyChart".  Sign up information is provided on this After Visit Summary.  MyChart is used  to connect with patients for Virtual Visits (Telemedicine).  Patients are able to view lab/test results, encounter notes, upcoming appointments, etc.  Non-urgent messages can be sent to your provider as well.   To learn more about what you can do with MyChart, go to [email or web address].    Your next appointment:   2 month(s)  Provider:   Eligha Bridegroom, NP         Other Instructions  Tackling Obesity with Lifestyle Changes  Obesity- What is it? And What can we do about it?  Obesity is a chronic complex disease defined as excessive fat deposits that can have a negative effect on our health. It can lead to many other diseases including type 2 diabetes.  Weight gain occurs when the amount of energy (calories) we consume is greater than the amount we use.  When our energy output is greater than our energy input we lose weight. The basic concept is simple, but in reality, it's much more complicated.  Unfortunately, in some people, our bodies have many ways it can compensate when we try to eat less and move more which can prevent [place] from changing our weight. This can lead to some people having a much more difficult time losing weight even when they put healthy habits into practice. This can be frustrating. We want to focus on healthy habits, physical activity and how we feel, and less the number on the scale.  Food As Energy  Calories  Calories is just a unit of measurement for energy.  Counting calories is not required to lose weight but counting for a short period of time can:   help you learn good portion sizes   Learn what your true energy needs are.   Help you be more aware of your snacking or grazing habits  To help calculate how many calories you should be eating, the NIH has a great body weight planner calculator at BeverageBuggy.si  Types of Energy Expenditure  Basal Metabolic Rate (BMR) Energy that our bodies use to preform everyday tasks. More muscle mass  through resistance training can increase this a small amount  Thermic Effect of Food The amount of energy that it takes to breakdown the food we eat. This will be highest when we eat protein and fiber rich foods  Exercise Energy Expenditure The amount of energy used during formal exercise (walking, biking, weightlifting)  Non-exercise activity thermogenesis (NEAT) The amount of energy spent on activities that are not formal exercise (standing, fidgeting). Therefore, it is not only important to do formal exercise but also move around throughout the day.  Managing The Meal  Macro nutrients (carbohydrates, fats and protein, fiber, water)  Micronutrients (vitamins, minerals)  Dietary Fiber  Benefits Examples Cautions  Soluble fiber  Decreases cholesterol  improve blood sugar control,  Feeds our gut bacteria  Allows [place] to feel fuller for longer so we eat less  fruits  oats  barley  legumes  peas  Beans  vegetables (broccoli) and root vegetables (carrots) Add fiber into your diet slowly and be sure to drink at least 8 cups of water a  day. This will help limit gas, bloating, diarrhea, or constipation.  Insoluble Fiber  Improves digestive health by making stool easier to pass  Allows [place] to feel fuller for longer so we eat less  whole grains  nuts  seeds  skin of fruit  vegetables (green beans, zucchini, cauliflower)  Tricks to add more fiber to your diet   Add beans (pinto, kidney, lima, navy and garbanzo) to salads, ground meat or brown rice   Add nuts or seeds and or fresh/frozen fruit to yogurt, cottage cheese, salads or steel cut oats   Cut up vegetables and eat with hummus   Look for unsweetened whole grain cereals with at least 5g of fiber per serving   Switch to whole grain bread. Look for bread that has whole grain flour as the first ingredient and has more fiber than carbs if you were to multiple the fiber x 10.   Try bulgar, barely, quinoa, buckwheat, brown  rice wild rice instead of white rice   Keep frozen vegetables on hand to add to dishes or soups  Meal Planning:  Meal planning is the key to setting you up for success. Here are some examples of healthy meal options.  Breakfast  Option 1: Omelette with vegetables (1 egg, spinach, mushrooms, or other vegetable of your choice), 2 slices whole-grain toast, tip of thumb size butter or soft margarine,  cup low-fat milk or yogurt  Option 2: steel-cut rolled oats (? cup dry), 1 tbsp peanut butter added to cooked oats,  cup low-fat milk.  Option 3: 2 slices whole-grain or rye toast with avocado spread ( small avocado mased with herbs and pepper to taste), 1 poached egg or [place] up (cooked to your liking)  Option 4:  cup plain 0% [place] yogurt topped with  cup berries and  cup walnuts or almonds, 2 slices whole-grain or rye toast, tip of thumb size soft margarine/butter  Lunch:  Option 1: 2 cups red lentil soup, green salad with 1 tbsp homemade vinaigrette (extra virgin olive oil and vinegar of choice plus spices)  Option 2: 3 oz. roasted chicken, 2 slices whole-grain bread, 2 tsp mayonnaise, mustard, lettuce, tomato if desired, 1 fruit (example: medium-sized apple or small pear)  Option 3: 3 oz. tuna packed in water, 1 whole-wheat pita (6 inch), 2 tsp mayonnaise, lettuce, tomato, or other non-starchy vegetable of your choice, 1 fruit (example: medium-sized apple or small pear)  Option 4: 1 serving of garden veggie buddha bowl with lentils and tahini sauce and 1 cup berries topped with  cup plain 0% Greek yogurt  Dinner:  Option 1: 1 serving roasted cauliflower salad, 3-4 oz. grilled or baked pork loin chop, 1/2 cup mashed potato, or brown rice or quinoa  Option 2: 1 serving fish (baked, grilled or air fried), green salad, 1 tbsp homemade vinaigrette,  cup cooked couscous  Option 3: 1 cup cooked whole grained pasta (example: spaghetti, spirals, macaroni),  cup favorite pasta  sauce (preferably homemade), 3-4 oz. grilled or baked chicken, green salad, 1 tbsp homemade vinaigrette  Option 4: 1 serving oven roasted salmon,  cup mashed sweet potato or couscous or brown rice or quinoa, broccoli (steamed or roasted)  Healthy snacks:   Carrots or celery with 1 tbsp of hummus   1 medium-sized fruit (apple or orange)   1 cup plain 0% [place] yogurt with  cup berries   Half apple, sliced, with 1 tbsp (15 mL) peanut or almond butter  Dining out:  Eating away from home has become a part of many people's lifestyle. Making healthy choices when you are eating out is important too. Portion size is an important part of healthy choices. Most branded fast-food places provide calories, sodium, and fat content for their menu items. www.calorieking.com would be great resource to find nutrition facts for your favorite brands and fast-food restaurants. Company specific website can be Chief Technology Officer for nutrition information for their items. (e.g. www.mcdonalds.com or www.nutritionix.com/[place]/menu/premium)  Here are some tips to help you make wise food choices when you are dining out.  Chose more often Avoid  Beverages   Choose more often: Water, low fat milk  Sugar-free/diet drinks  Unsweet tea or coffee    Avoid: Milkshakes, fruit drinks, regular pop  Alcohol, specialty drinks (e.g. iced cappuccino)  Fast food  Choose more often:  Garden salad  Mini subs, pita sandwiches ect with extra vegetables  plain burgers, grilled chicken  Vegetarian or cheese pizza with whole-grain crust    Avoid: Burgers/sandwiches with bacon, cheese, and high-fat sauces  [place] fries, fried chicken, fried fish, poutine, hash browns  Pizza with processed meats  Starters   Choose more often: Raw vegetables, salads (garden, spinach, fruit)  clear or vegetable soups  Seafood cocktail  Whole-grain breads and rolls    Avoid: Salads with high-fat dressings or toppings  Creamy  soups  Wings, egg rolls  onion rings, nachos  White or garlic bread  Main courses Grains & Starches (amount equal to  of your plate)  Choose more often:  Oatmeal, high-fiber/lower-sugar cereals  Whole-grain breads, rice, pasta, barley, couscous  Sweet potatoes    Avoid: Sugary, low-fiber cereals  Large bagels, muffins, croissants, white bread  [place] fries, hash browns, fried rice   Meat and alternative (amount equal to  of your plate)  Choose more often:  Lean meats, poultry, fish, eggs, low-fat cheese  Tofu, vegetable protein Legumes (e.g. lentils, chickpeas, beans)    Avoid: High-salt and/or high-fat meats (e.g. ribs, wings, sausages, wieners, processed lunch meats, imposter meats)   Vegetables (amount equal to  of your plate)  Choose more often:  Salads ([place], garden, spinach), plain vegetables   Avoid:  Salads with creamy, high-fat dressings and   Vegetables on sandwiches ect toppings like bacon bits, croutons, cheese  Desserts  Choose more often:  Fresh fruit, frozen yogourt, skim milk latte    Avoid: Cakes, pies, pastries, ice cream, cheesecake       Mediterranean Diet  Why follow it? Research shows. Those who follow the Mediterranean diet have a reduced risk of heart disease  The diet is associated with a reduced incidence of Parkinson's and Alzheimer's diseases People following the diet may have longer life expectancies and lower rates of chronic diseases  The Dietary Guidelines for Americans recommends the Mediterranean diet as an eating plan to promote health and prevent disease  What Is the Mediterranean Diet?  Healthy eating plan based on typical foods and recipes of Mediterranean-style cooking The diet is primarily a plant based diet; these foods should make up a majority of meals   Starches - Plant based foods should make up a majority of meals - They are an important sources of vitamins, minerals, energy, antioxidants, and fiber - Choose  whole grains, foods high in fiber and minimally processed items  - Typical grain sources include wheat, oats, barley, corn, brown rice, bulgar, farro, millet, polenta, couscous  - Various types of beans include chickpeas, lentils, fava beans, black beans, white  beans   Fruits  Veggies - Large quantities of antioxidant rich fruits & veggies; 6 or more servings  - Vegetables can be eaten raw or lightly drizzled with oil and cooked  - Vegetables common to the traditional Mediterranean Diet include: artichokes, arugula, beets, broccoli, brussel sprouts, cabbage, carrots, celery, collard greens, cucumbers, eggplant, kale, leeks, lemons, lettuce, mushrooms, okra, onions, peas, peppers, potatoes, pumpkin, radishes, rutabaga, shallots, spinach, sweet potatoes, turnips, zucchini - Fruits common to the Mediterranean Diet include: apples, apricots, avocados, cherries, clementines, dates, figs, grapefruits, grapes, melons, nectarines, oranges, peaches, pears, pomegranates, strawberries, tangerines  Fats - Replace butter and margarine with healthy oils, such as olive oil, canola oil, and tahini  - Limit nuts to no more than a handful a day  - Nuts include walnuts, almonds, pecans, pistachios, pine nuts  - Limit or avoid candied, honey roasted or heavily salted nuts - Olives are central to the [employer] - can be eaten whole or used in a variety of dishes   Meats Protein - Limiting red meat: no more than a few times a month - When eating red meat: choose lean cuts and keep the portion to the size of deck of cards - Eggs: approx. 0 to 4 times a week  - Fish and lean poultry: at least 2 a week  - Healthy protein sources include, chicken, [place], lean beef, lamb - Increase intake of seafood such as tuna, salmon, trout, mackerel, shrimp, scallops - Avoid or limit high fat processed meats such as sausage and bacon  Dairy - Include moderate amounts of low fat dairy products  - Focus on healthy dairy such as  fat free yogurt, skim milk, low or reduced fat cheese - Limit dairy products higher in fat such as whole or 2% milk, cheese, ice cream  Alcohol - Moderate amounts of red wine is ok  - No more than 5 oz daily for women (all ages) and men older than age 79  - No more than 10 oz of wine daily for men younger than 65  Other - Limit sweets and other desserts  - Use herbs and spices instead of salt to flavor foods  - Herbs and spices common to the traditional Mediterranean Diet include: basil, bay leaves, chives, cloves, cumin, fennel, garlic, lavender, marjoram, mint, oregano, parsley, pepper, rosemary, sage, savory, sumac, tarragon, thyme   It's not just a diet, it's a lifestyle:  The Mediterranean diet includes lifestyle factors typical of those in the region  Foods, drinks and meals are best eaten with others and savored Daily physical activity is important for overall good health This could be strenuous exercise like running and aerobics This could also be more leisurely activities such as walking, housework, yard-work, or taking the stairs Moderation is the key; a balanced and healthy diet accommodates most foods and drinks Consider portion sizes and frequency of consumption of certain foods   Meal Ideas & Options:  Breakfast:  Whole wheat toast or whole wheat English muffins with peanut butter & hard boiled egg Steel cut oats topped with apples & cinnamon and skim milk  Fresh fruit: banana, strawberries, melon, berries, peaches  Smoothies: strawberries, bananas, greek yogurt, peanut butter Low fat greek yogurt with blueberries and granola  Egg white omelet with spinach and mushrooms Breakfast couscous: whole wheat couscous, apricots, skim milk, cranberries  Sandwiches:  Hummus and grilled vegetables (peppers, zucchini, squash) on whole wheat bread   Grilled chicken on whole wheat pita with lettuce, tomatoes, cucumbers  or tzatziki  [employer] on whole wheat bread: tuna salad made with  greek yogurt, olives, red peppers, capers, green onions Garlic rosemary lamb pita: lamb sauted with garlic, rosemary, salt & pepper; add lettuce, cucumber, greek yogurt to pita - flavor with lemon juice and black pepper  Seafood:  Mediterranean grilled salmon, seasoned with garlic, basil, parsley, lemon juice and black pepper Shrimp, lemon, and spinach whole-grain pasta salad made with low fat greek yogurt  Seared scallops with lemon orzo  Seared tuna steaks seasoned salt, pepper, coriander topped with tomato mixture of olives, tomatoes, olive oil, minced garlic, parsley, green onions and cappers  Meats:  Herbed greek chicken salad with kalamata olives, cucumber, feta  Red bell peppers stuffed with spinach, bulgur, lean ground beef (or lentils) & topped with feta   Kebabs: skewers of chicken, tomatoes, onions, zucchini, squash  [place] burgers: made with red onions, mint, dill, lemon juice, feta cheese topped with roasted red peppers Vegetarian Cucumber salad: cucumbers, artichoke hearts, celery, red onion, feta cheese, tossed in olive oil & lemon juice  Hummus and whole grain pita points with a greek salad (lettuce, tomato, feta, olives, cucumbers, red onion) Lentil soup with celery, carrots made with vegetable broth, garlic, salt and pepper  Tabouli salad: parsley, bulgur, mint, scallions, cucumbers, tomato, radishes, lemon juice, olive oil, salt and pepper.

## 2023-10-01 LAB — BASIC METABOLIC PANEL
BUN/Creatinine Ratio: 12 (ref 12–28)
BUN: 12 mg/dL (ref 8–27)
CO2: 27 mmol/L (ref 20–29)
Calcium: 9.7 mg/dL (ref 8.7–10.3)
Chloride: 103 mmol/L (ref 96–106)
Creatinine, Ser: 1.03 mg/dL — ABNORMAL HIGH (ref 0.57–1.00)
Glucose: 82 mg/dL (ref 70–99)
Potassium: 4.4 mmol/L (ref 3.5–5.2)
Sodium: 141 mmol/L (ref 134–144)
eGFR: 58 mL/min/{1.73_m2} — ABNORMAL LOW (ref >59)

## 2023-10-11 ENCOUNTER — Ambulatory Visit (HOSPITAL_COMMUNITY)
Admission: RE | Admit: 2023-10-11 | Discharge: 2023-10-11 | Disposition: A | Discharge status: Home / Self Care | Payer: PPO | Source: Ambulatory Visit | Attending: Nurse Practitioner | Admitting: Nurse Practitioner

## 2023-10-11 DIAGNOSIS — R072 Precordial pain: Secondary | ICD-10-CM | POA: Diagnosis not present

## 2023-10-11 MED ORDER — NITROGLYCERIN 0.4 MG SL SUBL
SUBLINGUAL_TABLET | SUBLINGUAL | Status: AC
  Filled 2023-10-11: qty 2

## 2023-10-11 MED ORDER — IOHEXOL 350 MG/ML SOLN
95.0000 mL | Freq: Once | INTRAVENOUS | Status: AC | PRN
  Administered 2023-10-11: 95 mL via INTRAVENOUS

## 2023-10-11 MED ORDER — NITROGLYCERIN 0.4 MG SL SUBL
0.8000 mg | SUBLINGUAL_TABLET | Freq: Once | SUBLINGUAL | Status: AC
  Administered 2023-10-11: 0.8 mg via SUBLINGUAL

## 2023-10-12 ENCOUNTER — Encounter: Payer: Self-pay | Admitting: Emergency Medicine

## 2023-10-12 ENCOUNTER — Ambulatory Visit
Admission: EM | Admit: 2023-10-12 | Discharge: 2023-10-12 | Disposition: A | Discharge status: Home / Self Care | Payer: PPO | Attending: Family Medicine | Admitting: Family Medicine

## 2023-10-12 DIAGNOSIS — J069 Acute upper respiratory infection, unspecified: Secondary | ICD-10-CM

## 2023-10-12 MED ORDER — PROMETHAZINE-DM 6.25-15 MG/5ML PO SYRP: 5.0000 mL | ORAL_SOLUTION | Freq: Four times a day (QID) | ORAL | 0 refills | Status: DC | PRN

## 2023-10-12 MED ORDER — FLUTICASONE PROPIONATE 50 MCG/ACT NA SUSP: 1.0000 | Freq: Two times a day (BID) | NASAL | 2 refills | Status: DC

## 2023-10-12 MED ORDER — PREDNISONE 20 MG PO TABS: 40.0000 mg | ORAL_TABLET | Freq: Every day | ORAL | 0 refills | Status: DC

## 2023-10-12 NOTE — ED Triage Notes (Signed)
 Productive Cough and nasal congestion since Friday.

## 2023-10-12 NOTE — ED Provider Notes (Signed)
 [facility]    CSN: [id number] Arrival date & time: 10/12/23  1308      History   Chief Complaint No chief complaint on file.   HPI Jamie Hunt is a 73 y.o. female.   Presenting today with 5-day history of productive cough, chest tightness, congestion.  Denies fever, chills, chest pain, shortness of breath, abdominal pain, nausea vomiting or diarrhea.  So far trying Coricidin HBP with minimal relief.  No history of chronic pulmonary disease.    Past Medical History:  Diagnosis Date   Acid reflux    Anemia    Arthritis    High cholesterol    Hypertension    LBBB (left bundle branch block)    PONV (postoperative nausea and vomiting)    Shingles     Patient Active Problem List   Diagnosis Date Noted   Unilateral primary osteoarthritis, right knee 09/19/2023   Status post total replacement of left hip 04/30/2022   Unilateral primary osteoarthritis, left hip 11/23/2021   GERD (gastroesophageal reflux disease) 08/14/2021   H/O adenomatous polyp of colon 08/14/2021   Dysphagia 08/14/2021   Gastritis and gastroduodenitis 05/19/2016   Normocytic anemia 03/03/2016   Rectal bleeding 05/28/2014   Abdominal pain, bilateral upper quadrant 05/28/2014    Past Surgical History:  Procedure Laterality Date   ABDOMINAL HYSTERECTOMY  1995   BACK SURGERY  2014   L4-L5   BALLOON DILATION N/A 09/11/2021   Procedure: BALLOON DILATION;  Surgeon: Cindie Carlin POUR, DO;  [facility];  Service: Endoscopy;  Laterality: N/A;   BIOPSY  09/11/2021   Procedure: BIOPSY;  Surgeon: Cindie Carlin POUR, DO;  [facility]: [facility];  Service: Endoscopy;;   CATARACT EXTRACTION W/PHACO Right 05/18/2019   Procedure: CATARACT EXTRACTION PHACO AND INTRAOCULAR LENS PLACEMENT (IOC);  Surgeon: Harrie Agent, MD;  Location: [facility];  Service: Ophthalmology;  Laterality: Right;  CDE: 6.76   CATARACT EXTRACTION W/PHACO Left 06/01/2019   Procedure: CATARACT EXTRACTION PHACO AND  INTRAOCULAR LENS PLACEMENT (IOC);  Surgeon: Harrie Agent, MD;  Location: [facility];  Service: Ophthalmology;  Laterality: Left;  left, CDE: 8.69   CHOLECYSTECTOMY  2000   COLONOSCOPY  05/08/2009   SLF:A few scattered diverticula throughout the entire colon.  Small  internal hemorrhoids.  Otherwise, no polyps, masses, inflammatory changes, or AVMs seen.  Normal retroflexed view of the rectum   COLONOSCOPY N/A 06/03/2014   SLF:  1. six colon polyps removed. 2. Moderate sized internal hemorrhoids. 3. The left colon is redundant 4.  Moderate diverticulosis throughout the entire examed colon. next TCS in10 years with overtube.   COLONOSCOPY WITH PROPOFOL  N/A 09/11/2021   Procedure: COLONOSCOPY WITH PROPOFOL ;  Surgeon: Cindie Carlin POUR, DO;  [facility];  Service: Endoscopy;  Laterality: N/A;  10:45am   ESOPHAGOGASTRODUODENOSCOPY N/A 06/03/2014   SLF:  1. small hiatal hernia   ESOPHAGOGASTRODUODENOSCOPY N/A 02/16/2016   slf: gastritis, schazki ring   ESOPHAGOGASTRODUODENOSCOPY (EGD) WITH PROPOFOL  N/A 09/11/2021   Procedure: ESOPHAGOGASTRODUODENOSCOPY (EGD) WITH PROPOFOL ;  Surgeon: Cindie Carlin POUR, DO;  [facility];  Service: Endoscopy;  Laterality: N/A;   GIVENS CAPSULE STUDY N/A 03/19/2016   Procedure: GIVENS CAPSULE STUDY;  Surgeon: Margo LITTIE Haddock, MD;  [facility];  Service: Endoscopy;  Laterality: N/A;  0700   TOE ARTHROPLASTY Right 04/20/2023   Procedure: DEROTATIONAL ARHTHROPLASTY OF RIGHT 5TH TOE;  Surgeon: Tobie Buckles, DPM;  Location: [facility];  Service: Podiatry;  Laterality: Right;  TOTAL HIP ARTHROPLASTY Left 04/30/2022   Procedure: LEFT TOTAL HIP ARTHROPLASTY ANTERIOR APPROACH;  Surgeon: Vernetta Lonni GRADE, MD;  Location: [facility];  Service: Orthopedics;  Laterality: Left;   VASCULAR SURGERY  1992   varicose veins    OB History   No obstetric history on file.      Home Medications    Prior to Admission medications   Medication Sig  Start Date End Date Taking? Authorizing Provider  fluticasone  (FLONASE ) 50 MCG/ACT nasal spray Place 1 spray into both nostrils 2 (two) times daily. 10/12/23  Yes Stuart Vernell Norris, PA-C  predniSONE  (DELTASONE ) 20 MG tablet Take 2 tablets (40 mg total) by mouth daily with breakfast. 10/12/23  Yes Stuart Vernell Norris, PA-C  promethazine -dextromethorphan (PROMETHAZINE -DM) 6.25-15 MG/5ML syrup Take 5 mLs by mouth 4 (four) times daily as needed. 10/12/23  Yes Stuart Vernell Norris, PA-C  acetaminophen  (TYLENOL ) 650 MG CR tablet Take 1,300 mg by mouth in the morning and at bedtime.    [provider]  aspirin  81 MG chewable tablet Chew 1 tablet (81 mg total) by mouth 2 (two) times daily. Patient taking differently: Chew 81 mg by mouth daily. 05/01/22   Vernetta Lonni GRADE, MD  Calcium  Carb-Cholecalciferol  (CALCIUM  500 + D PO) Take 1 tablet by mouth daily.    [provider]  cholecalciferol  (VITAMIN D3) 25 MCG (1000 UT) tablet Take 1,000 Units by mouth in the morning.    [provider]  cycloSPORINE (RESTASIS) 0.05 % ophthalmic emulsion 1 drop 2 (two) times daily. 07/07/23   [provider]  escitalopram  (LEXAPRO ) 10 MG tablet Take 10 mg by mouth at bedtime. 06/21/21   [provider]  famotidine (PEPCID) 20 MG tablet Take 20 mg by mouth every evening.    [provider]  hydrALAZINE  (APRESOLINE ) 50 MG tablet Take 1 tablet (50 mg total) by mouth 3 (three) times daily. 03/16/23 03/15/24  Swinyer, Rosaline HERO, NP  hydrochlorothiazide  (HYDRODIURIL ) 25 MG tablet Take 25 mg by mouth in the morning.    [provider]  linaclotide  (LINZESS ) 145 MCG CAPS capsule Take 1 capsule (145 mcg total) by mouth daily before breakfast. 09/26/23   Kennedy Charmaine CROME, NP  losartan  (COZAAR ) 100 MG tablet Take 100 mg by mouth in the morning. 10/27/15   [provider]  Menthol , Topical Analgesic, (BIOFREEZE) 10 % CREA Apply 1 Application topically daily as  needed (pain). Sometimes use roll-on    [provider]  metoprolol  tartrate (LOPRESSOR ) 50 MG tablet Take one (1) tablet by mouth ( 50 mg) 2 hours prior to CT scan. 09/30/23   Swinyer, Rosaline HERO, NP  montelukast  (SINGULAIR ) 10 MG tablet Take 10 mg by mouth at bedtime.    [provider]  Multiple Vitamin (MULTIVITAMIN WITH MINERALS) TABS tablet Take 1 tablet by mouth in the morning.    [provider]  omeprazole  (PRILOSEC) 40 MG capsule Take 1 capsule (40 mg total) by mouth 2 (two) times daily before a meal. 09/26/23   Mahon, Charmaine CROME, NP  Polyethyl Glycol-Propyl Glycol (SYSTANE OP) Place 1 drop into both eyes 2 (two) times daily.    [provider]  Potassium 99 MG TABS Take 99 mg by mouth in the morning.    [provider]  rosuvastatin  (CRESTOR ) 40 MG tablet Take 1 tablet (40 mg total) by mouth daily. Patient taking differently: Take 40 mg by mouth every evening. 03/16/23 06/20/23  Swinyer, Rosaline HERO, NP  traZODone (DESYREL) 50 MG  tablet Take 50 mg by mouth at bedtime. 07/18/21   [provider]  verapamil  (CALAN -SR) 240 MG CR tablet Take 240 mg by mouth at bedtime. 02/12/16   [provider]    Family History Family History  Problem Relation Age of Onset   Heart disease Sister    Colon cancer Neg Hx    Liver disease Neg Hx     Social History Social History   Tobacco Use   Smoking status: Never   Smokeless tobacco: Never   Tobacco comments:    Never smoked  Vaping Use   Vaping status: Never Used  Substance Use Topics   Alcohol  use: No    Alcohol /week: 0.0 standard drinks of alcohol    Drug use: No     Allergies   Anesthetics, ester; Codeine; and Penicillins   Review of Systems Review of Systems Per HPI  Physical Exam Triage Vital Signs ED Triage Vitals  Encounter Vitals Group     BP 10/12/23 1317 (!) 142/82     Systolic BP Percentile --      Diastolic BP Percentile --      Pulse Rate 10/12/23 1317 94      Resp 10/12/23 1317 18     Temp 10/12/23 1317 97.9 F (36.6 C)     Temp Source 10/12/23 1317 Oral     SpO2 10/12/23 1317 98 %     Weight --      Height --      Head Circumference --      Peak Flow --      Pain Score 10/12/23 1318 0     Pain Loc --      Pain Education --      Exclude from Growth Chart --    No data found.  Updated Vital Signs BP (!) 142/82 (BP Location: Right Arm)   Pulse 94   Temp 97.9 F (36.6 C) (Oral)   Resp 18   SpO2 98%   Visual Acuity Right Eye Distance:   Left Eye Distance:   Bilateral Distance:    Right Eye Near:   Left Eye Near:    Bilateral Near:     Physical Exam Vitals and nursing note reviewed.  Constitutional:      Appearance: Normal appearance.  HENT:     Head: Atraumatic.     Right Ear: Tympanic membrane and external ear normal.     Left Ear: Tympanic membrane and external ear normal.     Nose: Rhinorrhea present.     Mouth/Throat:     Mouth: Mucous membranes are moist.     Pharynx: Posterior oropharyngeal erythema present.  Eyes:     Extraocular Movements: Extraocular movements intact.     Conjunctiva/sclera: Conjunctivae normal.  Cardiovascular:     Rate and Rhythm: Normal rate and regular rhythm.     Heart sounds: Normal heart sounds.  Pulmonary:     Effort: Pulmonary effort is normal.     Breath sounds: Normal breath sounds. No wheezing or rales.  Musculoskeletal:        General: Normal range of motion.     Cervical back: Normal range of motion and neck supple.  Skin:    General: Skin is warm and dry.  Neurological:     Mental Status: She is alert and oriented to person, place, and time.  Psychiatric:        Mood and Affect: Mood normal.        Thought Content: Thought content  normal.      UC Treatments / Results  Labs (all labs ordered are listed, but only abnormal results are displayed) Labs Reviewed - No data to display  EKG   Radiology CT CORONARY MORPH W/CTA COR W/SCORE W/CA W/CM &/OR  WO/CM Result Date: 10/12/2023 CLINICAL DATA:  40F wotj hyperlipidemia, left bundle-branch block, and chest pain. EXAM: Cardiac/Coronary  CT TECHNIQUE: The patient was scanned on a [employer]. FINDINGS: A 120 kV prospective scan was triggered in the descending thoracic aorta at 111 HU's. Axial non-contrast 3 mm slices were carried out through the heart. The data set was analyzed on a dedicated work station and scored using the Agatson method. Gantry rotation speed was 250 msecs and collimation was .6 mm. No beta blockade and 0.8 mg of sl NTG was given. The 3D data set was reconstructed in 5% intervals of the 67-82 % of the R-R cycle. Diastolic phases were analyzed on a dedicated work station using MPR, MIP and VRT modes. The patient received 80 cc of contrast. Aorta: Normal size.  No calcifications.  No dissection. Aortic Valve:  Trileaflet.  No calcifications. Coronary Arteries:  Normal coronary origin.  Right dominance. RCA is a large dominant artery that gives rise to PDA and PLVB. There is no plaque. Left main is a large artery that gives rise to LAD and LCX arteries. LAD is a large vessel that has minimal (25%) mixed plaque in the proximal and mid LAD. D1 is a small-vessel without significant disease. LCX is a non-dominant artery that gives rise to one large OM1 branch. There is minimal (< 25%) calcified plaque in the proximal left circumflex. Coronary Calcium  Score: Left main: 0 Left anterior descending artery: 68.4 Left circumflex artery: 28.7 Right coronary artery: 0 Total: 97.1 Percentile: 75th Other findings: Normal pulmonary vein drainage into the left atrium. Normal let atrial appendage without a thrombus. Normal size of the pulmonary artery. Non-cardiac: See separate report from [facility]. IMPRESSION: 1. Coronary calcium  score of 97.1. This was 75th percentile for age-, sex, and race-matched controls. 2. Total plaque volume 255 mm3 which is 52nd percentile for age- and sex-matched  controls (calcified plaque 19 mm3; non-calcified plaque 236 mm3). TPV is moderate. 3. Normal coronary origin with right dominance. 4. Normal coronary arteries. RECOMMENDATIONS: 1. CAD-RADS 0: No evidence of CAD (0%). Consider non-atherosclerotic causes of chest pain. 2. CAD-RADS 1: Minimal non-obstructive CAD (0-24%). Consider non-atherosclerotic causes of chest pain. Consider preventive therapy and risk factor modification. 3. CAD-RADS 2: Mild non-obstructive CAD (25-49%). Consider non-atherosclerotic causes of chest pain. Consider preventive therapy and risk factor modification. 4. CAD-RADS 3: Moderate stenosis. Consider symptom-guided anti-ischemic pharmacotherapy as well as risk factor modification per guideline directed care. Additional analysis with CT FFR will be submitted. 5. CAD-RADS 4: Severe stenosis. (70-99% or > 50% left main). Cardiac catheterization or CT FFR is recommended. Consider symptom-guided anti-ischemic pharmacotherapy as well as risk factor modification per guideline directed care. Invasive coronary angiography recommended with revascularization per published guideline statements. 6. CAD-RADS 5: Total coronary occlusion (100%). Consider cardiac catheterization or viability assessment. Consider symptom-guided anti-ischemic pharmacotherapy as well as risk factor modification per guideline directed care. 7. CAD-RADS N: Non-diagnostic study. Obstructive CAD can't be excluded. Alternative evaluation is recommended. Annabella Scarce, MD Electronically Signed   By: Annabella Scarce M.D.   On: 10/12/2023 12:39    Procedures Procedures (including critical care time)  Medications Ordered in UC Medications - No data to display  Initial Impression / Assessment and  Plan / UC Course  I have reviewed the triage vital signs and the nursing notes.  Pertinent labs & imaging results that were available during my care of the patient were reviewed by me and considered in my medical decision making  (see chart for details).     Will treat for viral bronchitis with prednisone , Flonase , Phenergan  DM, supportive over-the-counter medications and home care.  Return for worsening symptoms.  Final Clinical Impressions(s) / UC Diagnoses   Final diagnoses:  Viral URI with cough     Discharge Instructions      Continue coricidin HBP, plain mucinex, saline sinus rinses, vapor rubs and other home remedies in addition to the prescribed medications    ED Prescriptions     Medication Sig Dispense Auth. Provider   promethazine -dextromethorphan (PROMETHAZINE -DM) 6.25-15 MG/5ML syrup Take 5 mLs by mouth 4 (four) times daily as needed. 100 mL Stuart Vernell Norris, PA-C   fluticasone  (FLONASE ) 50 MCG/ACT nasal spray Place 1 spray into both nostrils 2 (two) times daily. 16 g Stuart Vernell Norris, PA-C   predniSONE  (DELTASONE ) 20 MG tablet Take 2 tablets (40 mg total) by mouth daily with breakfast. 10 tablet Stuart Vernell Norris, [place]      PDMP not reviewed this encounter.   Stuart Vernell Norris, [place] 10/12/23 [phone]

## 2023-10-12 NOTE — Discharge Instructions (Signed)
 Continue coricidin HBP, plain mucinex, saline sinus rinses, vapor rubs and other home remedies in addition to the prescribed medications

## 2023-10-13 ENCOUNTER — Other Ambulatory Visit: Payer: Self-pay | Admitting: *Deleted

## 2023-10-13 DIAGNOSIS — E785 Hyperlipidemia, unspecified: Secondary | ICD-10-CM

## 2023-10-16 DIAGNOSIS — K219 Gastro-esophageal reflux disease without esophagitis: Secondary | ICD-10-CM | POA: Diagnosis not present

## 2023-10-16 DIAGNOSIS — I10 Essential (primary) hypertension: Secondary | ICD-10-CM | POA: Diagnosis not present

## 2023-10-26 NOTE — Telephone Encounter (Signed)
 VOB submitted for Monovisc, right knee

## 2023-10-28 DIAGNOSIS — E785 Hyperlipidemia, unspecified: Secondary | ICD-10-CM | POA: Diagnosis not present

## 2023-10-29 LAB — HEPATIC FUNCTION PANEL
ALT: 18 [IU]/L (ref 0–32)
AST: 22 [IU]/L (ref 0–40)
Albumin: 4.3 g/dL (ref 3.8–4.8)
Alkaline Phosphatase: 62 [IU]/L (ref 44–121)
Bilirubin Total: 0.4 mg/dL (ref 0.0–1.2)
Bilirubin, Direct: 0.16 mg/dL (ref 0.00–0.40)
Total Protein: 7.1 g/dL (ref 6.0–8.5)

## 2023-10-29 LAB — LIPID PANEL
Chol/HDL Ratio: 3.2 {ratio} (ref 0.0–4.4)
Cholesterol, Total: 154 mg/dL (ref 100–199)
HDL: 48 mg/dL (ref >39)
LDL Chol Calc (NIH): 92 mg/dL (ref 0–99)
Triglycerides: 73 mg/dL (ref 0–149)
VLDL Cholesterol Cal: 14 mg/dL (ref 5–40)

## 2023-10-31 ENCOUNTER — Other Ambulatory Visit: Payer: Self-pay | Admitting: *Deleted

## 2023-10-31 MED ORDER — EZETIMIBE 10 MG PO TABS: 10.0000 mg | ORAL_TABLET | Freq: Every day | ORAL | 3 refills | Status: AC

## 2023-11-16 DIAGNOSIS — I10 Essential (primary) hypertension: Secondary | ICD-10-CM | POA: Diagnosis not present

## 2023-11-16 DIAGNOSIS — K219 Gastro-esophageal reflux disease without esophagitis: Secondary | ICD-10-CM | POA: Diagnosis not present

## 2023-12-08 ENCOUNTER — Ambulatory Visit: Payer: PPO | Attending: Nurse Practitioner | Admitting: Nurse Practitioner

## 2023-12-08 ENCOUNTER — Other Ambulatory Visit: Payer: Self-pay | Admitting: *Deleted

## 2023-12-08 ENCOUNTER — Encounter: Payer: Self-pay | Admitting: Nurse Practitioner

## 2023-12-08 VITALS — BP 130/68 | HR 65 | Ht 71.0 in | Wt 208.0 lb

## 2023-12-08 DIAGNOSIS — I700 Atherosclerosis of aorta: Secondary | ICD-10-CM

## 2023-12-08 DIAGNOSIS — E785 Hyperlipidemia, unspecified: Secondary | ICD-10-CM

## 2023-12-08 DIAGNOSIS — R072 Precordial pain: Secondary | ICD-10-CM

## 2023-12-08 DIAGNOSIS — I10 Essential (primary) hypertension: Secondary | ICD-10-CM

## 2023-12-08 DIAGNOSIS — I2510 Atherosclerotic heart disease of native coronary artery without angina pectoris: Secondary | ICD-10-CM | POA: Diagnosis not present

## 2023-12-08 DIAGNOSIS — I447 Left bundle-branch block, unspecified: Secondary | ICD-10-CM

## 2023-12-08 NOTE — Progress Notes (Signed)
 Cardiology Office Note:  .   Date:  12/08/2023  ID:  Jamie Hunt, DOB 14-May-1951, MRN [id number] PCP: Benetta Spar, MD  [facility] Providers Cardiologist:  Orbie Pyo, MD    Patient Profile: .      PMH First-degree AV block Coronary artery disease Coronary CTA  LBBB Hypertension Hyperlipidemia Aortic atherosclerosis  Referred to cardiology and seen on 02/10/2022 by Dr. Lynnette Caffey for preoperative cardiac evaluation for upcoming left hip arthroplasty.  EKG demonstrated new left bundle branch block compared to previous EKG in 2014.  Reported she is able to do some activities of daily living but using a cane and is very slow.  EKG at our office demonstrated sinus rhythm with first-degree AV block and left bundle branch block.  No prior CV testing.  CT of abdomen and pelvis in 2023 demonstrated aortic atherosclerosis without aneurysm.  She underwent echocardiogram 03/01/2022 which revealed normal LVEF 60 to 65%, G1 DD, normal RV, no significant valve disease.  Lexiscan Myoview 03/01/2022 revealed mild thinning inferior septal base consistent with probable diaphragmatic attenuation, no significant ischemia or scar, low risk study.  She was felt to be low risk for upcoming surgery.  Seen in clinic on 03/16/2023 by me. BP was elevated and she was gradually feeling more fatigued over the previous 5 years since retirement.  She was walking on the treadmill 30 minutes 5 days a week with occasional dizziness but felt it was related to sinus issues.  No presyncope or syncope.  Bilateral ankle edema but no shortness of breath, orthopnea, or PND.  She admitted to frequently eating fried food, not a very healthy diet.  Her BP was elevated in the office and we increased her hydralazine to 50 mg 3 times daily.  Encouraged low-sodium heart healthy diet.  LDL cholesterol was 105 on lovastatin.  We switched her to rosuvastatin 40 mg daily.  LDL improved to 79 on lipid panel completed  06/10/2023.  At follow-up visit 09/30/23, she reported occasional chest pain and fatigue particularly when climbing steps.  At home she continued to walk on the treadmill at a pace of 1.6 mph for about 30 minutes.  We pursued coronary CTA for ischemia evaluation which revealed CAC score of 97.1 (75th percentile), TPV moderate, no significant stenosis.  Repeat lipid panel 10/28/2023 with no improvement in LDL on rosuvastatin 40 mg daily.  LDL-C was 92, total cholesterol 154, triglycerides 73, and HDL 48.  She was advised to add Zetia 10 mg daily.       History of Present Illness: .   Jamie Hunt is a very pleasant 73 y.o. female who is here today for follow-up of chest pain. She is feeling well. Reports she continues to have some occasional chest discomfort that does not consistently occur with exercise or with rest. We reviewed her coronary CT results in detail and she is very pleased. She has been able to increase her treadmill speed from 6 to 8 and is incorporating two-pound weights into her exercise routine. She is making efforts to improve her diet, including increasing vegetable intake, limiting sweets and fried foods, and reducing consumption of pork and beef. She occasionally indulges in treats like Snickers bars and cake.  She denies shortness of breath, weight gain, orthopnea, PND, edema, presyncope, syncope.   Discussed the use of AI scribe software for clinical note transcription with the patient, who gave verbal consent to proceed.   ROS: See HPI  Studies Reviewed: .        Risk Assessment/Calculations:             Physical Exam:   VS:  BP 130/68 (BP Location: Left Arm)   Pulse 65   Ht 5\' 11"  (1.803 m)   Wt 208 lb (94.3 kg)   SpO2 95%   BMI 29.01 kg/m    Wt Readings from Last 3 Encounters:  12/08/23 208 lb (94.3 kg)  09/30/23 207 lb (93.9 kg)  09/26/23 208 lb 12.8 oz (94.7 kg)    GEN: Well nourished, well developed in no acute distress NECK: No JVD; No carotid  bruits CARDIAC: RRR, no murmurs, rubs, gallops RESPIRATORY:  Clear to auscultation without rales, wheezing or rhonchi  ABDOMEN: Soft, non-tender, non-distended EXTREMITIES:  No edema; No deformity     ASSESSMENT AND PLAN: .    CAD/Precordial chest pain: Mild nonobstructive CAD on coronary CTA 10/11/23.  She continues to have some nondescript chest pain that does not consistently occur with exertion.  She has recently increased the intensity of her treadmill walking and is tolerating this without concerning cardiac symptoms.  No indication for further ischemia evaluation at this time.  We will continue to focus on secondary prevention including heart healthy mostly plant based diet avoiding saturated fat, processed foods, simple carbohydrates, and sugar along with aiming for at least 150 minutes of moderate intensity exercise each week. We will recheck lipids in about 2 months.  No bleeding concerns.  Continue aspirin, ezetimibe, losartan, rosuvastatin, verapamil.  Hypertension: BP is well controlled.  Renal function stable on labs completed 09/30/23.  We will continue current antihypertensive therapy including verapamil, losartan, hydrochlorothiazide.  Hyperlipidemia LDL goal < 70/Aortic atherosclerosis: LDL was 105 May 2024 and she was switched from lovastatin to rosuvastatin 40 mg daily.  She had improvement in LDL to 76 but subsequently it was elevated to 92 on 10/28/23.  We added ezetimibe 10 mg daily.  We will recheck lipid panel in 2 months.  Continue to limit sugar, simple carbohydrates, and saturated fat. Continue regular exercise.   LBBB: Known. Normal LVEF on echo 02/2022. No significant CAD on coronary CT 09/2023. No indication for additional testing at this time.        Disposition: 1 year (she would like to transfer to [facility] office - will have her see Randall An, PA)  Signed, Eligha Bridegroom, NP-C

## 2023-12-08 NOTE — Patient Instructions (Signed)
 Medication Instructions:   Your physician recommends that you continue on your current medications as directed. Please refer to the Current Medication list given to you today.   *If you need a refill on your cardiac medications before your next appointment, please call your pharmacy*   Lab Work:  Your physician recommends that you return for a FASTING LIPID/ALT in April, fasting after midnight in [place] at [facility]. Paperwork given to pt today.    If you have labs (blood work) drawn today and your tests are completely normal, you will receive your results only by: MyChart Message (if you have MyChart) OR A paper copy in the mail If you have any lab test that is abnormal or we need to change your treatment, we will call you to review the results.   Testing/Procedures:  None ordered.   Follow-Up: At [facility], you and your health needs are our priority.  As part of our continuing mission to provide you with exceptional heart care, we have created designated Provider Care Teams.  These Care Teams include your primary Cardiologist (physician) and Advanced Practice Providers (APPs -  Physician Assistants and Nurse Practitioners) who all work together to provide you with the care you need, when you need it.  We recommend signing up for the patient portal called "MyChart".  Sign up information is provided on this After Visit Summary.  MyChart is used to connect with patients for Virtual Visits (Telemedicine).  Patients are able to view lab/test results, encounter notes, upcoming appointments, etc.  Non-urgent messages can be sent to your provider as well.   To learn more about what you can do with MyChart, go to [email or web address].    Your next appointment:   1 year(s)  Provider:   You will see one of the following Advanced Practice Providers on your designated Care Team:   Randall An, PA-C        Other Instructions  Your physician wants you to follow-up  in: 1 year.  You will receive a reminder letter in the mail two months in advance. If you don't receive a letter, please call our office to schedule the follow-up appointment.

## 2023-12-14 DIAGNOSIS — K219 Gastro-esophageal reflux disease without esophagitis: Secondary | ICD-10-CM | POA: Diagnosis not present

## 2023-12-14 DIAGNOSIS — I10 Essential (primary) hypertension: Secondary | ICD-10-CM | POA: Diagnosis not present

## 2024-01-10 ENCOUNTER — Other Ambulatory Visit: Payer: Self-pay

## 2024-01-10 ENCOUNTER — Ambulatory Visit
Admission: EM | Admit: 2024-01-10 | Discharge: 2024-01-10 | Disposition: A | Discharge status: Home / Self Care | Attending: Family Medicine | Admitting: Family Medicine

## 2024-01-10 ENCOUNTER — Encounter: Payer: Self-pay | Admitting: Emergency Medicine

## 2024-01-10 DIAGNOSIS — J101 Influenza due to other identified influenza virus with other respiratory manifestations: Secondary | ICD-10-CM

## 2024-01-10 LAB — POC COVID19/FLU A&B COMBO
Covid Antigen, POC: NEGATIVE
Influenza A Antigen, POC: POSITIVE — AB
Influenza B Antigen, POC: NEGATIVE

## 2024-01-10 MED ORDER — OSELTAMIVIR PHOSPHATE 75 MG PO CAPS: 75.0000 mg | ORAL_CAPSULE | Freq: Two times a day (BID) | ORAL | 0 refills | Status: DC

## 2024-01-10 MED ORDER — PROMETHAZINE-DM 6.25-15 MG/5ML PO SYRP
5.0000 mL | ORAL_SOLUTION | Freq: Four times a day (QID) | ORAL | 0 refills | Status: DC | PRN
Start: 2024-01-10 — End: 2024-04-17

## 2024-01-10 MED ORDER — FLUTICASONE PROPIONATE 50 MCG/ACT NA SUSP: 1.0000 | Freq: Two times a day (BID) | NASAL | 2 refills | Status: AC

## 2024-01-10 NOTE — ED Triage Notes (Signed)
 Pt reports cough, nasal congestion x2 days.reports was exposed to flu.

## 2024-01-10 NOTE — ED Provider Notes (Signed)
 [facility]    CSN: [id number] Arrival date & time: 01/10/24  1528      History   Chief Complaint Chief Complaint  Patient presents with   Cough    HPI Jamie Hunt is a 73 y.o. female.   Patient presenting today with 2-day history of cough, nasal congestion.  Denies fever, chills, body aches, chest pain, shortness of breath, abdominal pain, vomiting, diarrhea.  So far not trying anything over-the-counter for symptoms.  Recent exposure to influenza.    Past Medical History:  Diagnosis Date   Acid reflux    Anemia    Arthritis    High cholesterol    Hypertension    LBBB (left bundle branch block)    PONV (postoperative nausea and vomiting)    Shingles     Patient Active Problem List   Diagnosis Date Noted   Unilateral primary osteoarthritis, right knee 09/19/2023   Status post total replacement of left hip 04/30/2022   Unilateral primary osteoarthritis, left hip 11/23/2021   GERD (gastroesophageal reflux disease) 08/14/2021   H/O adenomatous polyp of colon 08/14/2021   Dysphagia 08/14/2021   Gastritis and gastroduodenitis 05/19/2016   Normocytic anemia 03/03/2016   Rectal bleeding 05/28/2014   Abdominal pain, bilateral upper quadrant 05/28/2014    Past Surgical History:  Procedure Laterality Date   ABDOMINAL HYSTERECTOMY  1995   BACK SURGERY  2014   L4-L5   BALLOON DILATION N/A 09/11/2021   Procedure: BALLOON DILATION;  Surgeon: Lanelle Bal, DO;  Location: [facility];  Service: Endoscopy;  Laterality: N/A;   BIOPSY  09/11/2021   Procedure: BIOPSY;  Surgeon: Lanelle Bal, DO;  Location: [facility];  Service: Endoscopy;;   CATARACT EXTRACTION W/PHACO Right 05/18/2019   Procedure: CATARACT EXTRACTION PHACO AND INTRAOCULAR LENS PLACEMENT (IOC);  Surgeon: Fabio Pierce, MD;  Location: [facility];  Service: Ophthalmology;  Laterality: Right;  CDE: 6.76   CATARACT EXTRACTION W/PHACO Left 06/01/2019   Procedure: CATARACT EXTRACTION PHACO  AND INTRAOCULAR LENS PLACEMENT (IOC);  Surgeon: Fabio Pierce, MD;  Location: [facility];  Service: Ophthalmology;  Laterality: Left;  left, CDE: 8.69   CHOLECYSTECTOMY  2000   COLONOSCOPY  05/08/2009   SLF:A few scattered diverticula throughout the entire colon.  Small  internal hemorrhoids.  Otherwise, no polyps, masses, inflammatory changes, or AVMs seen.  Normal retroflexed view of the rectum   COLONOSCOPY N/A 06/03/2014   SLF:  1. six colon polyps removed. 2. Moderate sized internal hemorrhoids. 3. The left colon is redundant 4.  Moderate diverticulosis throughout the entire examed colon. next TCS in10 years with overtube.   COLONOSCOPY WITH PROPOFOL N/A 09/11/2021   Procedure: COLONOSCOPY WITH PROPOFOL;  Surgeon: Lanelle Bal, DO;  Location: [facility];  Service: Endoscopy;  Laterality: N/A;  10:45am   ESOPHAGOGASTRODUODENOSCOPY N/A 06/03/2014   SLF:  1. small hiatal hernia   ESOPHAGOGASTRODUODENOSCOPY N/A 02/16/2016   slf: gastritis, schazki ring   ESOPHAGOGASTRODUODENOSCOPY (EGD) WITH PROPOFOL N/A 09/11/2021   Procedure: ESOPHAGOGASTRODUODENOSCOPY (EGD) WITH PROPOFOL;  Surgeon: Lanelle Bal, DO;  Location: [facility];  Service: Endoscopy;  Laterality: N/A;   GIVENS CAPSULE STUDY N/A 03/19/2016   Procedure: GIVENS CAPSULE STUDY;  Surgeon: West Bali, MD;  Location: [facility];  Service: Endoscopy;  Laterality: N/A;  0700   TOE ARTHROPLASTY Right 04/20/2023   Procedure: DEROTATIONAL ARHTHROPLASTY OF RIGHT 5TH TOE;  Surgeon: Erskine Emery, DPM;  Location: [facility];  Service: Podiatry;  Laterality:  Right;   TOTAL HIP ARTHROPLASTY Left 04/30/2022   Procedure: LEFT TOTAL HIP ARTHROPLASTY ANTERIOR APPROACH;  Surgeon: Kathryne Hitch, MD;  Location: [facility];  Service: Orthopedics;  Laterality: Left;   VASCULAR SURGERY  1992   varicose veins    OB History   No obstetric history on file.      Home Medications    Prior to Admission medications   Medication  Sig Start Date End Date Taking? Authorizing Provider  fluticasone (FLONASE) 50 MCG/ACT nasal spray Place 1 spray into both nostrils 2 (two) times daily. 01/10/24  Yes Particia Nearing, PA-C  oseltamivir (TAMIFLU) 75 MG capsule Take 1 capsule (75 mg total) by mouth every 12 (twelve) hours. 01/10/24  Yes Particia Nearing, PA-C  promethazine-dextromethorphan (PROMETHAZINE-DM) 6.25-15 MG/5ML syrup Take 5 mLs by mouth 4 (four) times daily as needed. 01/10/24  Yes Particia Nearing, PA-C  acetaminophen (TYLENOL) 650 MG CR tablet Take 1,300 mg by mouth in the morning and at bedtime.    [provider]  aspirin EC 81 MG tablet Take 81 mg by mouth daily. Swallow whole.    [provider]  Calcium Carb-Cholecalciferol (CALCIUM 500 + D PO) Take 1 tablet by mouth daily.    [provider]  cholecalciferol (VITAMIN D3) 25 MCG (1000 UT) tablet Take 1,000 Units by mouth in the morning.    [provider]  cycloSPORINE (RESTASIS) 0.05 % ophthalmic emulsion 1 drop 2 (two) times daily. 07/07/23   [provider]  escitalopram (LEXAPRO) 10 MG tablet Take 10 mg by mouth at bedtime. 06/21/21   [provider]  ezetimibe (ZETIA) 10 MG tablet Take 1 tablet (10 mg total) by mouth daily. 10/31/23 01/29/24  Swinyer, Zachary George, NP  famotidine (PEPCID) 20 MG tablet Take 20 mg by mouth every evening.    [provider]  fluticasone (FLONASE) 50 MCG/ACT nasal spray Place 1 spray into both nostrils 2 (two) times daily. 10/12/23   Particia Nearing, PA-C  hydrALAZINE (APRESOLINE) 50 MG tablet Take 1 tablet (50 mg total) by mouth 3 (three) times daily. 03/16/23 03/15/24  Swinyer, Zachary George, NP  hydrochlorothiazide (HYDRODIURIL) 25 MG tablet Take 25 mg by mouth in the morning.    [provider]  linaclotide Karlene Einstein) 145 MCG CAPS capsule Take 1 capsule (145 mcg total) by mouth daily before breakfast. 09/26/23   Aida Raider, NP  losartan (COZAAR)  100 MG tablet Take 100 mg by mouth in the morning. 10/27/15   [provider]  Menthol, Topical Analgesic, (BIOFREEZE) 10 % CREA Apply 1 Application topically daily as needed (pain). Sometimes use roll-on    [provider]  montelukast (SINGULAIR) 10 MG tablet Take 10 mg by mouth at bedtime.    [provider]  Multiple Vitamin (MULTIVITAMIN WITH MINERALS) TABS tablet Take 1 tablet by mouth in the morning.    [provider]  omeprazole (PRILOSEC) 40 MG capsule Take 1 capsule (40 mg total) by mouth 2 (two) times daily before a meal. 09/26/23   Mahon, Frederik Schmidt, NP  Polyethyl Glycol-Propyl Glycol (SYSTANE OP) Place 1 drop into both eyes 2 (two) times daily.    [provider]  Potassium 99 MG TABS Take 99 mg by mouth in the morning.    [provider]  rosuvastatin (CRESTOR) 40 MG tablet Take 1 tablet (40 mg total) by mouth daily. Patient taking differently: Take 40 mg by mouth every evening. 03/16/23 06/20/23  Swinyer, Marcelino Duster  M, NP  traZODone (DESYREL) 50 MG tablet Take 50 mg by mouth at bedtime. 07/18/21   [provider]  verapamil (CALAN-SR) 240 MG CR tablet Take 240 mg by mouth at bedtime. 02/12/16   [provider]    Family History Family History  Problem Relation Age of Onset   Heart disease Sister    Colon cancer Neg Hx    Liver disease Neg Hx     Social History Social History   Tobacco Use   Smoking status: Never   Smokeless tobacco: Never   Tobacco comments:    Never smoked  Vaping Use   Vaping status: Never Used  Substance Use Topics   Alcohol use: No    Alcohol/week: 0.0 standard drinks of alcohol   Drug use: No     Allergies   Anesthetics, ester; Codeine; and Penicillins   Review of Systems Review of Systems Per HPI  Physical Exam Triage Vital Signs ED Triage Vitals  Encounter Vitals Group     BP 01/10/24 1535 124/77     Systolic BP Percentile --      Diastolic BP Percentile --       Pulse Rate 01/10/24 1535 95     Resp 01/10/24 1535 20     Temp 01/10/24 1535 99 F (37.2 C)     Temp Source 01/10/24 1535 Oral     SpO2 01/10/24 1535 96 %     Weight --      Height --      Head Circumference --      Peak Flow --      Pain Score 01/10/24 1534 7     Pain Loc --      Pain Education --      Exclude from Growth Chart --    No data found.  Updated Vital Signs BP 124/77 (BP Location: Right Arm)   Pulse 95   Temp 99 F (37.2 C) (Oral)   Resp 20   SpO2 96%   Visual Acuity Right Eye Distance:   Left Eye Distance:   Bilateral Distance:    Right Eye Near:   Left Eye Near:    Bilateral Near:     Physical Exam Vitals and nursing note reviewed.  Constitutional:      Appearance: Normal appearance.  HENT:     Head: Atraumatic.     Right Ear: Tympanic membrane and external ear normal.     Left Ear: Tympanic membrane and external ear normal.     Nose: Rhinorrhea present.     Mouth/Throat:     Mouth: Mucous membranes are moist.     Pharynx: Posterior oropharyngeal erythema present.  Eyes:     Extraocular Movements: Extraocular movements intact.     Conjunctiva/sclera: Conjunctivae normal.  Cardiovascular:     Rate and Rhythm: Normal rate and regular rhythm.     Heart sounds: Normal heart sounds.  Pulmonary:     Effort: Pulmonary effort is normal.     Breath sounds: Normal breath sounds. No wheezing.  Musculoskeletal:        General: Normal range of motion.     Cervical back: Normal range of motion and neck supple.  Skin:    General: Skin is warm and dry.  Neurological:     Mental Status: She is alert and oriented to person, place, and time.  Psychiatric:        Mood and Affect: Mood normal.        Thought  Content: Thought content normal.      UC Treatments / Results  Labs (all labs ordered are listed, but only abnormal results are displayed) Labs Reviewed  POC COVID19/FLU A&B COMBO - Abnormal; Notable for the following components:      Result  Value   Influenza A Antigen, POC Positive (*)    All other components within normal limits    EKG   Radiology No results found.  Procedures Procedures (including critical care time)  Medications Ordered in UC Medications - No data to display  Initial Impression / Assessment and Plan / UC Course  I have reviewed the triage vital signs and the nursing notes.  Pertinent labs & imaging results that were available during my care of the patient were reviewed by me and considered in my medical decision making (see chart for details).     Vital signs and exam overall reassuring today, rapid flu positive for influenza A.  Treat with Tamiflu, Phenergan DM, Flonase, supportive over-the-counter medications and home care.  Return for worsening symptoms.  Final Clinical Impressions(s) / UC Diagnoses   Final diagnoses:  Influenza A   Discharge Instructions   None    ED Prescriptions     Medication Sig Dispense Auth. Provider   oseltamivir (TAMIFLU) 75 MG capsule Take 1 capsule (75 mg total) by mouth every 12 (twelve) hours. 10 capsule Particia Nearing, [place]   promethazine-dextromethorphan (PROMETHAZINE-DM) 6.25-15 MG/5ML syrup Take 5 mLs by mouth 4 (four) times daily as needed. 100 mL Particia Nearing, PA-C   fluticasone [facility]) 50 MCG/ACT nasal spray Place 1 spray into both nostrils 2 (two) times daily. 16 g Particia Nearing, [place]      PDMP not reviewed this encounter.   Particia Nearing, [place] 01/10/24 1606

## 2024-01-14 DIAGNOSIS — K219 Gastro-esophageal reflux disease without esophagitis: Secondary | ICD-10-CM | POA: Diagnosis not present

## 2024-01-14 DIAGNOSIS — I10 Essential (primary) hypertension: Secondary | ICD-10-CM | POA: Diagnosis not present

## 2024-01-23 DIAGNOSIS — I700 Atherosclerosis of aorta: Secondary | ICD-10-CM | POA: Diagnosis not present

## 2024-01-23 DIAGNOSIS — E785 Hyperlipidemia, unspecified: Secondary | ICD-10-CM | POA: Diagnosis not present

## 2024-01-23 DIAGNOSIS — I10 Essential (primary) hypertension: Secondary | ICD-10-CM | POA: Diagnosis not present

## 2024-01-24 LAB — LIPID PANEL
Chol/HDL Ratio: 3.1 ratio (ref 0.0–4.4)
Cholesterol, Total: 122 mg/dL (ref 100–199)
HDL: 39 mg/dL — ABNORMAL LOW (ref >39)
LDL Chol Calc (NIH): 68 mg/dL (ref 0–99)
Triglycerides: 74 mg/dL (ref 0–149)
VLDL Cholesterol Cal: 15 mg/dL (ref 5–40)

## 2024-01-24 LAB — ALT: ALT: 22 IU/L (ref 0–32)

## 2024-02-13 DIAGNOSIS — Z1331 Encounter for screening for depression: Secondary | ICD-10-CM | POA: Diagnosis not present

## 2024-02-13 DIAGNOSIS — I447 Left bundle-branch block, unspecified: Secondary | ICD-10-CM | POA: Diagnosis not present

## 2024-02-13 DIAGNOSIS — Z1389 Encounter for screening for other disorder: Secondary | ICD-10-CM | POA: Diagnosis not present

## 2024-02-13 DIAGNOSIS — Z0001 Encounter for general adult medical examination with abnormal findings: Secondary | ICD-10-CM | POA: Diagnosis not present

## 2024-02-13 DIAGNOSIS — M1990 Unspecified osteoarthritis, unspecified site: Secondary | ICD-10-CM | POA: Diagnosis not present

## 2024-02-13 DIAGNOSIS — Z7189 Other specified counseling: Secondary | ICD-10-CM | POA: Diagnosis not present

## 2024-02-13 DIAGNOSIS — Z1159 Encounter for screening for other viral diseases: Secondary | ICD-10-CM | POA: Diagnosis not present

## 2024-02-13 DIAGNOSIS — Z79899 Other long term (current) drug therapy: Secondary | ICD-10-CM | POA: Diagnosis not present

## 2024-02-13 DIAGNOSIS — I10 Essential (primary) hypertension: Secondary | ICD-10-CM | POA: Diagnosis not present

## 2024-02-13 DIAGNOSIS — K219 Gastro-esophageal reflux disease without esophagitis: Secondary | ICD-10-CM | POA: Diagnosis not present

## 2024-02-13 DIAGNOSIS — F3340 Major depressive disorder, recurrent, in remission, unspecified: Secondary | ICD-10-CM | POA: Diagnosis not present

## 2024-02-13 DIAGNOSIS — E785 Hyperlipidemia, unspecified: Secondary | ICD-10-CM | POA: Diagnosis not present

## 2024-02-27 ENCOUNTER — Other Ambulatory Visit: Payer: Self-pay | Admitting: Nurse Practitioner

## 2024-03-15 DIAGNOSIS — K219 Gastro-esophageal reflux disease without esophagitis: Secondary | ICD-10-CM | POA: Diagnosis not present

## 2024-03-15 DIAGNOSIS — I10 Essential (primary) hypertension: Secondary | ICD-10-CM | POA: Diagnosis not present

## 2024-03-26 ENCOUNTER — Ambulatory Visit: Payer: PPO | Admitting: Gastroenterology

## 2024-04-05 ENCOUNTER — Other Ambulatory Visit (HOSPITAL_COMMUNITY): Payer: Self-pay | Admitting: Internal Medicine

## 2024-04-05 DIAGNOSIS — Z1231 Encounter for screening mammogram for malignant neoplasm of breast: Secondary | ICD-10-CM

## 2024-04-09 ENCOUNTER — Ambulatory Visit (HOSPITAL_COMMUNITY)
Admission: RE | Admit: 2024-04-09 | Discharge: 2024-04-09 | Disposition: A | Discharge status: Home / Self Care | Source: Ambulatory Visit | Attending: Internal Medicine | Admitting: Internal Medicine

## 2024-04-09 ENCOUNTER — Encounter (HOSPITAL_COMMUNITY): Payer: Self-pay

## 2024-04-09 DIAGNOSIS — Z1231 Encounter for screening mammogram for malignant neoplasm of breast: Secondary | ICD-10-CM | POA: Insufficient documentation

## 2024-04-14 DIAGNOSIS — I10 Essential (primary) hypertension: Secondary | ICD-10-CM | POA: Diagnosis not present

## 2024-04-14 DIAGNOSIS — K219 Gastro-esophageal reflux disease without esophagitis: Secondary | ICD-10-CM | POA: Diagnosis not present

## 2024-04-16 NOTE — Progress Notes (Unsigned)
 GI Office Note    Referring Provider: Carlette Benita Area* Primary Care Physician:  [name], [name] Demissie, MD Primary Gastroenterologist: Carlin POUR. Cindie, DO  Date:  04/17/2024  ID:  Tannisha, [place] 01-May-1951, MRN [id number]  Chief Complaint   Chief Complaint  Patient presents with   Follow-up    Follow up. Wants to change dose of Linzess  to 72    History of Present Illness  Jamie Hunt is a 73 y.o. female with a history of GERD, dysphagia, constipation, anemia, HTN, and HLD presenting today to follow-up on constipation.  EGD December 2022: mild schatzki ring s/p dilation, gastritis with reactive gastropathy, normal duodenum. No H. Pylori   Colonoscopy December 2022: non-bleeding internal hemorrhoids, transverse and ascending diverticulosis, two 7-9 mm polyps in the ascending and cecum with biopsy revealing tubular adenomas. Due for repeat colonoscopy in 2027.   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   OV 08/09/22. Reflux with raw vegetables, fried foods, salads and sweets. Issues also with tomato products. Recently increased omeprazole  to 20 mg twice daily on most days. Rare nausea. Taking fiber supplements and stool softener daily. Stools daily or every other day. Feels better if doesn't overeat. Advised to increase omeprazole  to 40 mg BID. GERD diet/lifestyle modifications reinforced. Trial wean of stool softener given controlled Bms. F/u in 4 months.    OV 11/08/22. Having a daily bowel movement for the most part, occasionally skipping a day.  Had recently stop stool softeners.  Taking Beano for gas about twice per day.  Takes 2 spoonfuls of fiber every morning.  Denied any melena or BRBPR.  Taking omeprazole  twice daily and still having some occasional reflux with raw vegetables.  Denied any nausea, vomiting, or dysphagia.  Advised to continue daily fiber supplement.  Continue omeprazole  40 mg daily and take Pepcid twice  daily as needed for breakthrough.  Continue Gas-X.   OV 06/20/23.Straining and BM every 2-3 days. Tried miralax  once daily in the past but only for couple of weeks. Taking metamucil.Taking omeprazole  once daily and pepcid in the evening. Worsened symptoms in the evening. Trying to avoid triggers. Tries to eat before 5pm. Advised trulance  trial, fiber, gas ex and increase PPI to BID.  Trulance  was not covered therefore Linzess  sent in.   Last office visit 09/26/23.  Denied any GI bleeding or abdominal pain.  Having a bowel movement morning after breakfast and her Linzess .  Is having some occasional gas pains with feeling like she has to go.  Denied any nausea or vomiting or dysphagia.  Reflux improved with twice daily dosing, takes Pepcid occasionally.  If she has some dietary indiscretion she will take the Pepcid. Continue linzess  145 mcg and discussed possible trial of 72 mcg. Continue omeprazole  40 mg BID, pepcid as needed. Continue metamucil and GERD diet.    Today:  Had some diarrhea and then sometimes its regular. Denies significant urgency.  If she doesn't take it everyday then she is not able to go and then when she does go its a whole lot.  With the 145 if she take it daily it gives her some low energy and she can tell a difference in how she feels. She feels like something in her side that is gassy and that occasionally. Dropping the pill to every other day made the gas paisn better but then she was not going everyday. If she goes out she doesn't take it because she was going more often.  No overt abdominal pain, N/V, melena, brbrp.   Reflux is doing better if she eats like she is supposed to and takes her omeprazole . Taking the pepcid every once in a while.   She is taking the fiber as she is suppose to as well.   She has been exercising more everyday.   Wt Readings from Last 5 Encounters:  04/17/24 204 lb (92.5 kg)  12/08/23 208 lb (94.3 kg)  09/30/23 207 lb (93.9 kg)  09/26/23 208  lb 12.8 oz (94.7 kg)  06/20/23 210 lb (95.3 kg)    Current Outpatient Medications  Medication Sig Dispense Refill   acetaminophen  (TYLENOL ) 650 MG CR tablet Take 1,300 mg by mouth in the morning and at bedtime.     aspirin  EC 81 MG tablet Take 81 mg by mouth daily. Swallow whole.     Calcium  Carb-Cholecalciferol  (CALCIUM  500 + D PO) Take 1 tablet by mouth daily.     cholecalciferol  (VITAMIN D3) 25 MCG (1000 UT) tablet Take 1,000 Units by mouth in the morning.     cycloSPORINE (RESTASIS) 0.05 % ophthalmic emulsion 1 drop 2 (two) times daily.     ezetimibe  (ZETIA ) 10 MG tablet Take 1 tablet (10 mg total) by mouth daily. 90 tablet 3   famotidine (PEPCID) 20 MG tablet Take 20 mg by mouth every evening.     fluticasone  (FLONASE ) 50 MCG/ACT nasal spray Place 1 spray into both nostrils 2 (two) times daily. 16 g 2   fluticasone  (FLONASE ) 50 MCG/ACT nasal spray Place 1 spray into both nostrils 2 (two) times daily. 16 g 2   hydrALAZINE  (APRESOLINE ) 50 MG tablet Take 1 tablet (50 mg total) by mouth 3 (three) times daily. 270 tablet 3   hydrochlorothiazide  (HYDRODIURIL ) 25 MG tablet Take 25 mg by mouth in the morning.     linaclotide  (LINZESS ) 145 MCG CAPS capsule Take 1 capsule (145 mcg total) by mouth daily before breakfast. 90 capsule 3   losartan  (COZAAR ) 100 MG tablet Take 100 mg by mouth in the morning.     Menthol , Topical Analgesic, (BIOFREEZE) 10 % CREA Apply 1 Application topically daily as needed (pain). Sometimes use roll-on     montelukast  (SINGULAIR ) 10 MG tablet Take 10 mg by mouth at bedtime.     Multiple Vitamin (MULTIVITAMIN WITH MINERALS) TABS tablet Take 1 tablet by mouth in the morning.     omeprazole  (PRILOSEC) 40 MG capsule Take 1 capsule (40 mg total) by mouth 2 (two) times daily before a meal. 180 capsule 3   Potassium 99 MG TABS Take 99 mg by mouth in the morning.     rosuvastatin  (CRESTOR ) 40 MG tablet Take 1 tablet (40 mg total) by mouth daily. 90 tablet 3   traZODone  (DESYREL) 50 MG tablet Take 50 mg by mouth at bedtime.     verapamil  (CALAN -SR) 240 MG CR tablet Take 240 mg by mouth at bedtime.     escitalopram  (LEXAPRO ) 10 MG tablet Take 10 mg by mouth at bedtime.     oseltamivir  (TAMIFLU ) 75 MG capsule Take 1 capsule (75 mg total) by mouth every 12 (twelve) hours. 10 capsule 0   Polyethyl Glycol-Propyl Glycol (SYSTANE OP) Place 1 drop into both eyes 2 (two) times daily.     promethazine -dextromethorphan (PROMETHAZINE -DM) 6.25-15 MG/5ML syrup Take 5 mLs by mouth 4 (four) times daily as needed. 100 mL 0   No current facility-administered medications for this visit.    Past Medical History:  Diagnosis Date  Acid reflux    Anemia    Arthritis    High cholesterol    Hypertension    LBBB (left bundle branch block)    PONV (postoperative nausea and vomiting)    Shingles     Past Surgical History:  Procedure Laterality Date   ABDOMINAL HYSTERECTOMY  1995   BACK SURGERY  2014   L4-L5   BALLOON DILATION N/A 09/11/2021   Procedure: BALLOON DILATION;  Surgeon: Cindie Carlin POUR, DO;  [facility];  Service: Endoscopy;  Laterality: N/A;   BIOPSY  09/11/2021   Procedure: BIOPSY;  Surgeon: Cindie Carlin POUR, DO;  [facility]: [facility];  Service: Endoscopy;;   CATARACT EXTRACTION W/PHACO Right 05/18/2019   Procedure: CATARACT EXTRACTION PHACO AND INTRAOCULAR LENS PLACEMENT (IOC);  Surgeon: Harrie Agent, MD;  Location: [facility];  Service: Ophthalmology;  Laterality: Right;  CDE: 6.76   CATARACT EXTRACTION W/PHACO Left 06/01/2019   Procedure: CATARACT EXTRACTION PHACO AND INTRAOCULAR LENS PLACEMENT (IOC);  Surgeon: Harrie Agent, MD;  Location: [facility];  Service: Ophthalmology;  Laterality: Left;  left, CDE: 8.69   CHOLECYSTECTOMY  2000   COLONOSCOPY  05/08/2009   SLF:A few scattered diverticula throughout the entire colon.  Small  internal hemorrhoids.  Otherwise, no polyps, masses, inflammatory changes, or AVMs seen.  Normal retroflexed view  of the rectum   COLONOSCOPY N/A 06/03/2014   SLF:  1. six colon polyps removed. 2. Moderate sized internal hemorrhoids. 3. The left colon is redundant 4.  Moderate diverticulosis throughout the entire examed colon. next TCS in10 years with overtube.   COLONOSCOPY WITH PROPOFOL  N/A 09/11/2021   Procedure: COLONOSCOPY WITH PROPOFOL ;  Surgeon: Cindie Carlin POUR, DO;  [facility];  Service: Endoscopy;  Laterality: N/A;  10:45am   ESOPHAGOGASTRODUODENOSCOPY N/A 06/03/2014   SLF:  1. small hiatal hernia   ESOPHAGOGASTRODUODENOSCOPY N/A 02/16/2016   slf: gastritis, schazki ring   ESOPHAGOGASTRODUODENOSCOPY (EGD) WITH PROPOFOL  N/A 09/11/2021   Procedure: ESOPHAGOGASTRODUODENOSCOPY (EGD) WITH PROPOFOL ;  Surgeon: Cindie Carlin POUR, DO;  [facility];  Service: Endoscopy;  Laterality: N/A;   GIVENS CAPSULE STUDY N/A 03/19/2016   Procedure: GIVENS CAPSULE STUDY;  Surgeon: Margo LITTIE Haddock, MD;  [facility];  Service: Endoscopy;  Laterality: N/A;  0700   TOE ARTHROPLASTY Right 04/20/2023   Procedure: DEROTATIONAL ARHTHROPLASTY OF RIGHT 5TH TOE;  Surgeon: Tobie Buckles, DPM;  Location: [facility];  Service: Podiatry;  Laterality: Right;   TOTAL HIP ARTHROPLASTY Left 04/30/2022   Procedure: LEFT TOTAL HIP ARTHROPLASTY ANTERIOR APPROACH;  Surgeon: Vernetta Lonni GRADE, MD;  Location: [facility];  Service: Orthopedics;  Laterality: Left;   VASCULAR SURGERY  1992   varicose veins    Family History  Problem Relation Age of Onset   Heart disease Sister    Colon cancer Neg Hx    Liver disease Neg Hx     Allergies as of 04/17/2024 - Review Complete 04/17/2024  Allergen Reaction Noted   Anesthetics, ester Nausea And Vomiting 04/22/2022   Codeine Nausea And Vomiting 04/22/2014   Penicillins  11/30/2011    Social History   Socioeconomic History   Marital status: Widowed    Spouse name: Not on file   Number of children: 2   Years of education: Not on file   Highest  education level: Not on file  Occupational History   Not on file  Tobacco Use   Smoking status: Never   Smokeless tobacco: Never   Tobacco comments:  Never smoked  Vaping Use   Vaping status: Never Used  Substance and Sexual Activity   Alcohol  use: No    Alcohol /week: 0.0 standard drinks of alcohol    Drug use: No   Sexual activity: Not Currently  Other Topics Concern   Not on file  Social History Narrative   Not on file   Social Drivers of Health   Financial Resource Strain: Not on file  Food Insecurity: Not on file  Transportation Needs: Not on file  Physical Activity: Not on file  Stress: Not on file  Social Connections: Not on file     Review of Systems   Gen: Denies fever, chills, anorexia. Denies fatigue, weakness, weight loss.  CV: Denies chest pain, palpitations, syncope, peripheral edema, and claudication. Resp: Denies dyspnea at rest, cough, wheezing, coughing up blood, and pleurisy. GI: See HPI Derm: Denies rash, itching, dry skin Psych: Denies depression, anxiety, memory loss, confusion. No homicidal or suicidal ideation.  Heme: Denies bruising, bleeding, and enlarged lymph nodes.  Physical Exam   BP 130/79 (BP Location: Right Arm, Patient Position: Sitting, Cuff Size: Large)   Pulse 69   Temp 97.9 F (36.6 C) (Temporal)   Ht 5' 11 (1.803 m)   Wt 204 lb (92.5 kg)   BMI 28.45 kg/m   General:   Alert and oriented. No distress noted. Pleasant and cooperative.  Head:  Normocephalic and atraumatic. Eyes:  Conjuctiva clear without scleral icterus. Mouth:  Oral mucosa pink and moist. Good dentition. No lesions. Abdomen:  +BS, soft, non-tender and non-distended. No rebound or guarding. No HSM or masses noted. Rectal: deferred Msk:  Symmetrical without gross deformities. Normal posture. Extremities:  Without edema. Neurologic:  Alert and  oriented x4 Psych:  Alert and cooperative. Normal mood and affect.  Assessment  Jamie Hunt is a 73 y.o.  female presenting today to discuss constipation and reducing Linzess  dose.  Chronic constipation, gassiness, bloating: Constipation fairly controlled with Linzess  145 mcg however with daily use she has too many bowel movements and will experience some fatigue/tiredness secondary to this.  If she does not take the medicine that she does not have a bowel movement.  Given the significant amounts of stools that she has on days that she takes the 145 she is ready to tightly 72 mcg dosing daily to see if this still helps her have regular bowel movements and not too much frequency.  Does occasionally still have some gassiness and bloating which gives her some discomfort on her left side.  She continues to take Metamucil daily.  We discussed trialing IBgard versus FDgard today, given she usually feels little bit more gassy after meals we will trial FD guard to start with.  Also discussed that if she does not have good benefit with Linzess  72 mcg that we could trial another prescriptive agent such as Trulance , Amitiza, or Ibsrela.  Also gave recommendation that if she thought it was secondary to constipation that she could try Gas-X or Phazyme.  GERD: Well-controlled with omeprazole  40 mg twice daily and dietary avoidance of triggers.  Continues to use famotidine sparingly for breakthrough symptoms.  GERD diet reinforced today.  Hopeful that FDgard may also help with some bloating and gassiness.  PLAN   Decrease Linzess  to 72 mcg, will provide some samples today.  If effective she is to let us  know and we can send in prescription. Continue omeprazole  40 mg twice daily Pepcid 20 mg nightly as needed Continue Metamucil daily.  Can  trial FDGard.  Ibgard, Gas-X, or phazyme if no improvement with Fdgard.  GERD diet Follow up 3 months    Charmaine Melia, MSN, FNP-BC, AGACNP-BC [facility]

## 2024-04-17 ENCOUNTER — Ambulatory Visit: Admitting: Gastroenterology

## 2024-04-17 ENCOUNTER — Encounter: Payer: Self-pay | Admitting: Gastroenterology

## 2024-04-17 VITALS — BP 130/79 | HR 69 | Temp 97.9°F | Ht 71.0 in | Wt 204.0 lb

## 2024-04-17 DIAGNOSIS — K5909 Other constipation: Secondary | ICD-10-CM | POA: Diagnosis not present

## 2024-04-17 DIAGNOSIS — K219 Gastro-esophageal reflux disease without esophagitis: Secondary | ICD-10-CM | POA: Diagnosis not present

## 2024-04-17 DIAGNOSIS — R140 Abdominal distension (gaseous): Secondary | ICD-10-CM | POA: Diagnosis not present

## 2024-04-17 DIAGNOSIS — K5904 Chronic idiopathic constipation: Secondary | ICD-10-CM

## 2024-04-17 NOTE — Patient Instructions (Addendum)
 For constipation: I am providing you some samples of Linzess  72 mcg.  Please try taking this 1 tablet daily before breakfast. Continue taking Metamucil daily.  You can take this during the day or at night, it does not have to be in the morning time. Continue to follow a high-fiber diet If FDgard does not seem to work for the bloating and gassiness then you may take Gas-X, Phazyme, or Ibgard as needed.   For reflux: Continue omeprazole  40 mg twice daily You may continue to use Pepcid as needed for breakthrough. Follow a GERD diet:  Avoid fried, fatty, greasy, spicy, citrus foods. Avoid caffeine and carbonated beverages. Avoid chocolate. Try eating 4-6 small meals a day rather than 3 large meals. Do not eat within 3 hours of laying down. Prop head of bed up on wood or bricks to create a 6 inch incline You may trial FD guard, this should be on the same aisle as antacid medications, it is usually a purple box.  I will provide you some samples today if we have any.  We will follow-up in 3 months, sooner if needed.  It was a pleasure to see you today. I want to create trusting relationships with patients. If you receive a survey regarding your visit,  I greatly appreciate you taking time to fill this out on paper or through your MyChart. I value your feedback.  Charmaine Melia, MSN, FNP-BC, AGACNP-BC [facility]

## 2024-05-15 DIAGNOSIS — K219 Gastro-esophageal reflux disease without esophagitis: Secondary | ICD-10-CM | POA: Diagnosis not present

## 2024-05-15 DIAGNOSIS — I10 Essential (primary) hypertension: Secondary | ICD-10-CM | POA: Diagnosis not present

## 2024-06-01 ENCOUNTER — Encounter: Payer: Self-pay | Admitting: Radiology

## 2024-06-15 DIAGNOSIS — I10 Essential (primary) hypertension: Secondary | ICD-10-CM | POA: Diagnosis not present

## 2024-06-15 DIAGNOSIS — K219 Gastro-esophageal reflux disease without esophagitis: Secondary | ICD-10-CM | POA: Diagnosis not present

## 2024-06-25 ENCOUNTER — Encounter: Payer: Self-pay | Admitting: Gastroenterology

## 2024-07-05 ENCOUNTER — Encounter: Payer: Self-pay | Admitting: Gastroenterology

## 2024-07-05 ENCOUNTER — Ambulatory Visit: Admitting: Gastroenterology

## 2024-07-05 VITALS — BP 138/78 | HR 68 | Temp 97.6°F | Ht 71.0 in | Wt 208.2 lb

## 2024-07-05 DIAGNOSIS — K648 Other hemorrhoids: Secondary | ICD-10-CM

## 2024-07-05 DIAGNOSIS — L290 Pruritus ani: Secondary | ICD-10-CM

## 2024-07-05 DIAGNOSIS — K219 Gastro-esophageal reflux disease without esophagitis: Secondary | ICD-10-CM

## 2024-07-05 DIAGNOSIS — K5904 Chronic idiopathic constipation: Secondary | ICD-10-CM

## 2024-07-05 DIAGNOSIS — K640 First degree hemorrhoids: Secondary | ICD-10-CM

## 2024-07-05 MED ORDER — LINACLOTIDE 72 MCG PO CAPS: 72.0000 ug | ORAL_CAPSULE | Freq: Every day | ORAL | 3 refills | Status: AC

## 2024-07-05 MED ORDER — OMEPRAZOLE 40 MG PO CPDR: 40.0000 mg | DELAYED_RELEASE_CAPSULE | Freq: Two times a day (BID) | ORAL | 3 refills | Status: AC

## 2024-07-05 MED ORDER — HYDROCORTISONE (PERIANAL) 2.5 % EX CREA: 1.0000 | TOPICAL_CREAM | Freq: Two times a day (BID) | CUTANEOUS | 1 refills | Status: AC

## 2024-07-05 NOTE — Patient Instructions (Signed)
 I have sent in a prescription of Linzess  72 mcg for you.  You may continue taking the 145 every other day until you are able to get your new prescription.  Continue taking your fiber as well.  Continue to try to avoid straining and limit your toilet time to about 3-5 minutes if possible to prevent further hemorrhoids.  I have sent in a prescription rectal hydrocortisone  for you to use externally and apply internally twice a day for 5-7 days and then use as needed.  Please look over the hemorrhoid banding pamphlet that I have provided for you as you may be a good candidate for this to help with mucosal prolapse and help with the hemorrhoids that you do have in regards to the itching.  Continue taking your omeprazole  40 mg twice daily and continue your FDgard to help you with some of the gas and bloating.  Follow a GERD diet:  Avoid fried, fatty, greasy, spicy, citrus foods. Avoid caffeine and carbonated beverages. Avoid chocolate. Try eating 4-6 small meals a day rather than 3 large meals. Do not eat within 3 hours of laying down. Prop head of bed up on wood or bricks to create a 6 inch incline.   We will plan to follow-up in 3-4 months.  If you are having any recurrent symptoms prior to then or if you would like to schedule hemorrhoid banding please call the office sooner.  If I do not see you before the holidays I hope you have a wonderful Thanksgiving, Christmas, and a Happy new year.  It was a pleasure to see you today. I want to create trusting relationships with patients. If you receive a survey regarding your visit,  I greatly appreciate you taking time to fill this out on paper or through your MyChart. I value your feedback.  Charmaine Melia, MSN, FNP-BC, AGACNP-BC [facility]

## 2024-07-05 NOTE — Progress Notes (Signed)
 GI Office Note    Referring Provider: Carlette Benita Area* Primary Care Physician:  [name], [name] Demissie, MD Primary Gastroenterologist: Carlin POUR. Cindie, DO  Date:  07/05/2024  ID:  Jamie Hunt, Jamie Hunt August 30, 1951, MRN [id number]   Chief Complaint   Chief Complaint  Patient presents with   Follow-up    Follow up. Having problems with hemorrhoids    History of Present Illness  Jamie Hunt is a 73 y.o. female with a history of GERD, dysphagia, constipation, anemia, HTN, HLD presenting today with complaint of hemorrhoids and follow-up on her constipation  EGD December 2022: mild schatzki ring s/p dilation, gastritis with reactive gastropathy, normal duodenum. No H. Pylori   Colonoscopy December 2022: non-bleeding internal hemorrhoids, transverse and ascending diverticulosis, two 7-9 mm polyps in the ascending and cecum with biopsy revealing tubular adenomas. Due for repeat colonoscopy in 2027.   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   OV 08/09/22. Reflux with raw vegetables, fried foods, salads and sweets. Issues also with tomato products. Recently increased omeprazole  to 20 mg twice daily on most days. Rare nausea. Taking fiber supplements and stool softener daily. Stools daily or every other day. Feels better if doesn't overeat. Advised to increase omeprazole  to 40 mg BID. GERD diet/lifestyle modifications reinforced. Trial wean of stool softener given controlled Bms. F/u in 4 months.    OV 11/08/22. Having a daily bowel movement for the most part, occasionally skipping a day.  Had recently stop stool softeners.  Taking Beano for gas about twice per day.  Takes 2 spoonfuls of fiber every morning.  Denied any melena or BRBPR.  Taking omeprazole  twice daily and still having some occasional reflux with raw vegetables.  Denied any nausea, vomiting, or dysphagia.  Advised to continue daily fiber supplement.  Continue omeprazole  40 mg  daily and take Pepcid twice daily as needed for breakthrough.  Continue Gas-X.   OV 06/20/23.Straining and BM every 2-3 days. Tried miralax  once daily in the past but only for couple of weeks. Taking metamucil.Taking omeprazole  once daily and pepcid in the evening. Worsened symptoms in the evening. Trying to avoid triggers. Tries to eat before 5pm. Advised trulance  trial, fiber, gas ex and increase PPI to BID.  Trulance  was not covered therefore Linzess  sent in.    OV 09/26/23.  Denied any GI bleeding or abdominal pain.  Having a bowel movement morning after breakfast and her Linzess .  Is having some occasional gas pains with feeling like she has to go.  Denied any nausea or vomiting or dysphagia.  Reflux improved with twice daily dosing, takes Pepcid occasionally.  If she has some dietary indiscretion she will take the Pepcid. Continue linzess  145 mcg and discussed possible trial of 72 mcg. Continue omeprazole  40 mg BID, pepcid as needed. Continue metamucil and GERD diet.    Last office visit 04/17/24.  Reporting some diarrhea and sometimes regular but denies any significant urgency.  She is not taking medication every day that she is not able to go when she does go and is for lots.  If she is taking the 145 daily she reports some low energy and can tell a difference in how she feels.  Also having some gassiness occasionally.  Going to every other day with the Linzess  is making gas pains better but then not going every day.  If going out she does not take it either given she goes more often.  Denies any overt abdominal pain,  nausea, vomiting, melena, or BRBPR.  Reflux improved with taking omeprazole  and making dietary changes.  Will take the Pepcid every once in a while.  Also taking her fiber and exercising more.  Advised to decrease her Linzess  to 72 mcg and provided samples.  Advised her to let us  know if effective that way we can send in prescription.  Continue omeprazole  40 mg twice daily and Pepcid 20 mg  nightly as needed.  Trial of FDgard and continue Metamucil daily.  Discussed IBgard, Gas-X, or Phazyme for bloating if no improvement with FDgard.  Follow-up 3 months.   Today:  Discussed the use of AI scribe software for clinical note transcription with the patient, who gave verbal consent to proceed.  She has ongoing constipation issues. Initially, she used Linzess  at 145 mcg, which was too strong, and adjusted to 72 mcg with samples. This lower dose helps her have regular bowel movements, though she sometimes goes two to three days without one if she does not take Linzess . She also uses IBgard or FDgard, which she finds beneficial. Insufficient water  intake can cause reflux symptoms due to the peppermint oil in IB Guard, but drinking water  alleviates this. She takes omeprazole  regularly and finds it helpful as well. No dysphagia.   She experiences gas and bloating, particularly on the left side, associated with constipation. Chewing gum increases her gas due to swallowing air. She uses IB Guard and FD Guard to manage these symptoms, taking them as needed, especially after heavy meals.  She reports hemorrhoids causing itching and occasional pain, but no bleeding. She uses over-the-counter hydrocortisone  cream for relief, though it is less effective than before. The itching is primarily internal, with occasional external discomfort. No melena or brbpr.       Wt Readings from Last 5 Encounters:  07/05/24 208 lb 3.2 oz (94.4 kg)  04/17/24 204 lb (92.5 kg)  12/08/23 208 lb (94.3 kg)  09/30/23 207 lb (93.9 kg)  09/26/23 208 lb 12.8 oz (94.7 kg)    Current Outpatient Medications  Medication Sig Dispense Refill   acetaminophen  (TYLENOL ) 650 MG CR tablet Take 1,300 mg by mouth in the morning and at bedtime.     aspirin  EC 81 MG tablet Take 81 mg by mouth daily. Swallow whole.     Calcium  Carb-Cholecalciferol  (CALCIUM  500 + D PO) Take 1 tablet by mouth daily.     cholecalciferol  (VITAMIN D3) 25  MCG (1000 UT) tablet Take 1,000 Units by mouth in the morning.     cycloSPORINE (RESTASIS) 0.05 % ophthalmic emulsion 1 drop 2 (two) times daily.     ezetimibe  (ZETIA ) 10 MG tablet Take 1 tablet (10 mg total) by mouth daily. 90 tablet 3   famotidine (PEPCID) 20 MG tablet Take 20 mg by mouth every evening.     fluticasone  (FLONASE ) 50 MCG/ACT nasal spray Place 1 spray into both nostrils 2 (two) times daily. 16 g 2   hydrALAZINE  (APRESOLINE ) 50 MG tablet Take 1 tablet (50 mg total) by mouth 3 (three) times daily. 270 tablet 3   hydrochlorothiazide  (HYDRODIURIL ) 25 MG tablet Take 25 mg by mouth in the morning.     linaclotide  (LINZESS ) 145 MCG CAPS capsule Take 1 capsule (145 mcg total) by mouth daily before breakfast. 90 capsule 3   losartan  (COZAAR ) 100 MG tablet Take 100 mg by mouth in the morning.     Menthol , Topical Analgesic, (BIOFREEZE) 10 % CREA Apply 1 Application topically daily as needed (pain). Sometimes use  roll-on     montelukast  (SINGULAIR ) 10 MG tablet Take 10 mg by mouth at bedtime.     Multiple Vitamin (MULTIVITAMIN WITH MINERALS) TABS tablet Take 1 tablet by mouth in the morning.     omeprazole  (PRILOSEC) 40 MG capsule Take 1 capsule (40 mg total) by mouth 2 (two) times daily before a meal. 180 capsule 3   Potassium 99 MG TABS Take 99 mg by mouth in the morning.     rosuvastatin  (CRESTOR ) 40 MG tablet Take 1 tablet (40 mg total) by mouth daily. 90 tablet 3   traZODone (DESYREL) 50 MG tablet Take 50 mg by mouth at bedtime.     verapamil  (CALAN -SR) 240 MG CR tablet Take 240 mg by mouth at bedtime.     No current facility-administered medications for this visit.    Past Medical History:  Diagnosis Date   Acid reflux    Anemia    Arthritis    High cholesterol    Hypertension    LBBB (left bundle branch block)    PONV (postoperative nausea and vomiting)    Shingles     Past Surgical History:  Procedure Laterality Date   ABDOMINAL HYSTERECTOMY  1995   BACK SURGERY  2014    L4-L5   BALLOON DILATION N/A 09/11/2021   Procedure: BALLOON DILATION;  Surgeon: Cindie Carlin POUR, DO;  [facility]: [facility];  Service: Endoscopy;  Laterality: N/A;   BIOPSY  09/11/2021   Procedure: BIOPSY;  Surgeon: Cindie Carlin POUR, DO;  Location: [facility];  Service: Endoscopy;;   CATARACT EXTRACTION W/PHACO Right 05/18/2019   Procedure: CATARACT EXTRACTION PHACO AND INTRAOCULAR LENS PLACEMENT (IOC);  Surgeon: Harrie Agent, MD;  Location: [facility];  Service: Ophthalmology;  Laterality: Right;  CDE: 6.76   CATARACT EXTRACTION W/PHACO Left 06/01/2019   Procedure: CATARACT EXTRACTION PHACO AND INTRAOCULAR LENS PLACEMENT (IOC);  Surgeon: Harrie Agent, MD;  Location: [facility];  Service: Ophthalmology;  Laterality: Left;  left, CDE: 8.69   CHOLECYSTECTOMY  2000   COLONOSCOPY  05/08/2009   SLF:A few scattered diverticula throughout the entire colon.  Small  internal hemorrhoids.  Otherwise, no polyps, masses, inflammatory changes, or AVMs seen.  Normal retroflexed view of the rectum   COLONOSCOPY N/A 06/03/2014   SLF:  1. six colon polyps removed. 2. Moderate sized internal hemorrhoids. 3. The left colon is redundant 4.  Moderate diverticulosis throughout the entire examed colon. next TCS in10 years with overtube.   COLONOSCOPY WITH PROPOFOL  N/A 09/11/2021   Procedure: COLONOSCOPY WITH PROPOFOL ;  Surgeon: Cindie Carlin POUR, DO;  [facility]: [facility];  Service: Endoscopy;  Laterality: N/A;  10:45am   ESOPHAGOGASTRODUODENOSCOPY N/A 06/03/2014   SLF:  1. small hiatal hernia   ESOPHAGOGASTRODUODENOSCOPY N/A 02/16/2016   slf: gastritis, schazki ring   ESOPHAGOGASTRODUODENOSCOPY (EGD) WITH PROPOFOL  N/A 09/11/2021   Procedure: ESOPHAGOGASTRODUODENOSCOPY (EGD) WITH PROPOFOL ;  Surgeon: Cindie Carlin POUR, DO;  [facility];  Service: Endoscopy;  Laterality: N/A;   GIVENS CAPSULE STUDY N/A 03/19/2016   Procedure: GIVENS CAPSULE STUDY;  Surgeon: Margo LITTIE Haddock, MD;  [facility];  Service: Endoscopy;  Laterality: N/A;  0700   TOE ARTHROPLASTY Right 04/20/2023   Procedure: DEROTATIONAL ARHTHROPLASTY OF RIGHT 5TH TOE;  Surgeon: Tobie Buckles, DPM;  Location: [facility];  Service: Podiatry;  Laterality: Right;   TOTAL HIP ARTHROPLASTY Left 04/30/2022   Procedure: LEFT TOTAL HIP ARTHROPLASTY ANTERIOR APPROACH;  Surgeon: Vernetta Lonni GRADE, MD;  Location: [facility];  Service: Orthopedics;  Laterality: Left;   VASCULAR SURGERY  1992   varicose veins    Family History  Problem Relation Age of Onset   Heart disease Sister    Colon cancer Neg Hx    Liver disease Neg Hx     Allergies as of 07/05/2024 - Review Complete 07/05/2024  Allergen Reaction Noted   Anesthetics, ester Nausea And Vomiting 04/22/2022   Codeine Nausea And Vomiting 04/22/2014   Penicillins  11/30/2011    Social History   Socioeconomic History   Marital status: Widowed    Spouse name: Not on file   Number of children: 2   Years of education: Not on file   Highest education level: Not on file  Occupational History   Not on file  Tobacco Use   Smoking status: Never   Smokeless tobacco: Never   Tobacco comments:    Never smoked  Vaping Use   Vaping status: Never Used  Substance and Sexual Activity   Alcohol  use: No    Alcohol /week: 0.0 standard drinks of alcohol    Drug use: No   Sexual activity: Not Currently  Other Topics Concern   Not on file  Social History Narrative   Not on file   Social Drivers of Health   Financial Resource Strain: Not on file  Food Insecurity: Not on file  Transportation Needs: Not on file  Physical Activity: Not on file  Stress: Not on file  Social Connections: Not on file     Review of Systems   Gen: Denies fever, chills, anorexia. Denies fatigue, weakness, weight loss.  CV: Denies chest pain, palpitations, syncope, peripheral edema, and claudication. Resp: Denies dyspnea at rest, cough, wheezing, coughing up blood, and pleurisy. GI:  See HPI Derm: Denies rash, itching, dry skin Psych: Denies depression, anxiety, memory loss, confusion. No homicidal or suicidal ideation.  Heme: Denies bruising, bleeding, and enlarged lymph nodes.  Physical Exam   BP 138/78 (BP Location: Right Arm, Patient Position: Sitting, Cuff Size: Large)   Pulse 68   Temp 97.6 F (36.4 C) (Temporal)   Ht 5' 11 (1.803 m)   Wt 208 lb 3.2 oz (94.4 kg)   BMI 29.04 kg/m   General:   Alert and oriented. No distress noted. Pleasant and cooperative.  Head:  Normocephalic and atraumatic. Eyes:  Conjuctiva clear without scleral icterus. Mouth:  Oral mucosa pink and moist. Good dentition. No lesions. Rectal: Old external hemorrhoids. Mild rectal mucosa prolapse, small palpable internal hemorrhoids more to right posterior and right anterior. Stool present within the rectal vault.  Decreased rectal tone.  No evidence of mass. Extremities:  Without edema. Neurologic:  Alert and  oriented x4 Psych:  Alert and cooperative. Normal mood and affect.  Assessment & Plan  Jamie Hunt is a 73 y.o. female presenting today with constipation and concern for hemorrhoids.     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 No bleeding reported. Examination revealed old external hemorrhoids and prolapsing rectal tissue along with some small palpable internal hemorrhoids and decreased rectal tone. - Prescribe hydrocortisone  rectal cream, apply twice daily for 5-7 days then use as needed.  - Provide pamphlet on hemorrhoid banding for education - Instruct to return if symptoms worsen or bleeding occurs  Chronic idiopathic constipation and gassiness Chronic constipation with improved bowel movements on Linzess  72 mcg, did well with the samples. Linzess  145 mcg was too strong. Experiences incomplete  evacuation without Linzess . Has been taking Linzess  145 mcg every  other day and having worsening bloating and gassiness on the days without taking Linzess . Has continued fiber supplements daily also.  Gassiness could be secondary to constipation, GERD, or chewing gum given aerophagia.  - Prescribe Linzess  72 mcg daily.  - Continue Linzess  145 mcg every other day until 72 mcg prescription is filled - Continue fiber supplementation.   Gastroesophageal reflux disease GERD symptoms managed with omeprazole . Peppermint oil (IB Guard) causes mild reflux symptoms, managed with increased water  intake. FD Guard used for postprandial fullness. Chewing gum and hard candy may introduce air, exacerbating gas and discomfort. - Continue omeprazole . Refilled.  - Advise increased water  intake with peppermint oil (IBgard) to reduce reflux - Consider Pepcid for severe reflux symptoms - Continue FD Guard as needed for postprandial fullness - Try to eat smaller more frequent meals.      Follow up   Follow up 3-4 months.     Charmaine Melia, MSN, FNP-BC, AGACNP-BC [facility]

## 2024-07-15 DIAGNOSIS — K219 Gastro-esophageal reflux disease without esophagitis: Secondary | ICD-10-CM | POA: Diagnosis not present

## 2024-07-15 DIAGNOSIS — I10 Essential (primary) hypertension: Secondary | ICD-10-CM | POA: Diagnosis not present

## 2024-08-13 ENCOUNTER — Encounter: Payer: Self-pay | Admitting: Radiology

## 2024-08-15 DIAGNOSIS — B372 Candidiasis of skin and nail: Secondary | ICD-10-CM | POA: Diagnosis not present

## 2024-08-15 DIAGNOSIS — I10 Essential (primary) hypertension: Secondary | ICD-10-CM | POA: Diagnosis not present

## 2024-08-15 DIAGNOSIS — F3340 Major depressive disorder, recurrent, in remission, unspecified: Secondary | ICD-10-CM | POA: Diagnosis not present

## 2024-08-15 DIAGNOSIS — M1990 Unspecified osteoarthritis, unspecified site: Secondary | ICD-10-CM | POA: Diagnosis not present

## 2024-08-15 DIAGNOSIS — E785 Hyperlipidemia, unspecified: Secondary | ICD-10-CM | POA: Diagnosis not present

## 2024-08-15 DIAGNOSIS — Z23 Encounter for immunization: Secondary | ICD-10-CM | POA: Diagnosis not present

## 2024-08-15 DIAGNOSIS — K219 Gastro-esophageal reflux disease without esophagitis: Secondary | ICD-10-CM | POA: Diagnosis not present

## 2024-09-04 ENCOUNTER — Encounter: Payer: Self-pay | Admitting: Gastroenterology

## 2024-09-14 DIAGNOSIS — I10 Essential (primary) hypertension: Secondary | ICD-10-CM | POA: Diagnosis not present

## 2024-09-14 DIAGNOSIS — K219 Gastro-esophageal reflux disease without esophagitis: Secondary | ICD-10-CM | POA: Diagnosis not present

## 2024-10-25 ENCOUNTER — Encounter: Payer: Self-pay | Admitting: Gastroenterology

## 2024-10-25 ENCOUNTER — Ambulatory Visit: Admitting: Gastroenterology

## 2024-10-25 VITALS — BP 138/75 | HR 69 | Temp 97.5°F | Ht 71.0 in | Wt 209.4 lb

## 2024-10-25 DIAGNOSIS — K640 First degree hemorrhoids: Secondary | ICD-10-CM

## 2024-10-25 DIAGNOSIS — K649 Unspecified hemorrhoids: Secondary | ICD-10-CM | POA: Diagnosis not present

## 2024-10-25 DIAGNOSIS — R140 Abdominal distension (gaseous): Secondary | ICD-10-CM

## 2024-10-25 DIAGNOSIS — K5909 Other constipation: Secondary | ICD-10-CM

## 2024-10-25 DIAGNOSIS — K219 Gastro-esophageal reflux disease without esophagitis: Secondary | ICD-10-CM

## 2024-10-25 DIAGNOSIS — L299 Pruritus, unspecified: Secondary | ICD-10-CM

## 2024-10-25 DIAGNOSIS — K5904 Chronic idiopathic constipation: Secondary | ICD-10-CM

## 2024-10-25 NOTE — Patient Instructions (Addendum)
 Continue taking Linzess  72 mcg daily.  If you end up skipping days due to going out or doing things then you may take 2 capsules the following day or you can just take it once you get home in the afternoon just as long as it is at least 30 minutes before a meal or 1-2 hours after to avoid the urgency that it can cause when taking too close to a meal.  I still want you to continue to take the Metamucil fiber supplement every day  For some the gassiness please continue taking IBgard or FDgard as needed to help with the symptoms.  Continue taking your omeprazole  40 mg twice daily and famotidine in the evening as needed especially if overeating.  Follow-up in 6 months, sooner if needed.  If you feel your constipation is getting worse or reflux is getting worse please feel free to reach out to me before then.  It was a pleasure to see you today. I want to create trusting relationships with patients. If you receive a survey regarding your visit,  I greatly appreciate you taking time to fill this out on paper or through your MyChart. I value your feedback.  Charmaine Melia, MSN, FNP-BC, AGACNP-BC [facility]

## 2024-10-25 NOTE — Progress Notes (Signed)
 "  GI Office Note    Referring Provider: Carlette Benita Area, MD Primary Care Physician:  [name], [name] Demissie, MD Primary Gastroenterologist: Carlin POUR. Cindie, DO  Date:  10/25/2024  ID:  Jamie Hunt, Jamie Hunt 04-05-1951, MRN [id number]   Chief Complaint   Chief Complaint  Patient presents with   Follow-up    Follow up. Still having issues with constipation, but better than what it was     History of Present Illness  Jamie Hunt is a 74 y.o. female with a history of GERD, dysphagia, constipation, anemia, HTN, HLD presenting today with complaint of constipation although slightly improved.  EGD December 2022: mild schatzki ring s/p dilation, gastritis with reactive gastropathy, normal duodenum. No H. Pylori   Colonoscopy December 2022: non-bleeding internal hemorrhoids, transverse and ascending diverticulosis, two 7-9 mm polyps in the ascending and cecum with biopsy revealing tubular adenomas. Due for repeat colonoscopy in 2027.   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   OV 08/09/22. Reflux with raw vegetables, fried foods, salads and sweets. Issues also with tomato products. Recently increased omeprazole  to 20 mg twice daily on most days. Rare nausea. Taking fiber supplements and stool softener daily. Stools daily or every other day. Feels better if doesn't overeat. Advised to increase omeprazole  to 40 mg BID. GERD diet/lifestyle modifications reinforced. Trial wean of stool softener given controlled Bms. F/u in 4 months.    OV 11/08/22. Having a daily bowel movement for the most part, occasionally skipping a day.  Had recently stop stool softeners.  Taking Beano for gas about twice per day.  Takes 2 spoonfuls of fiber every morning.  Denied any melena or BRBPR.  Taking omeprazole  twice daily and still having some occasional reflux with raw vegetables.  Denied any nausea, vomiting, or dysphagia.  Advised to continue daily fiber  supplement.  Continue omeprazole  40 mg daily and take Pepcid twice daily as needed for breakthrough.  Continue Gas-X.   OV 06/20/23.Straining and BM every 2-3 days. Tried miralax  once daily in the past but only for couple of weeks. Taking metamucil.Taking omeprazole  once daily and pepcid in the evening. Worsened symptoms in the evening. Trying to avoid triggers. Tries to eat before 5pm. Advised trulance  trial, fiber, gas ex and increase PPI to BID.  Trulance  was not covered therefore Linzess  sent in.    OV 09/26/23.  Denied any GI bleeding or abdominal pain.  Having a bowel movement morning after breakfast and her Linzess .  Is having some occasional gas pains with feeling like she has to go.  Denied any nausea or vomiting or dysphagia.  Reflux improved with twice daily dosing, takes Pepcid occasionally.  If she has some dietary indiscretion she will take the Pepcid. Continue linzess  145 mcg and discussed possible trial of 72 mcg. Continue omeprazole  40 mg BID, pepcid as needed. Continue metamucil and GERD diet.    OV 04/17/24.  Reporting some diarrhea and sometimes regular but denies any significant urgency.  She is not taking medication every day that she is not able to go when she does go and is for lots.  If she is taking the 145 daily she reports some low energy and can tell a difference in how she feels.  Also having some gassiness occasionally.  Going to every other day with the Linzess  is making gas pains better but then not going every day.  If going out she does not take it either given she goes more often.  Denies any overt abdominal pain, nausea, vomiting, melena, or BRBPR.  Reflux improved with taking omeprazole  and making dietary changes.  Will take the Pepcid every once in a while.  Also taking her fiber and exercising more.  Advised to decrease her Linzess  to 72 mcg and provided samples.  Advised her to let us  know if effective that way we can send in prescription.  Continue omeprazole  40 mg twice  daily and Pepcid 20 mg nightly as needed.  Trial of FDgard and continue Metamucil daily.  Discussed IBgard, Gas-X, or Phazyme for bloating if no improvement with FDgard.  Follow-up 3 months.  Last office visit 07/05/24.  Ongoing constipation issues, Linzess  145 mcg too strong and adjusted to 72 recommended samples but the lower dose helps her have regular bowel movements that sometimes go 2-3 days without a bowel movement and she does not take Linzess .  Using IBgard and FDgard which she found beneficial.  If she takes this with insufficient water  intake and that she may have some reflux symptoms.  Taking omeprazole  regularly and likes this.  No dysphagia.  Having some left-sided gas and bloating that is associated with her constipation.  She does admit to chewing gum which does increase her gas.  Having hemorrhoids causing itching and occasional pain but denies any bleeding and using over-the-counter hydrocortisone  for relief.  No melena or bright red blood PR.  Prescription for Linzess  72 mcg sent in for her, avoid straining and continue to use hemorrhoid cream.  Discussed potential hemorrhoid banding.  Continue omeprazole  and use IBgard or FDgard consider Pepcid for severe reflux symptoms if needed.   Today:  Discussed the use of AI scribe software for clinical note transcription with the patient, who gave verbal consent to proceed.  Constipation has improved with Linzess , with preference for the 72 mcg dose daily over alternating with the 145 mcg dose due to less urgency. Daily bowel movements occur when taking Linzess , though doses are occasionally missed, primarily when leaving the house. Skipping a dose can result in multiple days without a bowel movement and difficulty resuming regularity. Metamucil is also used. Morning dosing of Linzess  is preferred, but she is open to later dosing if needed. Gas occurs with Linzess  at times, improved by FD Guard or IB Guard. Water  intake is monitored using a  16-ounce container though consistency is uncertain.  She feels as though she is drinking enough but is not sure.  Omeprazole  is taken twice daily for reflux, with occasional use of Pepcid, especially after overeating. Reflux symptoms are generally controlled but worsen with raw vegetables and fried foods. IB Guard or FD Guard help with gas, though capsules are difficult to swallow at times without sufficient water . No significant worsening of reflux is reported.  Hemorrhoids cause intermittent itching, managed with topical cream a couple of times per week. No burning, bleeding, or prolapse is noted.  Occasional dizziness occurs, particularly when sitting up quickly after lying down.      Wt Readings from Last 6 Encounters:  10/25/24 209 lb 6.4 oz (95 kg)  07/05/24 208 lb 3.2 oz (94.4 kg)  04/17/24 204 lb (92.5 kg)  12/08/23 208 lb (94.3 kg)  09/30/23 207 lb (93.9 kg)  09/26/23 208 lb 12.8 oz (94.7 kg)    Body mass index is 29.21 kg/m.   Current Outpatient Medications  Medication Sig Dispense Refill   acetaminophen  (TYLENOL ) 650 MG CR tablet Take 1,300 mg by mouth in the morning and at bedtime.  aspirin  EC 81 MG tablet Take 81 mg by mouth daily. Swallow whole.     Calcium  Carb-Cholecalciferol  (CALCIUM  500 + D PO) Take 1 tablet by mouth daily.     cholecalciferol  (VITAMIN D3) 25 MCG (1000 UT) tablet Take 1,000 Units by mouth in the morning.     cycloSPORINE (RESTASIS) 0.05 % ophthalmic emulsion 1 drop 2 (two) times daily.     ezetimibe  (ZETIA ) 10 MG tablet Take 1 tablet (10 mg total) by mouth daily. 90 tablet 3   famotidine (PEPCID) 20 MG tablet Take 20 mg by mouth every evening. (Patient taking differently: Take 20 mg by mouth every evening. PRN)     fluticasone  (FLONASE ) 50 MCG/ACT nasal spray Place 1 spray into both nostrils 2 (two) times daily. 16 g 2   hydrALAZINE  (APRESOLINE ) 50 MG tablet Take 1 tablet (50 mg total) by mouth 3 (three) times daily. 270 tablet 3    hydrochlorothiazide  (HYDRODIURIL ) 25 MG tablet Take 25 mg by mouth in the morning.     hydrocortisone  (ANUSOL -HC) 2.5 % rectal cream Place 1 Application rectally 2 (two) times daily. Apply internally and externally for 1 week then use as needed. 30 g 1   linaclotide  (LINZESS ) 72 MCG capsule Take 1 capsule (72 mcg total) by mouth daily before breakfast. 90 capsule 3   losartan  (COZAAR ) 100 MG tablet Take 100 mg by mouth in the morning.     Menthol , Topical Analgesic, (BIOFREEZE) 10 % CREA Apply 1 Application topically daily as needed (pain). Sometimes use roll-on     montelukast  (SINGULAIR ) 10 MG tablet Take 10 mg by mouth at bedtime.     Multiple Vitamin (MULTIVITAMIN WITH MINERALS) TABS tablet Take 1 tablet by mouth in the morning.     omeprazole  (PRILOSEC) 40 MG capsule Take 1 capsule (40 mg total) by mouth 2 (two) times daily before a meal. 180 capsule 3   Potassium 99 MG TABS Take 99 mg by mouth in the morning.     rosuvastatin  (CRESTOR ) 40 MG tablet Take 1 tablet (40 mg total) by mouth daily. 90 tablet 3   traZODone (DESYREL) 50 MG tablet Take 50 mg by mouth at bedtime.     verapamil  (CALAN -SR) 240 MG CR tablet Take 240 mg by mouth at bedtime.     No current facility-administered medications for this visit.    Past Medical History:  Diagnosis Date   Acid reflux    Anemia    Arthritis    High cholesterol    Hypertension    LBBB (left bundle branch block)    PONV (postoperative nausea and vomiting)    Shingles     Past Surgical History:  Procedure Laterality Date   ABDOMINAL HYSTERECTOMY  1995   BACK SURGERY  2014   L4-L5   BALLOON DILATION N/A 09/11/2021   Procedure: BALLOON DILATION;  Surgeon: Cindie Carlin POUR, DO;  Location: [facility];  Service: Endoscopy;  Laterality: N/A;   BIOPSY  09/11/2021   Procedure: BIOPSY;  Surgeon: Cindie Carlin POUR, DO;  Location: [facility];  Service: Endoscopy;;   CATARACT EXTRACTION W/PHACO Right 05/18/2019   Procedure: CATARACT  EXTRACTION PHACO AND INTRAOCULAR LENS PLACEMENT (IOC);  Surgeon: Harrie Agent, MD;  Location: [facility];  Service: Ophthalmology;  Laterality: Right;  CDE: 6.76   CATARACT EXTRACTION W/PHACO Left 06/01/2019   Procedure: CATARACT EXTRACTION PHACO AND INTRAOCULAR LENS PLACEMENT (IOC);  Surgeon: Harrie Agent, MD;  Location: [facility];  Service: Ophthalmology;  Laterality: Left;  left, CDE: 8.69   CHOLECYSTECTOMY  2000   COLONOSCOPY  05/08/2009   SLF:A few scattered diverticula throughout the entire colon.  Small  internal hemorrhoids.  Otherwise, no polyps, masses, inflammatory changes, or AVMs seen.  Normal retroflexed view of the rectum   COLONOSCOPY N/A 06/03/2014   SLF:  1. six colon polyps removed. 2. Moderate sized internal hemorrhoids. 3. The left colon is redundant 4.  Moderate diverticulosis throughout the entire examed colon. next TCS in10 years with overtube.   COLONOSCOPY WITH PROPOFOL  N/A 09/11/2021   Procedure: COLONOSCOPY WITH PROPOFOL ;  Surgeon: Cindie Carlin POUR, DO;  Location: [facility];  Service: Endoscopy;  Laterality: N/A;  10:45am   ESOPHAGOGASTRODUODENOSCOPY N/A 06/03/2014   SLF:  1. small hiatal hernia   ESOPHAGOGASTRODUODENOSCOPY N/A 02/16/2016   slf: gastritis, schazki ring   ESOPHAGOGASTRODUODENOSCOPY (EGD) WITH PROPOFOL  N/A 09/11/2021   Procedure: ESOPHAGOGASTRODUODENOSCOPY (EGD) WITH PROPOFOL ;  Surgeon: Cindie Carlin POUR, DO;  Location: [facility];  Service: Endoscopy;  Laterality: N/A;   GIVENS CAPSULE STUDY N/A 03/19/2016   Procedure: GIVENS CAPSULE STUDY;  Surgeon: Margo LITTIE Haddock, MD;  Location: [facility];  Service: Endoscopy;  Laterality: N/A;  0700   TOE ARTHROPLASTY Right 04/20/2023   Procedure: DEROTATIONAL ARHTHROPLASTY OF RIGHT 5TH TOE;  Surgeon: Tobie Buckles, DPM;  Location: [facility];  Service: Podiatry;  Laterality: Right;   TOTAL HIP ARTHROPLASTY Left 04/30/2022   Procedure: LEFT TOTAL HIP ARTHROPLASTY ANTERIOR APPROACH;  Surgeon: Vernetta Lonni GRADE, MD;  Location: [facility];  Service: Orthopedics;  Laterality: Left;   VASCULAR SURGERY  1992   varicose veins    Family History  Problem Relation Age of Onset   Heart disease Sister    Colon cancer Neg Hx    Liver disease Neg Hx     Allergies as of 10/25/2024 - Review Complete 10/25/2024  Allergen Reaction Noted   Anesthetics, ester Nausea And Vomiting 04/22/2022   Codeine Nausea And Vomiting 04/22/2014   Penicillins  11/30/2011    Social History   Socioeconomic History   Marital status: Widowed    Spouse name: Not on file   Number of children: 2   Years of education: Not on file   Highest education level: Not on file  Occupational History   Not on file  Tobacco Use   Smoking status: Never   Smokeless tobacco: Never   Tobacco comments:    Never smoked  Vaping Use   Vaping status: Never Used  Substance and Sexual Activity   Alcohol  use: No    Alcohol /week: 0.0 standard drinks of alcohol    Drug use: No   Sexual activity: Not Currently  Other Topics Concern   Not on file  Social History Narrative   Not on file   Social Drivers of Health   Tobacco Use: Low Risk (10/25/2024)   Patient History    Smoking Tobacco Use: Never    Smokeless Tobacco Use: Never    Passive Exposure: Not on file  Financial Resource Strain: Not on file  Food Insecurity: Not on file  Transportation Needs: Not on file  Physical Activity: Not on file  Stress: Not on file  Social Connections: Not on file  Depression (EYV7-0): Not on file  Alcohol  Screen: Not on file  Housing: Not on file  Utilities: Not on file  Health Literacy: Not on file    Review of Systems   Gen: Denies fever, chills, anorexia. Denies fatigue, weakness, weight loss.  CV: Denies chest  pain, palpitations, syncope, peripheral edema, and claudication. Resp: Denies dyspnea at rest, cough, wheezing, coughing up blood, and pleurisy. GI: See HPI Derm: Denies rash, itching, dry skin Psych: Denies  depression, anxiety, memory loss, confusion. No homicidal or suicidal ideation.  Heme: Denies bruising, bleeding, and enlarged lymph nodes.  Physical Exam   BP 138/75 (BP Location: Right Arm, Patient Position: Sitting, Cuff Size: Large)   Pulse 69   Temp (!) 97.5 F (36.4 C) (Temporal)   Ht 5' 11 (1.803 m)   Wt 209 lb 6.4 oz (95 kg)   BMI 29.21 kg/m   General:   Alert and oriented. No distress noted. Pleasant and cooperative.  Head:  Normocephalic and atraumatic. Eyes:  Conjuctiva clear without scleral icterus. Mouth:  Oral mucosa pink and moist. Good dentition. No lesions. Lungs:  Clear to auscultation bilaterally. No wheezes, rales, or rhonchi. No distress.  Heart:  S1, S2 present without murmurs appreciated.  Abdomen:  +BS, soft, non-distended.  Mild TTP to left lower quadrant and LUQ.  No rebound or guarding. No HSM or masses noted. Rectal: deferred Msk:  Symmetrical without gross deformities. Normal posture. Extremities:  Without edema. Neurologic:  Alert and  oriented x4 Psych:  Alert and cooperative. Normal mood and affect.  Assessment & Plan  Jamie Hunt is a 74 y.o. female presenting today for follow-up of constipation and reflux.    Chronic constipation Chronic constipation is well-controlled on Linzess  72 mcg daily, with occasional missed doses due to concern about urgency. She tolerates the regimen with mild flatulence, and hydration may be suboptimal. - Encouraged daily use of Linzess  with flexible timing, advising administration at least 2 hours after eating or 30 minutes before eating if taking in the afternoon. - Advised Linzess  may be taken with other medications if needed, preferably should be taken in the morning first thing prior to breakfast. - Discussed option to substitute Miralax  on days when urgency is a concern and Linzess  is withheld. - Instructed that if a dose of Linzess  is missed, two capsules may be taken the following day if needed for symptom  control if not taken later in the day. - Reinforced importance of adequate hydration, recommending approximately 64 ounces of water  daily unless contraindicated. - Discussed that Linzess  may cause flatulence and that continued use of FD Guard/IB Guard is appropriate for gas symptoms. - Reviewed that Metamucil and Miralax  may be used together as needed. - Provided anticipatory guidance regarding signs of overhydration (hyponatremia), including dizziness, lightheadedness, and vision changes, and advised monitoring for these symptoms.  Gastroesophageal reflux disease Gastroesophageal reflux disease is well-controlled on omeprazole  40 mg twice daily, with intermittent use of famotidine for dietary indiscretion. Symptoms are generally controlled except with known dietary triggers. - Continue omeprazole  40 mg twice daily - Approved as-needed use of famotidine for breakthrough symptoms, particularly after dietary triggers. - Reinforced dietary modifications to avoid known triggers, including raw vegetables, fried foods, and overeating. - Discussed that if breakthrough symptoms become more frequent or persistent, a change in reflux medication may be considered.  Hemorrhoids Hemorrhoids are minimally symptomatic, with occasional pruritus managed by topical cream. - Advised continued use of topical cream as needed for pruritus. - Reassured regarding current management and stability of condition.      Follow up   Follow up 6 months.    Charmaine Melia, MSN, FNP-BC, AGACNP-BC [facility] "

## 2024-12-07 ENCOUNTER — Ambulatory Visit: Admitting: Student
